# Patient Record
Sex: Female | Born: 1970 | Race: White | Hispanic: No | Marital: Married | Smoking: Current every day smoker
Health system: Southern US, Community
[De-identification: ages and names within clinical notes are randomized; demographics above are authoritative.]

## PROBLEM LIST (undated history)

## (undated) DIAGNOSIS — K3184 Gastroparesis: Secondary | ICD-10-CM

## (undated) DIAGNOSIS — M47816 Spondylosis without myelopathy or radiculopathy, lumbar region: Secondary | ICD-10-CM

## (undated) DIAGNOSIS — F411 Generalized anxiety disorder: Secondary | ICD-10-CM

## (undated) DIAGNOSIS — N879 Dysplasia of cervix uteri, unspecified: Secondary | ICD-10-CM

## (undated) DIAGNOSIS — F329 Major depressive disorder, single episode, unspecified: Secondary | ICD-10-CM

## (undated) DIAGNOSIS — K760 Fatty (change of) liver, not elsewhere classified: Secondary | ICD-10-CM

## (undated) DIAGNOSIS — R7303 Prediabetes: Secondary | ICD-10-CM

## (undated) DIAGNOSIS — F319 Bipolar disorder, unspecified: Secondary | ICD-10-CM

## (undated) DIAGNOSIS — Z87410 Personal history of cervical dysplasia: Secondary | ICD-10-CM

## (undated) DIAGNOSIS — N393 Stress incontinence (female) (male): Secondary | ICD-10-CM

## (undated) DIAGNOSIS — K912 Postsurgical malabsorption, not elsewhere classified: Secondary | ICD-10-CM

## (undated) DIAGNOSIS — K219 Gastro-esophageal reflux disease without esophagitis: Secondary | ICD-10-CM

## (undated) DIAGNOSIS — E559 Vitamin D deficiency, unspecified: Secondary | ICD-10-CM

## (undated) DIAGNOSIS — G5600 Carpal tunnel syndrome, unspecified upper limb: Secondary | ICD-10-CM

## (undated) DIAGNOSIS — E119 Type 2 diabetes mellitus without complications: Secondary | ICD-10-CM

## (undated) DIAGNOSIS — L309 Dermatitis, unspecified: Secondary | ICD-10-CM

## (undated) DIAGNOSIS — F419 Anxiety disorder, unspecified: Secondary | ICD-10-CM

## (undated) DIAGNOSIS — E288 Other ovarian dysfunction: Secondary | ICD-10-CM

## (undated) DIAGNOSIS — F321 Major depressive disorder, single episode, moderate: Secondary | ICD-10-CM

## (undated) DIAGNOSIS — K2270 Barrett's esophagus without dysplasia: Secondary | ICD-10-CM

## (undated) DIAGNOSIS — G43909 Migraine, unspecified, not intractable, without status migrainosus: Secondary | ICD-10-CM

## (undated) DIAGNOSIS — G4733 Obstructive sleep apnea (adult) (pediatric): Secondary | ICD-10-CM

## (undated) DIAGNOSIS — Z973 Presence of spectacles and contact lenses: Secondary | ICD-10-CM

## (undated) DIAGNOSIS — F603 Borderline personality disorder: Secondary | ICD-10-CM

## (undated) DIAGNOSIS — E039 Hypothyroidism, unspecified: Secondary | ICD-10-CM

## (undated) DIAGNOSIS — F3181 Bipolar II disorder: Secondary | ICD-10-CM

## (undated) DIAGNOSIS — M7071 Other bursitis of hip, right hip: Secondary | ICD-10-CM

## (undated) DIAGNOSIS — E2839 Other primary ovarian failure: Secondary | ICD-10-CM

## (undated) DIAGNOSIS — Z87412 Personal history of vulvar dysplasia: Secondary | ICD-10-CM

## (undated) DIAGNOSIS — M5136 Other intervertebral disc degeneration, lumbar region: Secondary | ICD-10-CM

## (undated) DIAGNOSIS — F4310 Post-traumatic stress disorder, unspecified: Secondary | ICD-10-CM

## (undated) HISTORY — DX: Migraine, unspecified, not intractable, without status migrainosus: G43.909

## (undated) HISTORY — DX: Barrett's esophagus without dysplasia: K22.70

## (undated) HISTORY — PX: CARPAL TUNNEL RELEASE: SHX101

## (undated) HISTORY — PX: STOMACH SURGERY: SHX791

## (undated) HISTORY — PX: LAPAROSCOPIC GASTRIC BAND REMOVAL WITH LAPAROSCOPIC GASTRIC SLEEVE RESECTION: SHX6498

## (undated) HISTORY — DX: Post-traumatic stress disorder, unspecified: F43.10

## (undated) HISTORY — DX: Carpal tunnel syndrome, unspecified upper limb: G56.00

## (undated) HISTORY — DX: Gastroparesis: K31.84

---

## 2004-11-21 ENCOUNTER — Emergency Department (HOSPITAL_COMMUNITY): Admission: EM | Admit: 2004-11-21 | Discharge: 2004-11-21 | Payer: Self-pay | Admitting: Emergency Medicine

## 2004-11-23 ENCOUNTER — Emergency Department: Payer: Self-pay | Admitting: Emergency Medicine

## 2004-12-07 ENCOUNTER — Emergency Department: Payer: Self-pay | Admitting: Emergency Medicine

## 2005-08-27 ENCOUNTER — Other Ambulatory Visit: Admission: RE | Admit: 2005-08-27 | Discharge: 2005-08-27 | Payer: Self-pay | Admitting: Family Medicine

## 2006-01-01 ENCOUNTER — Other Ambulatory Visit (HOSPITAL_COMMUNITY): Admission: RE | Admit: 2006-01-01 | Discharge: 2006-04-01 | Payer: Self-pay | Admitting: Psychiatry

## 2006-01-05 ENCOUNTER — Ambulatory Visit: Payer: Self-pay | Admitting: Psychiatry

## 2006-02-14 ENCOUNTER — Emergency Department: Payer: Self-pay | Admitting: Emergency Medicine

## 2006-02-24 ENCOUNTER — Other Ambulatory Visit: Admission: RE | Admit: 2006-02-24 | Discharge: 2006-02-24 | Payer: Self-pay | Admitting: Family Medicine

## 2006-03-14 ENCOUNTER — Emergency Department: Payer: Self-pay | Admitting: Emergency Medicine

## 2006-03-28 ENCOUNTER — Emergency Department: Payer: Self-pay | Admitting: Emergency Medicine

## 2006-03-30 ENCOUNTER — Emergency Department: Payer: Self-pay | Admitting: Emergency Medicine

## 2006-03-31 HISTORY — PX: OTHER SURGICAL HISTORY: SHX169

## 2006-06-25 ENCOUNTER — Other Ambulatory Visit (HOSPITAL_COMMUNITY): Admission: RE | Admit: 2006-06-25 | Discharge: 2006-09-23 | Payer: Self-pay | Admitting: Psychiatry

## 2006-06-29 ENCOUNTER — Ambulatory Visit: Payer: Self-pay | Admitting: Psychiatry

## 2006-08-03 ENCOUNTER — Other Ambulatory Visit: Admission: RE | Admit: 2006-08-03 | Discharge: 2006-08-03 | Payer: Self-pay | Admitting: Family Medicine

## 2006-08-20 ENCOUNTER — Ambulatory Visit (HOSPITAL_COMMUNITY): Admission: RE | Admit: 2006-08-20 | Discharge: 2006-08-20 | Payer: Self-pay | Admitting: Family Medicine

## 2006-10-26 ENCOUNTER — Other Ambulatory Visit (HOSPITAL_COMMUNITY): Admission: RE | Admit: 2006-10-26 | Discharge: 2006-11-11 | Payer: Self-pay | Admitting: Psychiatry

## 2006-10-27 ENCOUNTER — Ambulatory Visit: Payer: Self-pay | Admitting: Psychiatry

## 2006-11-16 ENCOUNTER — Emergency Department: Payer: Self-pay | Admitting: Emergency Medicine

## 2006-11-27 ENCOUNTER — Emergency Department: Payer: Self-pay | Admitting: Internal Medicine

## 2006-12-08 ENCOUNTER — Emergency Department: Payer: Self-pay | Admitting: Emergency Medicine

## 2007-01-03 ENCOUNTER — Emergency Department: Payer: Self-pay | Admitting: Emergency Medicine

## 2007-02-23 ENCOUNTER — Other Ambulatory Visit: Admission: RE | Admit: 2007-02-23 | Discharge: 2007-02-23 | Payer: Self-pay | Admitting: Family Medicine

## 2007-07-23 ENCOUNTER — Emergency Department: Payer: Self-pay | Admitting: Emergency Medicine

## 2007-07-24 ENCOUNTER — Emergency Department: Payer: Self-pay | Admitting: Emergency Medicine

## 2007-08-24 ENCOUNTER — Other Ambulatory Visit: Admission: RE | Admit: 2007-08-24 | Discharge: 2007-08-24 | Payer: Self-pay | Admitting: Family Medicine

## 2007-08-27 ENCOUNTER — Emergency Department (HOSPITAL_COMMUNITY): Admission: EM | Admit: 2007-08-27 | Discharge: 2007-08-27 | Payer: Self-pay | Admitting: Emergency Medicine

## 2007-09-05 ENCOUNTER — Encounter: Admission: RE | Admit: 2007-09-05 | Discharge: 2007-09-05 | Payer: Self-pay | Admitting: Family Medicine

## 2008-01-09 ENCOUNTER — Emergency Department: Payer: Self-pay | Admitting: Emergency Medicine

## 2008-02-28 ENCOUNTER — Other Ambulatory Visit: Admission: RE | Admit: 2008-02-28 | Discharge: 2008-02-28 | Payer: Self-pay | Admitting: Family Medicine

## 2008-07-22 ENCOUNTER — Emergency Department: Payer: Self-pay | Admitting: Emergency Medicine

## 2008-10-11 ENCOUNTER — Other Ambulatory Visit: Admission: RE | Admit: 2008-10-11 | Discharge: 2008-10-11 | Payer: Self-pay | Admitting: Obstetrics and Gynecology

## 2008-11-30 ENCOUNTER — Emergency Department (HOSPITAL_COMMUNITY): Admission: EM | Admit: 2008-11-30 | Discharge: 2008-12-01 | Payer: Self-pay | Admitting: Emergency Medicine

## 2009-03-31 HISTORY — PX: CHOLECYSTECTOMY: SHX55

## 2009-04-12 ENCOUNTER — Other Ambulatory Visit: Admission: RE | Admit: 2009-04-12 | Discharge: 2009-04-12 | Payer: Self-pay | Admitting: Obstetrics and Gynecology

## 2009-04-13 ENCOUNTER — Ambulatory Visit: Payer: Self-pay | Admitting: Psychology

## 2009-04-23 ENCOUNTER — Encounter: Admission: RE | Admit: 2009-04-23 | Discharge: 2009-04-23 | Payer: Self-pay | Admitting: Family Medicine

## 2009-06-26 ENCOUNTER — Emergency Department: Payer: Self-pay | Admitting: Emergency Medicine

## 2009-10-08 ENCOUNTER — Emergency Department: Payer: Self-pay | Admitting: Emergency Medicine

## 2009-12-17 ENCOUNTER — Emergency Department: Payer: Self-pay | Admitting: Emergency Medicine

## 2010-01-03 ENCOUNTER — Emergency Department: Payer: Self-pay | Admitting: Emergency Medicine

## 2010-01-24 ENCOUNTER — Other Ambulatory Visit: Admission: RE | Admit: 2010-01-24 | Discharge: 2010-01-24 | Payer: Self-pay | Admitting: Obstetrics and Gynecology

## 2010-02-12 ENCOUNTER — Emergency Department: Payer: Self-pay | Admitting: Emergency Medicine

## 2010-02-27 ENCOUNTER — Ambulatory Visit: Payer: Self-pay | Admitting: Surgery

## 2010-02-28 HISTORY — PX: CHOLECYSTECTOMY, LAPAROSCOPIC: SHX56

## 2010-03-06 ENCOUNTER — Ambulatory Visit: Payer: Self-pay | Admitting: Surgery

## 2010-03-11 ENCOUNTER — Ambulatory Visit: Payer: Self-pay | Admitting: Surgery

## 2010-04-21 ENCOUNTER — Encounter: Payer: Self-pay | Admitting: Family Medicine

## 2010-05-21 ENCOUNTER — Other Ambulatory Visit: Payer: Self-pay | Admitting: Family Medicine

## 2010-05-21 DIAGNOSIS — E049 Nontoxic goiter, unspecified: Secondary | ICD-10-CM

## 2010-05-24 ENCOUNTER — Other Ambulatory Visit: Payer: Self-pay | Admitting: Obstetrics and Gynecology

## 2010-05-24 ENCOUNTER — Other Ambulatory Visit (HOSPITAL_COMMUNITY)
Admission: RE | Admit: 2010-05-24 | Discharge: 2010-05-24 | Disposition: A | Discharge status: Home / Self Care | Payer: Medicare Other | Source: Ambulatory Visit | Attending: Obstetrics and Gynecology | Admitting: Obstetrics and Gynecology

## 2010-05-24 DIAGNOSIS — Z124 Encounter for screening for malignant neoplasm of cervix: Secondary | ICD-10-CM | POA: Insufficient documentation

## 2010-05-24 DIAGNOSIS — R87810 Cervical high risk human papillomavirus (HPV) DNA test positive: Secondary | ICD-10-CM | POA: Insufficient documentation

## 2010-05-27 ENCOUNTER — Ambulatory Visit
Admission: RE | Admit: 2010-05-27 | Discharge: 2010-05-27 | Disposition: A | Discharge status: Home / Self Care | Payer: Medicare Other | Source: Ambulatory Visit | Attending: Family Medicine | Admitting: Family Medicine

## 2010-05-27 DIAGNOSIS — E049 Nontoxic goiter, unspecified: Secondary | ICD-10-CM

## 2010-07-05 LAB — DIFFERENTIAL
Eosinophils Absolute: 0.3 10*3/uL (ref 0.0–0.7)
Eosinophils Relative: 3 % (ref 0–5)
Lymphocytes Relative: 16 % (ref 12–46)
Lymphs Abs: 1.5 10*3/uL (ref 0.7–4.0)
Monocytes Relative: 7 % (ref 3–12)

## 2010-07-05 LAB — RAPID URINE DRUG SCREEN, HOSP PERFORMED
Amphetamines: NOT DETECTED
Benzodiazepines: POSITIVE — AB
Opiates: NOT DETECTED

## 2010-07-05 LAB — POCT PREGNANCY, URINE: Preg Test, Ur: NEGATIVE

## 2010-07-05 LAB — URINALYSIS, ROUTINE W REFLEX MICROSCOPIC
Nitrite: NEGATIVE
Protein, ur: NEGATIVE mg/dL
Specific Gravity, Urine: 1.003 — ABNORMAL LOW (ref 1.005–1.030)
Urobilinogen, UA: 0.2 mg/dL (ref 0.0–1.0)

## 2010-07-05 LAB — BASIC METABOLIC PANEL
BUN: 6 mg/dL (ref 6–23)
CO2: 30 mEq/L (ref 19–32)
Calcium: 8.8 mg/dL (ref 8.4–10.5)
Creatinine, Ser: 0.74 mg/dL (ref 0.4–1.2)
Glucose, Bld: 78 mg/dL (ref 70–99)

## 2010-07-05 LAB — CBC
HCT: 37 % (ref 36.0–46.0)
MCV: 91.2 fL (ref 78.0–100.0)
Platelets: 331 10*3/uL (ref 150–400)

## 2010-07-05 LAB — ETHANOL: Alcohol, Ethyl (B): <5 mg/dL (ref 0–10)

## 2010-07-29 ENCOUNTER — Other Ambulatory Visit: Payer: Self-pay | Admitting: Obstetrics and Gynecology

## 2010-07-30 DIAGNOSIS — K760 Fatty (change of) liver, not elsewhere classified: Secondary | ICD-10-CM

## 2010-07-30 HISTORY — DX: Fatty (change of) liver, not elsewhere classified: K76.0

## 2010-10-16 ENCOUNTER — Encounter (HOSPITAL_COMMUNITY): Payer: Self-pay | Admitting: *Deleted

## 2010-10-17 ENCOUNTER — Other Ambulatory Visit: Payer: Self-pay | Admitting: Obstetrics and Gynecology

## 2010-10-17 ENCOUNTER — Ambulatory Visit (HOSPITAL_COMMUNITY): Payer: Medicare Other | Admitting: Certified Registered Nurse Anesthetist

## 2010-10-17 ENCOUNTER — Encounter (HOSPITAL_COMMUNITY)
Admission: RE | Disposition: A | Discharge status: Home / Self Care | Payer: Self-pay | Source: Ambulatory Visit | Attending: Obstetrics and Gynecology

## 2010-10-17 ENCOUNTER — Ambulatory Visit (HOSPITAL_COMMUNITY)
Admission: RE | Admit: 2010-10-17 | Discharge: 2010-10-17 | Disposition: A | Discharge status: Home / Self Care | Payer: Medicare Other | Source: Ambulatory Visit | Attending: Obstetrics and Gynecology | Admitting: Obstetrics and Gynecology

## 2010-10-17 ENCOUNTER — Encounter (HOSPITAL_COMMUNITY): Payer: Self-pay | Admitting: Certified Registered Nurse Anesthetist

## 2010-10-17 DIAGNOSIS — N950 Postmenopausal bleeding: Secondary | ICD-10-CM | POA: Insufficient documentation

## 2010-10-17 DIAGNOSIS — Z9889 Other specified postprocedural states: Secondary | ICD-10-CM

## 2010-10-17 DIAGNOSIS — E2839 Other primary ovarian failure: Secondary | ICD-10-CM | POA: Insufficient documentation

## 2010-10-17 HISTORY — DX: Gastro-esophageal reflux disease without esophagitis: K21.9

## 2010-10-17 HISTORY — DX: Hypothyroidism, unspecified: E03.9

## 2010-10-17 HISTORY — PX: HYSTEROSCOPY W/D&C: SHX1775

## 2010-10-17 HISTORY — DX: Anxiety disorder, unspecified: F41.9

## 2010-10-17 LAB — URINALYSIS, MICROSCOPIC ONLY
Hgb urine dipstick: NEGATIVE
Ketones, ur: 15 mg/dL — AB
Protein, ur: 100 mg/dL — AB
Specific Gravity, Urine: >1.03 — ABNORMAL HIGH (ref 1.005–1.030)
Urobilinogen, UA: 1 mg/dL (ref 0.0–1.0)

## 2010-10-17 LAB — CBC
MCH: 29.3 pg (ref 26.0–34.0)
MCHC: 33 g/dL (ref 30.0–36.0)
MCV: 88.8 fL (ref 78.0–100.0)
Platelets: 411 10*3/uL — ABNORMAL HIGH (ref 150–400)
WBC: 10.4 10*3/uL (ref 4.0–10.5)

## 2010-10-17 LAB — BASIC METABOLIC PANEL
BUN: 9 mg/dL (ref 6–23)
CO2: 20 mEq/L (ref 19–32)
Calcium: 9.7 mg/dL (ref 8.4–10.5)
Creatinine, Ser: 1.01 mg/dL (ref 0.50–1.10)
GFR calc Af Amer: >60 mL/min (ref >60)

## 2010-10-17 LAB — PREGNANCY, URINE: Preg Test, Ur: NEGATIVE

## 2010-10-17 SURGERY — DILATATION AND CURETTAGE /HYSTEROSCOPY
Anesthesia: Choice

## 2010-10-17 MED ORDER — FENTANYL CITRATE 0.05 MG/ML IJ SOLN
INTRAMUSCULAR | Status: AC
  Administered 2010-10-17: 50 ug via INTRAVENOUS
  Filled 2010-10-17: qty 2

## 2010-10-17 MED ORDER — LACTATED RINGERS IV SOLN
INTRAVENOUS | Status: DC
  Administered 2010-10-17: 13:00:00 via INTRAVENOUS

## 2010-10-17 MED ORDER — IBUPROFEN 600 MG PO TABS: 600.0000 mg | ORAL_TABLET | Freq: Four times a day (QID) | ORAL | Status: AC | PRN

## 2010-10-17 MED ORDER — DEXAMETHASONE SODIUM PHOSPHATE 10 MG/ML IJ SOLN
INTRAMUSCULAR | Status: AC
  Filled 2010-10-17: qty 1

## 2010-10-17 MED ORDER — DEXAMETHASONE SODIUM PHOSPHATE 10 MG/ML IJ SOLN
INTRAMUSCULAR | Status: DC | PRN
  Administered 2010-10-17: 10 mg via INTRAVENOUS

## 2010-10-17 MED ORDER — BUPIVACAINE HCL (PF) 0.25 % IJ SOLN
INTRAMUSCULAR | Status: DC | PRN
  Administered 2010-10-17: 20 mL

## 2010-10-17 MED ORDER — LIDOCAINE HCL (CARDIAC) 20 MG/ML IV SOLN
INTRAVENOUS | Status: AC
  Filled 2010-10-17: qty 5

## 2010-10-17 MED ORDER — PROMETHAZINE HCL 25 MG/ML IJ SOLN: 6.2500 mg | INTRAMUSCULAR | Status: DC | PRN

## 2010-10-17 MED ORDER — MIDAZOLAM HCL 2 MG/2ML IJ SOLN
INTRAMUSCULAR | Status: AC
  Filled 2010-10-17: qty 2

## 2010-10-17 MED ORDER — KETOROLAC TROMETHAMINE 30 MG/ML IJ SOLN
INTRAMUSCULAR | Status: DC | PRN
  Administered 2010-10-17: 30 mg via INTRAVENOUS

## 2010-10-17 MED ORDER — ACETAMINOPHEN 325 MG PO TABS: 325.0000 mg | ORAL_TABLET | ORAL | Status: DC | PRN

## 2010-10-17 MED ORDER — GLYCINE 1.5 % IR SOLN
Status: DC | PRN
  Administered 2010-10-17: 3000 mL

## 2010-10-17 MED ORDER — ONDANSETRON HCL 4 MG/2ML IJ SOLN
INTRAMUSCULAR | Status: DC | PRN
  Administered 2010-10-17: 4 mg via INTRAVENOUS

## 2010-10-17 MED ORDER — ACETAMINOPHEN 10 MG/ML IV SOLN: 1000.0000 mg | Freq: Once | INTRAVENOUS | Status: DC | PRN

## 2010-10-17 MED ORDER — FENTANYL CITRATE 0.05 MG/ML IJ SOLN
INTRAMUSCULAR | Status: AC
  Filled 2010-10-17: qty 2

## 2010-10-17 MED ORDER — PROPOFOL 10 MG/ML IV EMUL
INTRAVENOUS | Status: AC
Start: 2010-10-17 — End: 2010-10-17
  Filled 2010-10-17: qty 20

## 2010-10-17 MED ORDER — PROPOFOL 10 MG/ML IV EMUL
INTRAVENOUS | Status: DC | PRN
  Administered 2010-10-17: 200 mg via INTRAVENOUS

## 2010-10-17 MED ORDER — FENTANYL CITRATE 0.05 MG/ML IJ SOLN
50.0000 ug | INTRAMUSCULAR | Status: DC | PRN
  Administered 2010-10-17: 50 ug via INTRAVENOUS

## 2010-10-17 MED ORDER — MIDAZOLAM HCL 5 MG/5ML IJ SOLN
INTRAMUSCULAR | Status: DC | PRN
  Administered 2010-10-17: 2 mg via INTRAVENOUS

## 2010-10-17 MED ORDER — LIDOCAINE HCL (CARDIAC) 20 MG/ML IV SOLN
INTRAVENOUS | Status: DC | PRN
  Administered 2010-10-17: 100 mg via INTRAVENOUS

## 2010-10-17 MED ORDER — HYDROMORPHONE HCL 1 MG/ML IJ SOLN
INTRAMUSCULAR | Status: AC
  Filled 2010-10-17: qty 1

## 2010-10-17 MED ORDER — FENTANYL CITRATE 0.05 MG/ML IJ SOLN
INTRAMUSCULAR | Status: DC | PRN
  Administered 2010-10-17: 100 ug via INTRAVENOUS

## 2010-10-17 MED ORDER — ONDANSETRON HCL 4 MG/2ML IJ SOLN
INTRAMUSCULAR | Status: AC
Start: 2010-10-17 — End: 2010-10-17
  Filled 2010-10-17: qty 2

## 2010-10-17 MED ORDER — FAMOTIDINE IN NACL 20-0.9 MG/50ML-% IV SOLN
20.0000 mg | Freq: Two times a day (BID) | INTRAVENOUS | Status: DC
  Administered 2010-10-17: 20 mg via INTRAVENOUS
  Filled 2010-10-17 (×3): qty 50

## 2010-10-17 SURGICAL SUPPLY — 20 items
CANISTER SUCTION 2500CC (MISCELLANEOUS) ×2 IMPLANT
CATH FOLEY 2WAY  5CC 16FR SIL (CATHETERS) ×1
CATH FOLEY 2WAY 5CC 16FR SIL (CATHETERS) ×1 IMPLANT
CATH ROBINSON RED A/P 16FR (CATHETERS) IMPLANT
CLOTH BEACON ORANGE TIMEOUT ST (SAFETY) ×2 IMPLANT
CONTAINER PREFILL 10% NBF 60ML (FORM) ×2 IMPLANT
DRAPE UTILITY XL STRL (DRAPES) ×2 IMPLANT
ELECT REM PT RETURN 9FT ADLT (ELECTROSURGICAL) ×2
ELECTRODE REM PT RTRN 9FT ADLT (ELECTROSURGICAL) ×1 IMPLANT
ELECTRODE ROLLER BARREL 22FR (ELECTROSURGICAL) IMPLANT
ELECTRODE VAPORCUT 22FR (ELECTROSURGICAL) IMPLANT
GLOVE BIOGEL M 6.5 STRL (GLOVE) ×2 IMPLANT
GLOVE BIOGEL PI IND STRL 6.5 (GLOVE) ×2 IMPLANT
GLOVE BIOGEL PI INDICATOR 6.5 (GLOVE) ×2
GOWN BRE IMP SLV AUR LG STRL (GOWN DISPOSABLE) ×2 IMPLANT
GOWN BRE IMP SLV AUR XL STRL (GOWN DISPOSABLE) ×2 IMPLANT
LOOP ANGLED CUTTING 22FR (CUTTING LOOP) IMPLANT
PACK HYSTEROSCOPY LF (CUSTOM PROCEDURE TRAY) ×2 IMPLANT
TOWEL OR 17X24 6PK STRL BLUE (TOWEL DISPOSABLE) ×4 IMPLANT
WATER STERILE IRR 1000ML POUR (IV SOLUTION) IMPLANT

## 2010-10-17 NOTE — Op Note (Signed)
Date of surgery 10/17/2010  Procedure hysteroscopy dilation and curettage Preoperative diagnoses #1 postmenopausal bleeding #2 premature ovarian failure Postoperative diagnoses #1 same Surgeon Dr. Richardson Dopp Asst. none Anesthesia Gen. Findings normal-appearing endometrial cavity no masses noted Specimen endometrial curettings Disposition of specimen pathology  Procedure Patient was taken to the operating room. Where she was placed under general anesthesia. She was prepped and draped in the usual sterile fashion. A speculum was placed into the vaginal vault. The anterior lip of the cervix was grasped with a single-tooth tenaculum. The uterus was then sounded to 7.5 cm. The cervix was then dilated to approximately 6 mm. Diagnostic hysteroscope was inserted. The findings on above were noted. The hysteroscope was removed. A sharp curet was introduced and a sharp curettage was performed until a gritty texture was noted. Hysteroscope was then reintroduced. There was no evidence of perforation. Hysteroscope was removed. The speculum was removed. Excellent hemostasis was noted. Patient was awakened from anesthesia and taken to the recovery room in stable condition.  Postoperative counts were correct x2.  Complications none Glycine deficit 75 cc Estimated blood loss minimal Urine output approximately 25 cc in and out catheterized at the beginning of the case

## 2010-10-17 NOTE — Progress Notes (Signed)
Pt complains of chest pressure, sats 100%, sinus rhythm at 87, bp 107/66, pt appears anxious, pt has no cardiac hx, or diabetes, Dr Cristela Blue made aware, no new orders will continue to monitor closely

## 2010-10-17 NOTE — Anesthesia Preprocedure Evaluation (Addendum)
Anesthesia Evaluation  Name, MR# and DOB Patient awake  General Assessment Comment  Reviewed: Allergy & Precautions, H&P , Patient's Chart, lab work & pertinent test results and reviewed documented beta blocker date and time   History of Anesthesia Complications Negative for: history of anesthetic complications  Airway Mallampati: III TM Distance: >3 FB Neck ROM: full    Dental No notable dental hx    Pulmonaryneg pulmonary ROS    clear to auscultation  pulmonary exam normal   Cardiovascular Exercise Tolerance: Good regular Normal   Neuro/Psych (+) {AN ROS/MED HX NEURO HEADACHES (+) PSYCHIATRIC DISORDERS, Anxiety, Depression, Negative Neurological ROS Negative Psych ROS  GI/Hepatic/Renal negative GI ROS, negative Liver ROS, and negative Renal ROS (+)  GERD Medicated and Controlled     Endo/Other  Negative Endocrine ROS (+)  Hypothyroidism, Morbid obesity Abdominal   Musculoskeletal  Hematology negative hematology ROS (+)   Peds  Reproductive/Obstetrics negative OB ROS   Anesthesia Other Findings             Anesthesia Physical Anesthesia Plan  ASA: III  Anesthesia Plan: General   Post-op Pain Management:    Induction:   Airway Management Planned:   Additional Equipment:   Intra-op Plan:   Post-operative Plan:   Informed Consent: I have reviewed the patients History and Physical, chart, labs and discussed the procedure including the risks, benefits and alternatives for the proposed anesthesia with the patient or authorized representative who has indicated his/her understanding and acceptance.   Dental Advisory Given  Plan Discussed with: CRNA and Surgeon  Anesthesia Plan Comments:         Anesthesia Quick Evaluation

## 2010-10-17 NOTE — Transfer of Care (Signed)
Immediate Anesthesia Transfer of Care Note  Patient: Lindsey Nicholson  Procedure(s) Performed:  DILATATION AND CURETTAGE (D&C) /HYSTEROSCOPY  Patient Location: PACU  Anesthesia Type: General  Level of Consciousness: awake, alert  and oriented  Airway & Oxygen Therapy: Patient Spontanous Breathing and Patient connected to nasal cannula oxygen  Post-op Assessment: Report given to PACU RN, Post -op Vital signs reviewed and stable and Patient moving all extremities X 4  Post vital signs: Reviewed and stable  Complications: No apparent anesthesia complications

## 2010-10-17 NOTE — Preoperative (Addendum)
Beta Blockers   Reason not to administer Beta Blockers:Not Applicable, Not ordered pre-op

## 2010-10-17 NOTE — Anesthesia Postprocedure Evaluation (Signed)
  Anesthesia Post-op Note  Patient: Lindsey Nicholson Section  Procedure(s) Performed:  DILATATION AND CURETTAGE (D&C) /HYSTEROSCOPY Patient is awake and responsive. Pain and nausea are reasonably well controlled. Vital signs are stable and clinically acceptable. Oxygen saturation is clinically acceptable. There are no apparent anesthetic complications at this time. Patient is ready for discharge.

## 2010-10-23 ENCOUNTER — Inpatient Hospital Stay (HOSPITAL_COMMUNITY)
Admission: AD | Admit: 2010-10-23 | Discharge: 2010-10-23 | Disposition: A | Discharge status: Home / Self Care | Payer: Medicare Other | Source: Ambulatory Visit | Attending: Obstetrics and Gynecology | Admitting: Obstetrics and Gynecology

## 2010-10-23 ENCOUNTER — Encounter (HOSPITAL_COMMUNITY): Payer: Self-pay

## 2010-10-23 DIAGNOSIS — Z79899 Other long term (current) drug therapy: Secondary | ICD-10-CM | POA: Insufficient documentation

## 2010-10-23 DIAGNOSIS — K5900 Constipation, unspecified: Secondary | ICD-10-CM | POA: Insufficient documentation

## 2010-10-23 DIAGNOSIS — E039 Hypothyroidism, unspecified: Secondary | ICD-10-CM | POA: Insufficient documentation

## 2010-10-23 DIAGNOSIS — K219 Gastro-esophageal reflux disease without esophagitis: Secondary | ICD-10-CM | POA: Insufficient documentation

## 2010-10-23 DIAGNOSIS — F17200 Nicotine dependence, unspecified, uncomplicated: Secondary | ICD-10-CM | POA: Insufficient documentation

## 2010-10-23 DIAGNOSIS — R1031 Right lower quadrant pain: Secondary | ICD-10-CM | POA: Insufficient documentation

## 2010-10-23 DIAGNOSIS — F319 Bipolar disorder, unspecified: Secondary | ICD-10-CM | POA: Insufficient documentation

## 2010-10-23 DIAGNOSIS — R110 Nausea: Secondary | ICD-10-CM | POA: Insufficient documentation

## 2010-10-23 HISTORY — DX: Borderline personality disorder: F60.3

## 2010-10-23 HISTORY — DX: Bipolar disorder, unspecified: F31.9

## 2010-10-23 LAB — CBC
HCT: 40.5 % (ref 36.0–46.0)
MCHC: 33.3 g/dL (ref 30.0–36.0)
Platelets: 311 10*3/uL (ref 150–400)
RDW: 13.3 % (ref 11.5–15.5)

## 2010-10-23 LAB — DIFFERENTIAL
Basophils Absolute: 0 10*3/uL (ref 0.0–0.1)
Basophils Relative: 0 % (ref 0–1)
Eosinophils Relative: 5 % (ref 0–5)
Monocytes Absolute: 0.7 10*3/uL (ref 0.1–1.0)
Neutro Abs: 6.5 10*3/uL (ref 1.7–7.7)

## 2010-10-23 LAB — URINALYSIS, ROUTINE W REFLEX MICROSCOPIC
Glucose, UA: NEGATIVE mg/dL
Leukocytes, UA: NEGATIVE
Protein, ur: NEGATIVE mg/dL
Specific Gravity, Urine: 1.015 (ref 1.005–1.030)
pH: 6 (ref 5.0–8.0)

## 2010-10-23 LAB — POCT PREGNANCY, URINE: Preg Test, Ur: NEGATIVE

## 2010-10-23 LAB — URINE MICROSCOPIC-ADD ON

## 2010-10-23 NOTE — ED Provider Notes (Addendum)
History    patient is a 40 year old white female. She presents today complaining of right lower quadrant pain since her D&C and hysteroscopy on July 19. She states she has not had a bowel movement since that time. She denies fever. She has had some nausea.  Chief Complaint  Patient presents with  . Abdominal Pain   HPI  OB History    Grav Para Term Preterm Abortions TAB SAB Ect Mult Living                  Past Medical History  Diagnosis Date  . Hypothyroidism   . GERD (gastroesophageal reflux disease)     frequently but not on meds  . Headache     has been a long time  . Anxiety   . Depression 2003    bi-polar  . 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 Bipolar 1 disorder   . Borderline personality disorder     Past Surgical History  Procedure Date  . Cholecystectomy 2011  . Fracture surgery 2008    left foot  . Hysteroscopy w/d&c     Family History  Problem Relation Age of Onset  . Cancer Mother   . Mental illness Mother   . Diabetes Father     History  Substance Use Topics  . Smoking status: Current Everyday Smoker -- 0.5 packs/day for 15 years    Types: Cigarettes  . Smokeless tobacco: Not on file  . Alcohol Use: No    Allergies:  Allergies  Allergen Reactions  . Codeine Anaphylaxis  . Hydrocodone Anaphylaxis  . Oxycodone Anaphylaxis  . Trazodone And Nefazodone Other (See Comments)    Blurry vision and then passed out at work, had to go to ER  . Latex Rash    Prescriptions prior to admission  Medication Sig Dispense Refill  . buPROPion (WELLBUTRIN XL) 300 MG 24 hr tablet Take 300 mg by mouth daily.        . calcium carbonate (TUMS EX) 750 MG chewable tablet Chew 1 tablet by mouth daily.        . Cholecalciferol 50000 UNITS capsule Take 50,000 Units by mouth every Wednesday.        . clobetasol (TEMOVATE) 0.05 % ointment Apply 1 application topically 2 (two) times daily.        . diazepam (VALIUM) 10 MG tablet Take  10 mg by mouth every 6 (six) hours as needed. For anxiety.       . DULoxetine (CYMBALTA) 60 MG capsule Take 60 mg by mouth daily.        . Emollient (CERAVE EX) Apply 1 application topically daily.        Marland Kitchen ibuprofen (IBU) 600 MG tablet Take 1 tablet (600 mg total) by mouth every 6 (six) hours as needed for pain.  30 tablet  1  . ketoconazole (NIZORAL) 2 % cream Apply 1 application topically daily.        Marland Kitchen levothyroxine (SYNTHROID, LEVOTHROID) 175 MCG tablet Take 175 mcg by mouth daily.        . polyethylene glycol powder (MIRALAX) powder Take 17 g by mouth once.        Marland Kitchen QUEtiapine (SEROQUEL) 100 MG tablet Take 100 mg by mouth at bedtime.          Review of Systems  Constitutional: Negative for fever and chills.  Gastrointestinal: Positive for nausea and abdominal pain. Negative for vomiting, diarrhea  and constipation.  Genitourinary: Negative for dysuria, urgency, frequency, hematuria and flank pain.  Neurological: Negative for dizziness and headaches.  Psychiatric/Behavioral: Negative for depression and suicidal ideas.   Physical Exam   Blood pressure 134/94, pulse 98, temperature 99.1 F (37.3 C), temperature source Oral, resp. rate 20, SpO2 99.00%.  Physical Exam  Constitutional: She is oriented to person, place, and time. She appears well-developed and well-nourished. No distress.  HENT:  Head: Normocephalic and atraumatic.  Eyes: EOM are normal. Pupils are equal, round, and reactive to light.  GI: Soft. She exhibits no distension. There is tenderness. There is no rebound and no guarding.  Neurological: She is alert and oriented to person, place, and time.  Skin: Skin is warm and dry. She is not diaphoretic.  Psychiatric: She has a normal mood and affect. Her behavior is normal. Judgment and thought content normal.    MAU Course  Procedures  Patient had a soapsuds enema. She had great results.  Results for orders placed during the hospital encounter of 10/23/10 (from the  past 24 hour(s))  URINALYSIS, ROUTINE W REFLEX MICROSCOPIC     Status: Abnormal   Collection Time   10/23/10  8:40 AM      Component Value Range   Color, Urine YELLOW  YELLOW    Appearance CLEAR  CLEAR    Specific Gravity, Urine 1.015  1.005 - 1.030    pH 6.0  5.0 - 8.0    Glucose, UA NEGATIVE  NEGATIVE (mg/dL)   Hgb urine dipstick TRACE (*) NEGATIVE    Bilirubin Urine NEGATIVE  NEGATIVE    Ketones, ur NEGATIVE  NEGATIVE (mg/dL)   Protein, ur NEGATIVE  NEGATIVE (mg/dL)   Urobilinogen, UA 0.2  0.0 - 1.0 (mg/dL)   Nitrite NEGATIVE  NEGATIVE    Leukocytes, UA NEGATIVE  NEGATIVE   URINE MICROSCOPIC-ADD ON     Status: Normal   Collection Time   10/23/10  8:40 AM      Component Value Range   Squamous Epithelial / LPF RARE  RARE    RBC / HPF 0-2  <3 (RBC/hpf)   Bacteria, UA RARE  RARE   POCT PREGNANCY, URINE     Status: Normal   Collection Time   10/23/10  8:50 AM      Component Value Range   Preg Test, Ur NEGATIVE    CBC     Status: Normal   Collection Time   10/23/10 10:04 AM      Component Value Range   WBC 9.4  4.0 - 10.5 (K/uL)   RBC 4.55  3.87 - 5.11 (MIL/uL)   Hemoglobin 13.5  12.0 - 15.0 (g/dL)   HCT 91.4  78.2 - 95.6 (%)   MCV 89.0  78.0 - 100.0 (fL)   MCH 29.7  26.0 - 34.0 (pg)   MCHC 33.3  30.0 - 36.0 (g/dL)   RDW 21.3  08.6 - 57.8 (%)   Platelets 311  150 - 400 (K/uL)  DIFFERENTIAL     Status: Normal   Collection Time   10/23/10 10:04 AM      Component Value Range   Neutrophils Relative 68  43 - 77 (%)   Neutro Abs 6.5  1.7 - 7.7 (K/uL)   Lymphocytes Relative 19  12 - 46 (%)   Lymphs Abs 1.8  0.7 - 4.0 (K/uL)   Monocytes Relative 8  3 - 12 (%)   Monocytes Absolute 0.7  0.1 - 1.0 (K/uL)   Eosinophils  Relative 5  0 - 5 (%)   Eosinophils Absolute 0.4  0.0 - 0.7 (K/uL)   Basophils Relative 0  0 - 1 (%)   Basophils Absolute 0.0  0.0 - 0.1 (K/uL)     Assessment and Plan  Constipation: Discussed with patient at length. She has a followup scheduled with Dr. Richardson Dopp.  Dr. Richardson Dopp actually did see this patient today in the MAU. Did discuss appropriate diet, activities, risks, and precautions. She understood this and agreed.  Clinton Gallant. Lindon Kiel III, DrHSc, MPAS, PA-C  10/23/2010, 12:31 PM

## 2010-10-23 NOTE — Progress Notes (Signed)
UP TO B-ROOM- LARGE RESULTS-  THICK BROWN WATERY

## 2010-10-23 NOTE — Progress Notes (Signed)
AGAIN - LARGE AMTS BROWN LIQ  WATERY  BM

## 2010-10-23 NOTE — Progress Notes (Signed)
PT SAYS SHE HAD SURGERY ON 10-17-2010- AND AFTER PAIN MEDS WORE OFF - HER ABD CONTINUED TO HURT.   DREW LABS TODAY. HAS HAD SMALL BM'S - SOME AT 0300.

## 2010-10-23 NOTE — Progress Notes (Signed)
UP TO B-ROOM- WITH GOOD RESULTS-  POST BS IN LOWER QUADS - NONE IN UPPER QUADS.

## 2010-10-23 NOTE — Progress Notes (Signed)
Pt states she had a D & C with a hysteroscopy on 7-19. Has been having R side sharp pain since surgery. Pt has not had a BM since surgery except for hard balls. Has taken multiple softners without results. Had N/V this am.

## 2010-10-24 ENCOUNTER — Encounter (HOSPITAL_COMMUNITY): Payer: Self-pay

## 2010-10-24 ENCOUNTER — Inpatient Hospital Stay (HOSPITAL_COMMUNITY)
Admission: AD | Admit: 2010-10-24 | Discharge: 2010-10-24 | Disposition: A | Discharge status: Home / Self Care | Payer: Medicare Other | Source: Ambulatory Visit | Attending: Obstetrics and Gynecology | Admitting: Obstetrics and Gynecology

## 2010-10-24 DIAGNOSIS — F411 Generalized anxiety disorder: Secondary | ICD-10-CM | POA: Insufficient documentation

## 2010-10-24 DIAGNOSIS — K219 Gastro-esophageal reflux disease without esophagitis: Secondary | ICD-10-CM | POA: Insufficient documentation

## 2010-10-24 DIAGNOSIS — R109 Unspecified abdominal pain: Secondary | ICD-10-CM

## 2010-10-24 DIAGNOSIS — K5900 Constipation, unspecified: Secondary | ICD-10-CM | POA: Insufficient documentation

## 2010-10-24 DIAGNOSIS — F319 Bipolar disorder, unspecified: Secondary | ICD-10-CM | POA: Insufficient documentation

## 2010-10-24 DIAGNOSIS — R1031 Right lower quadrant pain: Secondary | ICD-10-CM | POA: Insufficient documentation

## 2010-10-24 DIAGNOSIS — Z79899 Other long term (current) drug therapy: Secondary | ICD-10-CM | POA: Insufficient documentation

## 2010-10-24 DIAGNOSIS — E039 Hypothyroidism, unspecified: Secondary | ICD-10-CM | POA: Insufficient documentation

## 2010-10-24 DIAGNOSIS — F17200 Nicotine dependence, unspecified, uncomplicated: Secondary | ICD-10-CM | POA: Insufficient documentation

## 2010-10-24 HISTORY — DX: Other primary ovarian failure: E28.39

## 2010-10-24 HISTORY — DX: Other ovarian dysfunction: E28.8

## 2010-10-24 HISTORY — DX: Fatty (change of) liver, not elsewhere classified: K76.0

## 2010-10-24 LAB — DIFFERENTIAL
Basophils Absolute: 0 10*3/uL (ref 0.0–0.1)
Basophils Relative: 0 % (ref 0–1)
Neutro Abs: 7 10*3/uL (ref 1.7–7.7)
Neutrophils Relative %: 73 % (ref 43–77)

## 2010-10-24 LAB — URINALYSIS, ROUTINE W REFLEX MICROSCOPIC
Leukocytes, UA: NEGATIVE
Nitrite: NEGATIVE
Specific Gravity, Urine: 1.02 (ref 1.005–1.030)
Urobilinogen, UA: 0.2 mg/dL (ref 0.0–1.0)

## 2010-10-24 LAB — URINE MICROSCOPIC-ADD ON

## 2010-10-24 LAB — CBC
Hemoglobin: 13.7 g/dL (ref 12.0–15.0)
MCHC: 33.2 g/dL (ref 30.0–36.0)
RDW: 13.6 % (ref 11.5–15.5)
WBC: 9.7 10*3/uL (ref 4.0–10.5)

## 2010-10-24 NOTE — ED Provider Notes (Signed)
History   Pt presents today c/o continued Rt sided pain. She is 1wk s/p D&C and hysteroscopy. She was seen yesterday in the MAU and was given a soap suds enema which gave her complete relief of her sx. She has an appt today with Dr. Richardson Dopp at 3pm. She also had an [place] earlier today at Dr. Dawayne Patricia office and the [place] tech told her that her pain was not GYN related so the pt decided to come to the MAU without seeing Dr. Richardson Dopp. She denies fever or any other sx.  Chief Complaint  Patient presents with  . Abdominal Pain    RLQ   HPI  OB History    Grav Para Term Preterm Abortions TAB SAB Ect Mult Living                  Past Medical History  Diagnosis Date  . Hypothyroidism   . GERD (gastroesophageal reflux disease)     frequently but not on meds  . Headache     has been a long time  . Anxiety   . Depression 2003    bi-polar  . 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 Bipolar 1 disorder   . Borderline personality disorder     Past Surgical History  Procedure Date  . Cholecystectomy 2011  . Fracture surgery 2008    left foot  . Hysteroscopy w/d&c     Family History  Problem Relation Age of Onset  . Cancer Mother   . Mental illness Mother   . Diabetes Father     History  Substance Use Topics  . Smoking status: Current Everyday Smoker -- 0.5 packs/day for 15 years    Types: Cigarettes  . Smokeless tobacco: Not on file  . Alcohol Use: No    Allergies:  Allergies  Allergen Reactions  . Codeine Anaphylaxis  . Hydrocodone Anaphylaxis  . Oxycodone Anaphylaxis  . Trazodone And Nefazodone Other (See Comments)    Blurry vision and then passed out at work, had to go to ER  . Latex Rash    Prescriptions prior to admission  Medication Sig Dispense Refill  . buPROPion (WELLBUTRIN XL) 300 MG 24 hr tablet Take 300 mg by mouth daily.        . calcium carbonate (TUMS EX) 750 MG chewable tablet Chew 1 tablet by mouth daily.        . Cholecalciferol  50000 UNITS capsule Take 50,000 Units by mouth every Wednesday.        . clobetasol (TEMOVATE) 0.05 % ointment Apply 1 application topically 2 (two) times daily.        . diazepam (VALIUM) 10 MG tablet Take 10 mg by mouth every 6 (six) hours as needed. For anxiety.       . DULoxetine (CYMBALTA) 60 MG capsule Take 60 mg by mouth daily.        . Emollient (CERAVE EX) Apply 1 application topically daily.        Marland Kitchen ibuprofen (IBU) 600 MG tablet Take 1 tablet (600 mg total) by mouth every 6 (six) hours as needed for pain.  30 tablet  1  . ketoconazole (NIZORAL) 2 % cream Apply 1 application topically daily.        Marland Kitchen levothyroxine (SYNTHROID, LEVOTHROID) 175 MCG tablet Take 175 mcg by mouth daily.        . polyethylene glycol powder (MIRALAX) powder Take 17 g by mouth once.        Marland Kitchen  QUEtiapine (SEROQUEL) 100 MG tablet Take 100 mg by mouth at bedtime.          Review of Systems  Constitutional: Negative for fever and chills.  Cardiovascular: Negative for chest pain and palpitations.  Gastrointestinal: Positive for abdominal pain and constipation. Negative for nausea, vomiting and diarrhea.  Genitourinary: Negative for dysuria, urgency, frequency, hematuria and flank pain.  Neurological: Negative for dizziness.  Psychiatric/Behavioral: Negative for depression and suicidal ideas.   Physical Exam   Blood pressure 111/70, pulse 109, temperature 98.7 F (37.1 C), temperature source Oral, resp. rate 20.  Physical Exam  Constitutional: She is oriented to person, place, and time. She appears well-developed and well-nourished. No distress.  HENT:  Head: Normocephalic and atraumatic.  Eyes: EOM are normal. Pupils are equal, round, and reactive to light.  GI: Soft. She exhibits no distension and no mass. There is tenderness. There is no rebound and no guarding.  Neurological: She is alert and oriented to person, place, and time.  Skin: Skin is warm and dry. She is not diaphoretic.  Psychiatric: She has  a normal mood and affect. Her behavior is normal. Judgment and thought content normal.    MAU Course  Procedures  Results for orders placed during the hospital encounter of 10/24/10 (from the past 24 hour(s))  URINALYSIS, ROUTINE W REFLEX MICROSCOPIC     Status: Abnormal   Collection Time   10/24/10  1:25 PM      Component Value Range   Color, Urine YELLOW  YELLOW    Appearance CLEAR  CLEAR    Specific Gravity, Urine 1.020  1.005 - 1.030    pH 6.0  5.0 - 8.0    Glucose, UA NEGATIVE  NEGATIVE (mg/dL)   Hgb urine dipstick TRACE (*) NEGATIVE    Bilirubin Urine NEGATIVE  NEGATIVE    Ketones, ur NEGATIVE  NEGATIVE (mg/dL)   Protein, ur NEGATIVE  NEGATIVE (mg/dL)   Urobilinogen, UA 0.2  0.0 - 1.0 (mg/dL)   Nitrite NEGATIVE  NEGATIVE    Leukocytes, UA NEGATIVE  NEGATIVE   URINE MICROSCOPIC-ADD ON     Status: Abnormal   Collection Time   10/24/10  1:25 PM      Component Value Range   Squamous Epithelial / LPF FEW (*) RARE    WBC, UA 0-2  <3 (WBC/hpf)   RBC / HPF 0-2  <3 (RBC/hpf)   Bacteria, UA FEW (*) RARE    Discussed pt with Dr. Richardson Dopp. Her pelvic US was totally NL today at her office. She advises to dc pt and have her f/u in her office.  Assessment and Plan  Pelvic pain: pt will f/u with Dr. Richardson Dopp. Discussed diet, activity, risks, and precautions.  RICE,EASTON 10/24/2010, 1:48 PM   Henrietta Hoover, PA 10/24/10 1437

## 2010-10-24 NOTE — Progress Notes (Signed)
Had D&C and hysteroscopy last Thursday, has had RLQ pain since, is constant in nature.  Had vag u/s at noon today at Dr. Dawayne Patricia office.  Has appt at 1500 today but states "u/s tech said that she will probably just tell me that it isn't GYN related."

## 2010-10-24 NOTE — Progress Notes (Signed)
Pt states r lower quadrant pain, was seen yesterday for constipation. Had d and c and hysteroscopy last week, and pain began post-operatively. Has had n/v, no diarrhea. Was given enema in MAU yesterday. RLQ pain constant, denies vag d/c changes, spotting post-op.

## 2010-10-29 ENCOUNTER — Other Ambulatory Visit: Payer: Self-pay | Admitting: Family Medicine

## 2010-10-29 DIAGNOSIS — E041 Nontoxic single thyroid nodule: Secondary | ICD-10-CM

## 2010-11-01 ENCOUNTER — Ambulatory Visit
Admission: RE | Admit: 2010-11-01 | Discharge: 2010-11-01 | Disposition: A | Discharge status: Home / Self Care | Payer: Medicare Other | Source: Ambulatory Visit | Attending: Family Medicine | Admitting: Family Medicine

## 2010-11-01 DIAGNOSIS — E041 Nontoxic single thyroid nodule: Secondary | ICD-10-CM

## 2010-11-13 ENCOUNTER — Encounter (HOSPITAL_COMMUNITY): Payer: Self-pay | Admitting: Obstetrics and Gynecology

## 2010-12-18 NOTE — H&P (Signed)
Doristine Section  DICTATION # [id number] CSN# [id number]   [name], MD 12/18/2010 9:46 AM

## 2010-12-19 NOTE — H&P (Signed)
NAME:  ,                                 ACCOUNT NO.:  MEDICAL RECORD NO.:  1  LOCATION:                                 FACILITY:  PHYSICIAN:  Teagon Kron M. Marcelle Overlie, M.D.    DATE OF BIRTH:  DATE OF ADMISSION: DATE OF DISCHARGE:                             HISTORY & PHYSICAL   CHIEF COMPLAINT: 1. Cervical dysplasia, recurrent. 2. Requests permanent sterilization.  HISTORY OF PRESENT ILLNESS:  A 40 year old G0, P0.  This patient transferred her care recently from a different practice where she has had been followed for recurrent abnormal paps.  She states she has had colposcopy and biopsy 3 times in the past and has had amenorrhea secondary to a long history of premature ovarian failure.  Recently she had a spontaneous period.  Because of that she underwent D and C and hysteroscopy with normal pathology by her prior physician.  Her most recent Pap February 12 showed recurrent low-grade dysplasia.  Our initial discussion November 20, 2010, we checked her [facility] at that time and plan to repeat her Pap which was done November 28, 2010.  Pap at that time showed low-grade dysplasia, positive high-risk HPV.  Additionally her [facility] November 20, 2010 returned relatively normal at 24.  This was explained to her.  After a lengthy discussion, she prefers to proceed with LEEP to not only further evaluate but also to treat her recurrent dysplasia and laparoscopic tubal by Filshie clip application.  This procedure including risks related to bleeding, infection, adjacent organ injury, the possible need for open additional surgery reviewed plus the permanence of the procedure, failure rate of 2-3 per thousand all discussed.  Also the need for careful followup cytology reviewed with her.  PAST MEDICAL HISTORY:  ALLERGIES:  CODEINE, HYDROCODONE, VICODIN.  CURRENT MEDICATIONS:  Cymbalta, Wellbutrin, Seroquel, Synthroid, Valium, vitamin D and other supplements.  PAST SURGERIES:  She has had foot surgery  twice, cholecystectomy December 2011, D and C and hysteroscopy earlier this year 2012.  REVIEW OF SYSTEMS:  Significant for history of abnormal Pap, headache, thyroid disease, gallstones.  FAMILY HISTORY:  Significant for heart disease, asthma, hepatitis, anemia, unspecified cancer, diabetes, arthritis, UTI, kidney disease.  SOCIAL HISTORY:  Denies drug use.  She does smoke 1/2 PPD.  Denies alcohol use.  She is divorced.  Listed her family doctor as Dr. Laurann Montana.  PSYCHIATRIC HISTORY:  She has a borderline personality disorder with bipolar disease and depression which is why she is on her Cymbalta, Wellbutrin, and Seroquel.  PHYSICAL EXAMINATION:  VITAL SIGNS:  Temp 98.2, blood pressure 118/82. HEENT:  Unremarkable. NECK:  Supple without masses. LUNGS:  Clear. CARDIOVASCULAR:  Regular rate and rhythm without murmurs, rubs, or gallops. LUNGS:  Clear. BREASTS:  Without masses. ABDOMEN:  Soft, flat, nontender. PELVIC:  Normal external genitalia.  Vagina and cervix clear.  Uterus mid positional size.  Adnexa negative. EXTREMITIES AND NEUROLOGIC:  Unremarkable.  IMPRESSION: 1. Recurrent dysplasia, most recent Pap, mild dysplasia with positive     high-risk HPV. 2. Requests permanent sterilization.  PLAN:  Filshie clip tubal, loop electrode  excision procedure (LEEP). Procedure and risks reviewed as above.     Kole Hilyard M. Marcelle Overlie, M.D.     [facility]MEDQ  D:  12/18/2010  T:  12/18/2010  Job:  [id number]

## 2010-12-25 LAB — DIFFERENTIAL
Eosinophils Absolute: 0.4
Lymphocytes Relative: 9 — ABNORMAL LOW
Lymphs Abs: 0.7
Monocytes Relative: 6
Neutrophils Relative %: 80 — ABNORMAL HIGH

## 2010-12-25 LAB — LIPASE, BLOOD: Lipase: 29

## 2010-12-25 LAB — CBC
Hemoglobin: 13.6
MCHC: 34
MCV: 89.4
RDW: 13.5

## 2010-12-25 LAB — COMPREHENSIVE METABOLIC PANEL
ALT: 34
Calcium: 9.7
Creatinine, Ser: 0.94
GFR calc Af Amer: >60
Glucose, Bld: 92
Sodium: 139
Total Protein: 6.6

## 2010-12-25 LAB — URINALYSIS, ROUTINE W REFLEX MICROSCOPIC
Nitrite: NEGATIVE
Specific Gravity, Urine: 1.017
Urobilinogen, UA: 0.2

## 2010-12-25 LAB — URINE MICROSCOPIC-ADD ON

## 2010-12-31 NOTE — Patient Instructions (Addendum)
   Your procedure is scheduled on:  Thursday, Oct. 11, 2012  Enter through the [employer] [facility] at: 6:00am Pick up the phone at the desk and dial [phone] and inform us of your arrival  Please call this number if you have any problems the morning of surgery: [phone]  Remember: Do not eat food after midnight  Wednesday, Oct 10th Do not drink clear liquids after: Wednesday, Oct 10th Take these medicines the morning of surgery with a SIP OF WATER: per patient - she will not take any meds until she gets home after surgery  Do not wear jewelry, make-up, or FINGER nail polish Do not wear lotions, powders, or perfumes.  You may wear deodorant. Do not shave 48 hours prior to surgery. Do not bring valuables to the hospital.  Patients discharged on the day of surgery will not be allowed to drive home.   Name and phone number of your driver:  boyfriend Feleica Fulmore  cell  [phone]   Remember to use your hibiclens as instructed.Please shower with 1/2 bottle the evening before your surgery and the other 1/2 bottle the morning of surgery.

## 2011-01-02 ENCOUNTER — Encounter (HOSPITAL_COMMUNITY): Payer: Self-pay

## 2011-01-02 ENCOUNTER — Encounter (HOSPITAL_COMMUNITY)
Admission: RE | Admit: 2011-01-02 | Discharge: 2011-01-02 | Disposition: A | Discharge status: Home / Self Care | Payer: Medicare Other | Source: Ambulatory Visit | Attending: Obstetrics and Gynecology | Admitting: Obstetrics and Gynecology

## 2011-01-02 LAB — CBC
HCT: 43.6 % (ref 36.0–46.0)
MCHC: 32.8 g/dL (ref 30.0–36.0)
MCV: 93 fL (ref 78.0–100.0)
Platelets: 326 10*3/uL (ref 150–400)
RDW: 13.4 % (ref 11.5–15.5)

## 2011-01-02 LAB — SURGICAL PCR SCREEN: Staphylococcus aureus: NEGATIVE

## 2011-01-02 MED ORDER — SCOPOLAMINE 1 MG/3DAYS TD PT72: 1.0000 | MEDICATED_PATCH | Freq: Once | TRANSDERMAL | Status: DC

## 2011-01-02 MED ORDER — GLYCOPYRROLATE 0.2 MG/ML IJ SOLN: 0.2000 mg | Freq: Once | INTRAMUSCULAR | Status: DC | PRN

## 2011-01-02 MED ORDER — MIDAZOLAM HCL 2 MG/2ML IJ SOLN: 0.5000 mg | INTRAMUSCULAR | Status: DC | PRN

## 2011-01-02 MED ORDER — ATROPINE SULFATE 0.4 MG/ML IJ SOLN: 0.4000 mg | Freq: Once | INTRAMUSCULAR | Status: DC | PRN

## 2011-01-02 MED ORDER — MIDAZOLAM HCL 2 MG/2ML IJ SOLN: 1.0000 mg | INTRAMUSCULAR | Status: DC | PRN

## 2011-01-02 MED ORDER — FAMOTIDINE 20 MG PO TABS: 20.0000 mg | ORAL_TABLET | Freq: Once | ORAL | Status: DC

## 2011-01-02 MED ORDER — DIPHENHYDRAMINE HCL 50 MG/ML IJ SOLN: 6.2500 mg | Freq: Once | INTRAMUSCULAR | Status: DC

## 2011-01-02 MED ORDER — MEPERIDINE HCL 25 MG/ML IJ SOLN: 6.2500 mg | INTRAMUSCULAR | Status: DC | PRN

## 2011-01-02 MED ORDER — CITRIC ACID-SODIUM CITRATE 334-500 MG/5ML PO SOLN: 30.0000 mL | Freq: Once | ORAL | Status: DC

## 2011-01-02 MED ORDER — HYDROMORPHONE HCL 1 MG/ML IJ SOLN: 0.2500 mg | INTRAMUSCULAR | Status: DC | PRN

## 2011-01-02 MED ORDER — ONDANSETRON HCL 4 MG/2ML IJ SOLN: 4.0000 mg | Freq: Once | INTRAMUSCULAR | Status: DC

## 2011-01-02 MED ORDER — FENTANYL CITRATE 0.05 MG/ML IJ SOLN: 50.0000 ug | INTRAMUSCULAR | Status: DC | PRN

## 2011-01-02 MED ORDER — PANTOPRAZOLE SODIUM 40 MG PO TBEC: 40.0000 mg | DELAYED_RELEASE_TABLET | Freq: Once | ORAL | Status: DC

## 2011-01-02 MED ORDER — OXYMETAZOLINE HCL 0.05 % NA SOLN: 2.0000 | Freq: Once | NASAL | Status: DC

## 2011-01-02 NOTE — Pre-Procedure Instructions (Signed)
Ok to see patient DOS. 

## 2011-01-09 ENCOUNTER — Encounter (HOSPITAL_COMMUNITY)
Admission: RE | Disposition: A | Discharge status: Home / Self Care | Payer: Self-pay | Source: Ambulatory Visit | Attending: Obstetrics and Gynecology

## 2011-01-09 ENCOUNTER — Ambulatory Visit (HOSPITAL_COMMUNITY)
Admission: RE | Admit: 2011-01-09 | Discharge: 2011-01-09 | Disposition: A | Discharge status: Home / Self Care | Payer: Medicare Other | Source: Ambulatory Visit | Attending: Obstetrics and Gynecology | Admitting: Obstetrics and Gynecology

## 2011-01-09 ENCOUNTER — Encounter (HOSPITAL_COMMUNITY): Payer: Self-pay | Admitting: Anesthesiology

## 2011-01-09 ENCOUNTER — Ambulatory Visit (HOSPITAL_COMMUNITY): Payer: Medicare Other | Admitting: Anesthesiology

## 2011-01-09 ENCOUNTER — Other Ambulatory Visit: Payer: Self-pay | Admitting: Obstetrics and Gynecology

## 2011-01-09 DIAGNOSIS — Z302 Encounter for sterilization: Secondary | ICD-10-CM | POA: Insufficient documentation

## 2011-01-09 DIAGNOSIS — N870 Mild cervical dysplasia: Secondary | ICD-10-CM | POA: Insufficient documentation

## 2011-01-09 DIAGNOSIS — Z01818 Encounter for other preprocedural examination: Secondary | ICD-10-CM | POA: Insufficient documentation

## 2011-01-09 DIAGNOSIS — Z01812 Encounter for preprocedural laboratory examination: Secondary | ICD-10-CM | POA: Insufficient documentation

## 2011-01-09 HISTORY — PX: LEEP: SHX91

## 2011-01-09 HISTORY — PX: LAPAROSCOPIC TUBAL LIGATION: SHX1937

## 2011-01-09 SURGERY — LEEP (LOOP ELECTROSURGICAL EXCISION PROCEDURE)
Anesthesia: General

## 2011-01-09 MED ORDER — LIDOCAINE HCL (CARDIAC) 20 MG/ML IV SOLN
INTRAVENOUS | Status: DC | PRN
  Administered 2011-01-09: 100 mg via INTRAVENOUS

## 2011-01-09 MED ORDER — ROCURONIUM BROMIDE 100 MG/10ML IV SOLN
INTRAVENOUS | Status: DC | PRN
  Administered 2011-01-09: 5 mg via INTRAVENOUS
  Administered 2011-01-09: 35 mg via INTRAVENOUS

## 2011-01-09 MED ORDER — FENTANYL CITRATE 0.05 MG/ML IJ SOLN
INTRAMUSCULAR | Status: AC
  Filled 2011-01-09: qty 5

## 2011-01-09 MED ORDER — BUPIVACAINE HCL (PF) 0.25 % IJ SOLN
INTRAMUSCULAR | Status: DC | PRN
  Administered 2011-01-09: 10 mL

## 2011-01-09 MED ORDER — NEOSTIGMINE METHYLSULFATE 1 MG/ML IJ SOLN
INTRAMUSCULAR | Status: DC | PRN
  Administered 2011-01-09: 2 mg via INTRAVENOUS

## 2011-01-09 MED ORDER — ONDANSETRON HCL 4 MG/2ML IJ SOLN
INTRAMUSCULAR | Status: DC | PRN
  Administered 2011-01-09: 4 mg via INTRAVENOUS

## 2011-01-09 MED ORDER — KETOROLAC TROMETHAMINE 60 MG/2ML IM SOLN
INTRAMUSCULAR | Status: AC
  Filled 2011-01-09: qty 2

## 2011-01-09 MED ORDER — FERRIC SUBSULFATE SOLN
Status: DC | PRN
  Administered 2011-01-09: 1

## 2011-01-09 MED ORDER — GLYCOPYRROLATE 0.2 MG/ML IJ SOLN
INTRAMUSCULAR | Status: AC
  Filled 2011-01-09: qty 1

## 2011-01-09 MED ORDER — LIDOCAINE-EPINEPHRINE 1 %-1:100000 IJ SOLN
INTRAMUSCULAR | Status: DC | PRN
  Administered 2011-01-09: 4 mL

## 2011-01-09 MED ORDER — FENTANYL CITRATE 0.05 MG/ML IJ SOLN
25.0000 ug | INTRAMUSCULAR | Status: DC | PRN
  Administered 2011-01-09 (×3): 25 ug via INTRAVENOUS

## 2011-01-09 MED ORDER — ROCURONIUM BROMIDE 50 MG/5ML IV SOLN
INTRAVENOUS | Status: AC
  Filled 2011-01-09: qty 1

## 2011-01-09 MED ORDER — PROPOFOL 10 MG/ML IV EMUL
INTRAVENOUS | Status: AC
  Filled 2011-01-09: qty 20

## 2011-01-09 MED ORDER — GLYCOPYRROLATE 0.2 MG/ML IJ SOLN
INTRAMUSCULAR | Status: DC | PRN
  Administered 2011-01-09: .2 mg via INTRAVENOUS
  Administered 2011-01-09: .4 mg via INTRAVENOUS

## 2011-01-09 MED ORDER — ACETAMINOPHEN 10 MG/ML IV SOLN
1000.0000 mg | Freq: Once | INTRAVENOUS | Status: AC | PRN
  Administered 2011-01-09: 1000 mg via INTRAVENOUS
  Filled 2011-01-09: qty 100

## 2011-01-09 MED ORDER — FENTANYL CITRATE 0.05 MG/ML IJ SOLN
INTRAMUSCULAR | Status: AC
  Administered 2011-01-09: 25 ug via INTRAVENOUS
  Filled 2011-01-09: qty 2

## 2011-01-09 MED ORDER — DEXAMETHASONE SODIUM PHOSPHATE 10 MG/ML IJ SOLN
INTRAMUSCULAR | Status: AC
  Filled 2011-01-09: qty 1

## 2011-01-09 MED ORDER — LACTATED RINGERS IV SOLN
INTRAVENOUS | Status: DC
  Administered 2011-01-09 (×2): via INTRAVENOUS

## 2011-01-09 MED ORDER — ONDANSETRON HCL 4 MG/2ML IJ SOLN
INTRAMUSCULAR | Status: AC
  Filled 2011-01-09: qty 2

## 2011-01-09 MED ORDER — KETOROLAC TROMETHAMINE 30 MG/ML IJ SOLN
INTRAMUSCULAR | Status: DC | PRN
  Administered 2011-01-09: 60 mg via INTRAVENOUS

## 2011-01-09 MED ORDER — LIDOCAINE HCL (CARDIAC) 20 MG/ML IV SOLN
INTRAVENOUS | Status: AC
  Filled 2011-01-09: qty 5

## 2011-01-09 MED ORDER — PROPOFOL 10 MG/ML IV EMUL
INTRAVENOUS | Status: DC | PRN
  Administered 2011-01-09: 200 mg via INTRAVENOUS

## 2011-01-09 MED ORDER — CEFAZOLIN SODIUM 1-5 GM-% IV SOLN
INTRAVENOUS | Status: DC | PRN
  Administered 2011-01-09: 1 g via INTRAVENOUS

## 2011-01-09 MED ORDER — FENTANYL CITRATE 0.05 MG/ML IJ SOLN
INTRAMUSCULAR | Status: DC | PRN
  Administered 2011-01-09 (×5): 50 ug via INTRAVENOUS
  Administered 2011-01-09: 100 ug via INTRAVENOUS

## 2011-01-09 MED ORDER — NEOSTIGMINE METHYLSULFATE 1 MG/ML IJ SOLN
INTRAMUSCULAR | Status: AC
  Filled 2011-01-09: qty 10

## 2011-01-09 MED ORDER — CEFAZOLIN SODIUM 1-5 GM-% IV SOLN
INTRAVENOUS | Status: AC
  Filled 2011-01-09: qty 50

## 2011-01-09 MED ORDER — FENTANYL CITRATE 0.05 MG/ML IJ SOLN
INTRAMUSCULAR | Status: AC
  Filled 2011-01-09: qty 2

## 2011-01-09 MED ORDER — MIDAZOLAM HCL 2 MG/2ML IJ SOLN
INTRAMUSCULAR | Status: AC
  Filled 2011-01-09: qty 2

## 2011-01-09 MED ORDER — CEFAZOLIN SODIUM 1-5 GM-% IV SOLN: 1.0000 g | INTRAVENOUS | Status: DC

## 2011-01-09 MED ORDER — DEXAMETHASONE SODIUM PHOSPHATE 4 MG/ML IJ SOLN
INTRAMUSCULAR | Status: DC | PRN
  Administered 2011-01-09: 10 mg via INTRAVENOUS

## 2011-01-09 MED ORDER — MIDAZOLAM HCL 5 MG/5ML IJ SOLN
INTRAMUSCULAR | Status: DC | PRN
  Administered 2011-01-09: 2 mg via INTRAVENOUS

## 2011-01-09 MED ORDER — TRAMADOL HCL 50 MG PO TABS
50.0000 mg | ORAL_TABLET | Freq: Four times a day (QID) | ORAL | Status: AC | PRN
Start: 2011-01-09 — End: 2011-01-19

## 2011-01-09 SURGICAL SUPPLY — 39 items
APPLICATOR COTTON TIP 6IN STRL (MISCELLANEOUS) ×3 IMPLANT
CATH ROBINSON RED A/P 16FR (CATHETERS) ×3 IMPLANT
CLIP FILSHIE TUBAL LIGA STRL (Clip) ×3 IMPLANT
CLOTH BEACON ORANGE TIMEOUT ST (SAFETY) ×3 IMPLANT
CONT SPECI 4OZ STER CLIK (MISCELLANEOUS) ×3 IMPLANT
DERMABOND ADVANCED (GAUZE/BANDAGES/DRESSINGS) ×1
DERMABOND ADVANCED .7 DNX12 (GAUZE/BANDAGES/DRESSINGS) ×2 IMPLANT
DRESSING TELFA 8X3 (GAUZE/BANDAGES/DRESSINGS) ×3 IMPLANT
DRSG COVADERM PLUS 2X2 (GAUZE/BANDAGES/DRESSINGS) ×3 IMPLANT
ELECT BALL LEEP 5MM RED (ELECTRODE) ×3 IMPLANT
ELECT LOOP 20X12 DISP (CUTTING LOOP)
ELECT LOOP LEEP RND 15X12 GRN (CUTTING LOOP) ×3
ELECT REM PT RETURN 9FT ADLT (ELECTROSURGICAL) ×3
ELECTRODE LOOP 20X12 DISP (CUTTING LOOP) IMPLANT
ELECTRODE LOOP LP RND 15X12GRN (CUTTING LOOP) ×2 IMPLANT
ELECTRODE REM PT RTRN 9FT ADLT (ELECTROSURGICAL) ×2 IMPLANT
EVACUATOR PREFILTER SMOKE (MISCELLANEOUS) ×3 IMPLANT
GAUZE SPONGE 4X4 16PLY XRAY LF (GAUZE/BANDAGES/DRESSINGS) IMPLANT
GLOVE BIO SURGEON STRL SZ7 (GLOVE) ×6 IMPLANT
GOWN PREVENTION PLUS LG XLONG (DISPOSABLE) ×6 IMPLANT
HOSE NS SMOKE EVAC 7/8 X6 (MISCELLANEOUS) ×3 IMPLANT
NEEDLE INSUFFLATION 14GA 120MM (NEEDLE) ×3 IMPLANT
NEEDLE SPNL 22GX3.5 QUINCKE BK (NEEDLE) ×3 IMPLANT
NS IRRIG 1000ML POUR BTL (IV SOLUTION) ×3 IMPLANT
PACK LAPAROSCOPY BASIN (CUSTOM PROCEDURE TRAY) ×3 IMPLANT
PENCIL BUTTON HOLSTER BLD 10FT (ELECTRODE) ×3 IMPLANT
REDUCER FITTING SMOKE EVAC (MISCELLANEOUS) ×3 IMPLANT
SCOPETTES 8  STERILE (MISCELLANEOUS) ×2
SCOPETTES 8 STERILE (MISCELLANEOUS) ×4 IMPLANT
STRIP CLOSURE SKIN 1/4X3 (GAUZE/BANDAGES/DRESSINGS) ×3 IMPLANT
SUT VIC AB 2-0 UR6 27 (SUTURE) IMPLANT
SUT VICRYL RAPIDE 4/0 PS 2 (SUTURE) ×3 IMPLANT
SYR 20CC LL (SYRINGE) ×3 IMPLANT
SYR CONTROL 10ML LL (SYRINGE) ×3 IMPLANT
TOWEL OR 17X24 6PK STRL BLUE (TOWEL DISPOSABLE) ×6 IMPLANT
TROCAR Z-THREAD BLADED 11X100M (TROCAR) ×3 IMPLANT
TUBING SMOKE EVAC HOSE ADAPTER (MISCELLANEOUS) ×3 IMPLANT
WARMER LAPAROSCOPE (MISCELLANEOUS) ×3 IMPLANT
WATER STERILE IRR 1000ML POUR (IV SOLUTION) ×3 IMPLANT

## 2011-01-09 NOTE — Op Note (Signed)
Preoperative diagnosis: 1 recurrent cervical dysplasia    2. Request permanent sterilization  Postoperative diagnosis: Same  Procedure: 1 LEEP  2 tubal ligation by Filshie clip application  Surgeon: Marcelle Overlie  Specimens removed: LEEP specimen to pathology  Complications: None  EBL: Minimal  Procedure and findings:  Patient was taken to the operating room after an adequate level of general anesthesia was obtained with the patient's legs in stirrups the abdomen perineum and vagina were prepped and draped in the usual manner for laparoscopy. The bladder was drained he UA carried out uterus midposition normal size adnexa negative. Holter tenaculum was positioned, attention directed to the subumbilical area which was infiltrated with quarter percent Marcaine plain small incision was made in the varies needle was introduced without difficulty. Its intra-abdominal position was verified by pressure and water testing. After a 3 L pneumoperitoneum syncopated lap scopic trocar and sleeve were then introduced that difficulty. There was no evidence of any bleeding or trauma. The pelvic findings were unremarkable. 45 cc of quarter percent Marcaine with and draped across the tube from the cornu to the fimbriated end, Filshie clip applicator was backloaded the right tube was traced completely for positive identification and a Filshie clip applied a right ankle 2 cm from the cornu with excellent application on the right the same was repeated on the left after carefully identifying the tube. Pictures were taken. This was removed gas allowed to escape defect closed with a 4-0 Vicryl subcuticular suture.   The legs were extended in preparation for the LEEP procedure. Appropriate loop electrode was selected. Intracervical 2% Xylocaine with epi one past LEEP was performed with excellent hemostasis ball tip electrode used for coagulation of the base along with Monsel's she tolerated this well went to recovery room in good  condition  Dictated with [employer], not proofread Duke Salvia. Milana Obey.D.

## 2011-01-09 NOTE — Anesthesia Preprocedure Evaluation (Signed)
Anesthesia Evaluation  Name, MR# and DOB Patient awake  General Assessment Comment  Reviewed: Allergy & Precautions, H&P , NPO status , Patient's Chart, lab work & pertinent test results, reviewed documented beta blocker date and time   Airway Mallampati: I TM Distance: >3 FB Neck ROM: full    Dental No notable dental hx. (+) Teeth Intact   Pulmonary Current Smoker    Pulmonary exam normal       Cardiovascular     Neuro/Psych PSYCHIATRIC DISORDERS Anxiety Depression    GI/Hepatic Neg liver ROS  GERD   Endo/Other  Hypothyroidism Morbid obesity  Renal/GU negative Renal ROS  Genitourinary negative   Musculoskeletal negative musculoskeletal ROS (+)   Abdominal (+) obese,   Peds  Hematology negative hematology ROS (+)   Anesthesia Other Findings   Reproductive/Obstetrics negative OB ROS                           Anesthesia Physical Anesthesia Plan  ASA: III  Anesthesia Plan: General   Post-op Pain Management:    Induction: Intravenous  Airway Management Planned: Oral ETT  Additional Equipment:   Intra-op Plan:   Post-operative Plan: Extubation in OR  Informed Consent: I have reviewed the patients History and Physical, chart, labs and discussed the procedure including the risks, benefits and alternatives for the proposed anesthesia with the patient or authorized representative who has indicated his/her understanding and acceptance.   Dental Advisory Given  Plan Discussed with: CRNA  Anesthesia Plan Comments:         Anesthesia Quick Evaluation

## 2011-01-09 NOTE — Preoperative (Signed)
Beta Blockers   Reason not to administer Beta Blockers:Not Applicable 

## 2011-01-09 NOTE — Progress Notes (Signed)
Pt continues to c/o pain 5/10.  Has received 75 mcg of Fentanyl IV while in PACU.  Patient with slightly garbled speech and lethargic.  When awakened she moans and groans in pain.  Dr. Rodman Pickle notified and pain issues discussed.  Do not want to give additional narcotics, so Tylenol IV ordered per Dr. Rodman Pickle.  Pharmacy called.

## 2011-01-09 NOTE — Progress Notes (Signed)
Seen in holding area, no change in status/rmh

## 2011-01-09 NOTE — Transfer of Care (Signed)
Immediate Anesthesia Transfer of Care Note  Patient: Lindsey Nicholson  Procedure(s) Performed:  LOOP ELECTROSURGICAL EXCISION PROCEDURE (LEEP); LAPAROSCOPIC TUBAL LIGATION - with Filshie clips  Patient Location: PACU  Anesthesia Type: General  Level of Consciousness: awake, alert , oriented and patient cooperative  Airway & Oxygen Therapy: Patient Spontanous Breathing and Patient connected to nasal cannula oxygen  Post-op Assessment: Report given to PACU RN and Post -op Vital signs reviewed and stable  Post vital signs: Reviewed and stable  Complications: No apparent anesthesia complications

## 2011-01-09 NOTE — Anesthesia Postprocedure Evaluation (Signed)
Anesthesia Post Note  Patient: Lindsey Nicholson Section  Procedure(s) Performed:  LOOP ELECTROSURGICAL EXCISION PROCEDURE (LEEP); LAPAROSCOPIC TUBAL LIGATION - with Filshie clips  Anesthesia type: General  Patient location: PACU  Post pain: Pain level controlled  Post assessment: Post-op Vital signs reviewed  Last Vitals:  Filed Vitals:   01/09/11 1000  BP: 110/69  Pulse: 64  Temp:   Resp: 20    Post vital signs: Reviewed  Level of consciousness: sedated  Complications: No apparent anesthesia complications

## 2011-01-13 ENCOUNTER — Encounter (HOSPITAL_COMMUNITY): Payer: Self-pay | Admitting: Obstetrics and Gynecology

## 2011-03-22 ENCOUNTER — Emergency Department: Payer: Self-pay | Admitting: Emergency Medicine

## 2011-04-18 ENCOUNTER — Ambulatory Visit (HOSPITAL_COMMUNITY): Payer: Medicare Other | Admitting: Psychiatry

## 2011-06-25 ENCOUNTER — Encounter (HOSPITAL_BASED_OUTPATIENT_CLINIC_OR_DEPARTMENT_OTHER): Payer: Self-pay

## 2011-06-25 ENCOUNTER — Emergency Department (INDEPENDENT_AMBULATORY_CARE_PROVIDER_SITE_OTHER): Payer: Medicare Other

## 2011-06-25 ENCOUNTER — Emergency Department (HOSPITAL_BASED_OUTPATIENT_CLINIC_OR_DEPARTMENT_OTHER)
Admission: EM | Admit: 2011-06-25 | Discharge: 2011-06-25 | Disposition: A | Discharge status: Home / Self Care | Payer: Medicare Other | Attending: Emergency Medicine | Admitting: Emergency Medicine

## 2011-06-25 DIAGNOSIS — K219 Gastro-esophageal reflux disease without esophagitis: Secondary | ICD-10-CM | POA: Insufficient documentation

## 2011-06-25 DIAGNOSIS — S9030XA Contusion of unspecified foot, initial encounter: Secondary | ICD-10-CM

## 2011-06-25 DIAGNOSIS — F319 Bipolar disorder, unspecified: Secondary | ICD-10-CM | POA: Insufficient documentation

## 2011-06-25 DIAGNOSIS — S99929A Unspecified injury of unspecified foot, initial encounter: Secondary | ICD-10-CM

## 2011-06-25 DIAGNOSIS — X58XXXA Exposure to other specified factors, initial encounter: Secondary | ICD-10-CM

## 2011-06-25 DIAGNOSIS — M79609 Pain in unspecified limb: Secondary | ICD-10-CM

## 2011-06-25 DIAGNOSIS — F341 Dysthymic disorder: Secondary | ICD-10-CM | POA: Insufficient documentation

## 2011-06-25 DIAGNOSIS — E039 Hypothyroidism, unspecified: Secondary | ICD-10-CM | POA: Insufficient documentation

## 2011-06-25 DIAGNOSIS — W208XXA Other cause of strike by thrown, projected or falling object, initial encounter: Secondary | ICD-10-CM | POA: Insufficient documentation

## 2011-06-25 MED ORDER — OXYCODONE-ACETAMINOPHEN 5-325 MG PO TABS: ORAL_TABLET | ORAL | Status: DC

## 2011-06-25 MED ORDER — IBUPROFEN 800 MG PO TABS
800.0000 mg | ORAL_TABLET | Freq: Once | ORAL | Status: AC
  Administered 2011-06-25: 800 mg via ORAL
  Filled 2011-06-25: qty 1

## 2011-06-25 NOTE — ED Notes (Signed)
Dr. Ghim at bedside. 

## 2011-06-25 NOTE — Discharge Instructions (Signed)

## 2011-06-25 NOTE — ED Notes (Signed)
Ice pack applied to toes.

## 2011-06-25 NOTE — ED Notes (Signed)
Dropped sewing machine on right foot approx 1pm today

## 2011-06-25 NOTE — ED Notes (Signed)
Toe ring to injured toe removed with ring cutter. Ring given back to patient.

## 2011-06-26 NOTE — ED Provider Notes (Signed)
History     CSN: [id number]  Arrival date & time 06/25/11  2118   First MD Initiated Contact with Patient 06/25/11 2302      Chief Complaint  Patient presents with  . Foot Injury    (Consider location/radiation/quality/duration/timing/severity/associated sxs/prior treatment) HPI Comments: Patient apparently had dropped a sewing machine on her right foot earlier this afternoon. Patient reports some swelling to her toes and difficulty bearing weight and walking due to the pain. Patient did take some Tylenol with no significant improvement. She denies any other injuries. She denies lower leg pain, wounds, abrasions and denies any knee pain or pain to her upper leg. She also took a Valium prior to arrival which he normally takes for anxiety. She reports that she is allergic to oxycodone as well as hydrocodone and codeine but she can take Percocet.  Patient is a 41 y.o. female presenting with foot injury. The history is provided by the patient and a relative.  Foot Injury  Pertinent negatives include no numbness.    Past Medical History  Diagnosis Date  . Hypothyroidism   . GERD (gastroesophageal reflux disease)     frequently but not on meds  . Headache     has been a long time  . Anxiety   . Depression 2003    bi-polar  . Bipolar 1 disorder     per pt. - she is Bioplar Type II.  Marland Kitchen Borderline personality disorder   . NAFL (nonalcoholic fatty liver)   . Premature ovarian failure   . Low grade squamous intraepithelial dysplasia     Past Surgical History  Procedure Date  . Cholecystectomy 2011  . Fracture surgery 2008    left foot  . Hysteroscopy w/d&c   . Hysteroscopy w/d&c 10/17/2010    Procedure: DILATATION AND CURETTAGE (D&C) /HYSTEROSCOPY;  Surgeon: Jessee Avers;  Location: [facility];  Service: Gynecology;  Laterality: N/A;  . Dilation and curettage of uterus   . Colposcopy   . Foot surgery     x 2  . Gallstones   . Leep 01/09/2011    Procedure: LOOP ELECTROSURGICAL  EXCISION PROCEDURE (LEEP);  Surgeon: [name];  Location: [facility];  Service: Gynecology;  Laterality: N/A;  . Laparoscopic tubal ligation 01/09/2011    Procedure: LAPAROSCOPIC TUBAL LIGATION;  Surgeon: [name];  Location: [facility];  Service: Gynecology;  Laterality: Bilateral;  with Filshie clips    Family History  Problem Relation Age of Onset  . Cancer Mother   . Mental illness Mother   . Diabetes Father     History  Substance Use Topics  . Smoking status: Current Everyday Smoker -- 0.5 packs/day for 25 years    Types: Cigarettes  . Smokeless tobacco: Never Used  . Alcohol Use: No    OB History    Grav Para Term Preterm Abortions TAB SAB Ect Mult Living   0               Review of Systems  Musculoskeletal: Positive for arthralgias.  Skin: Negative for color change and wound.  Neurological: Negative for weakness and numbness.    Allergies  Codeine; Hydrocodone; Oxycodone; Trazodone and nefazodone; and Latex  Home Medications   Current Outpatient Rx  Name Route Sig Dispense Refill  . ABILIFY PO Oral Take by mouth.    . WELLBUTRIN PO Oral Take by mouth.    Marland Kitchen VALIUM PO Oral Take by mouth.    . CYMBALTA PO Oral Take  by mouth.    . OXYCODONE-ACETAMINOPHEN 5-325 MG PO TABS  1-2 tablets po q 6 hours prn moderate to severe pain 12 tablet 0    BP 109/76  Pulse 83  Temp(Src) 97.6 F (36.4 C) (Oral)  Resp 16  Ht 5\' 4"  (1.626 m)  Wt 270 lb (122.471 kg)  BMI 46.35 kg/m2  SpO2 96%  Physical Exam  Vitals reviewed. Constitutional: She appears well-developed and well-nourished.  Musculoskeletal:       Right ankle: She exhibits normal pulse. No lateral malleolus, no medial malleolus, no head of 5th metatarsal and no proximal fibula tenderness found. Achilles tendon normal.       Feet:  Skin: Skin is warm and dry. No rash noted.    ED Course  Procedures (including critical care time)  Labs Reviewed - No data to display Dg Foot Complete  Right  06/25/2011  *RADIOLOGY REPORT*  Clinical Data: Right foot injury and pain.  RIGHT FOOT COMPLETE - 3+ VIEW  Comparison: None  Findings: There is no evidence of acute fracture, subluxation, or dislocation. The Lisfranc joints are intact. No focal bony lesions are identified. There is no evidence of radiopaque foreign body.  The joint spaces are unremarkable.  IMPRESSION: No evidence of acute bony abnormality.  Original Report Authenticated By: Rosendo Gros, M.D.     1. Foot contusion       MDM   I reviewed patient's plain films myself. I recommended that the ring be removed do to some mild swelling that may progress. Ice pack was provided. Patient encouraged to take ibuprofen or Aleve at home for swelling. She is given a short supply of Percocet for additional pain relief as needed.        Gavin Pound. Oletta Lamas, MD 06/26/11 [phone]

## 2011-07-06 ENCOUNTER — Encounter (HOSPITAL_BASED_OUTPATIENT_CLINIC_OR_DEPARTMENT_OTHER): Payer: Self-pay | Admitting: *Deleted

## 2011-07-06 ENCOUNTER — Emergency Department (INDEPENDENT_AMBULATORY_CARE_PROVIDER_SITE_OTHER): Payer: Medicare Other

## 2011-07-06 ENCOUNTER — Emergency Department (HOSPITAL_BASED_OUTPATIENT_CLINIC_OR_DEPARTMENT_OTHER)
Admission: EM | Admit: 2011-07-06 | Discharge: 2011-07-06 | Disposition: A | Discharge status: Home / Self Care | Payer: Medicare Other | Attending: Emergency Medicine | Admitting: Emergency Medicine

## 2011-07-06 DIAGNOSIS — W268XXA Contact with other sharp object(s), not elsewhere classified, initial encounter: Secondary | ICD-10-CM

## 2011-07-06 DIAGNOSIS — F411 Generalized anxiety disorder: Secondary | ICD-10-CM | POA: Insufficient documentation

## 2011-07-06 DIAGNOSIS — F3289 Other specified depressive episodes: Secondary | ICD-10-CM | POA: Insufficient documentation

## 2011-07-06 DIAGNOSIS — F17200 Nicotine dependence, unspecified, uncomplicated: Secondary | ICD-10-CM | POA: Insufficient documentation

## 2011-07-06 DIAGNOSIS — M79609 Pain in unspecified limb: Secondary | ICD-10-CM | POA: Insufficient documentation

## 2011-07-06 DIAGNOSIS — S90859A Superficial foreign body, unspecified foot, initial encounter: Secondary | ICD-10-CM

## 2011-07-06 DIAGNOSIS — W269XXA Contact with unspecified sharp object(s), initial encounter: Secondary | ICD-10-CM | POA: Insufficient documentation

## 2011-07-06 DIAGNOSIS — F329 Major depressive disorder, single episode, unspecified: Secondary | ICD-10-CM | POA: Insufficient documentation

## 2011-07-06 DIAGNOSIS — E039 Hypothyroidism, unspecified: Secondary | ICD-10-CM | POA: Insufficient documentation

## 2011-07-06 DIAGNOSIS — Z1881 Retained glass fragments: Secondary | ICD-10-CM

## 2011-07-06 DIAGNOSIS — S91309A Unspecified open wound, unspecified foot, initial encounter: Secondary | ICD-10-CM

## 2011-07-06 DIAGNOSIS — Y92009 Unspecified place in unspecified non-institutional (private) residence as the place of occurrence of the external cause: Secondary | ICD-10-CM | POA: Insufficient documentation

## 2011-07-06 DIAGNOSIS — IMO0002 Reserved for concepts with insufficient information to code with codable children: Secondary | ICD-10-CM | POA: Insufficient documentation

## 2011-07-06 MED ORDER — LIDOCAINE-EPINEPHRINE-TETRACAINE (LET) SOLUTION
3.0000 mL | Freq: Once | NASAL | Status: AC
  Administered 2011-07-06: 3 mL via TOPICAL
  Filled 2011-07-06: qty 3

## 2011-07-06 MED ORDER — LIDOCAINE-EPINEPHRINE-TETRACAINE (LET) SOLUTION: 3.0000 mL | Freq: Once | NASAL | Status: DC

## 2011-07-06 NOTE — ED Provider Notes (Signed)
History     CSN: [id number]  Arrival date & time 07/06/11  1516   First MD Initiated Contact with Patient 07/06/11 1633      Chief Complaint  Patient presents with  . Foreign Body    (Consider location/radiation/quality/duration/timing/severity/associated sxs/prior treatment) Patient is a 41 y.o. female presenting with foot injury. The history is provided by the patient. No language interpreter was used.  Foot Injury  The incident occurred 3 to 5 hours ago. The incident occurred at home. The injury mechanism was an incision. The pain is present in the left foot. The pain is at a severity of 4/10. The pain is moderate. Pertinent negatives include no numbness. Possible foreign bodies include glass. She has tried nothing for the symptoms. The treatment provided moderate relief.   Pt complains of small pieces of glass in her foot Past Medical History  Diagnosis Date  . Hypothyroidism   . GERD (gastroesophageal reflux disease)     frequently but not on meds  . Headache     has been a long time  . Anxiety   . Depression 2003    bi-polar  . Bipolar 1 disorder     per pt. - she is Bioplar Type II.  Marland Kitchen Borderline personality disorder   . NAFL (nonalcoholic fatty liver)   . Premature ovarian failure   . Low grade squamous intraepithelial dysplasia     Past Surgical History  Procedure Date  . Cholecystectomy 2011  . Fracture surgery 2008    left foot  . Hysteroscopy w/d&c   . Hysteroscopy w/d&c 10/17/2010    Procedure: DILATATION AND CURETTAGE (D&C) /HYSTEROSCOPY;  Surgeon: Jessee Avers;  Location: [facility];  Service: Gynecology;  Laterality: N/A;  . Dilation and curettage of uterus   . Colposcopy   . Foot surgery     x 2  . Gallstones   . Leep 01/09/2011    Procedure: LOOP ELECTROSURGICAL EXCISION PROCEDURE (LEEP);  Surgeon: [name];  Location: [facility];  Service: Gynecology;  Laterality: N/A;  . Laparoscopic tubal ligation 01/09/2011    Procedure: LAPAROSCOPIC TUBAL  LIGATION;  Surgeon: [name];  Location: [facility];  Service: Gynecology;  Laterality: Bilateral;  with Filshie clips    Family History  Problem Relation Age of Onset  . Cancer Mother   . Mental illness Mother   . Diabetes Father     History  Substance Use Topics  . Smoking status: Current Everyday Smoker -- 0.5 packs/day for 25 years    Types: Cigarettes  . Smokeless tobacco: Never Used  . Alcohol Use: No    OB History    Grav Para Term Preterm Abortions TAB SAB Ect Mult Living   0               Review of Systems  Skin: Positive for wound.  Neurological: Negative for numbness.  All other systems reviewed and are negative.    Allergies  Codeine; Hydrocodone; Oxycodone; Trazodone and nefazodone; and Latex  Home Medications   Current Outpatient Rx  Name Route Sig Dispense Refill  . ARIPIPRAZOLE 10 MG PO TABS Oral Take 10 mg by mouth daily.    . BUPROPION HCL ER (XL) 300 MG PO TB24 Oral Take 300 mg by mouth daily.    Marland Kitchen CALCIUM CARBONATE ANTACID 500 MG PO CHEW Oral Chew 3 tablets by mouth 3 (three) times daily as needed. For nervous stomach    . DIAZEPAM 10 MG PO TABS Oral Take  10 mg by mouth 3 (three) times daily. At 7 am, 1 pm, and 6 pm    . DULOXETINE HCL 60 MG PO CPEP Oral Take 60 mg by mouth daily.    . IBUPROFEN 800 MG PO TABS Oral Take 800 mg by mouth every 8 (eight) hours as needed. For pain    . KETOCONAZOLE 2 % EX CREA Topical Apply 1 application topically 2 (two) times a week. For eczema    . PRESCRIPTION MEDICATION Topical Apply 1 application topically 5 (five) times daily. Triamcinolone and Cerave compound for eczema      BP 111/79  Pulse 94  Temp(Src) 97.7 F (36.5 C) (Oral)  Resp 16  Ht 5\' 4"  (1.626 m)  Wt 270 lb (122.471 kg)  BMI 46.35 kg/m2  SpO2 97%  Physical Exam  Nursing note and vitals reviewed. Constitutional: She appears well-developed and well-nourished.  Musculoskeletal: She exhibits tenderness.       Abrasion foot,     Neurological: She is alert.  Skin: Skin is warm.  Psychiatric: She has a normal mood and affect.    ED Course  FOREIGN BODY REMOVAL Date/Time: 07/06/2011 11:47 PM Performed by: Elson Areas Authorized by: Elson Areas Consent: Verbal consent obtained. Time out: Immediately prior to procedure a "time out" was called to verify the correct patient, procedure, equipment, support staff and site/side marked as required. Body area: skin Anesthesia: local infiltration Local anesthetic: lidocaine 2% with epinephrine 1 objects recovered. Objects recovered: glass Post-procedure assessment: foreign body removed Comments: There may still be one small foreign body that I can not find.  I advised pt if it gives her problems, she can follow up with orthopaedist for evaluation   (including critical care time)  Labs Reviewed - No data to display No results found.   No diagnosis found.    MDM          Elson Areas, PA 07/06/11 2351

## 2011-07-06 NOTE — ED Notes (Signed)
Pt states she stepped on a piece of glass from a broken ash tray. C/O pain and?FB in left foot.

## 2011-07-06 NOTE — Discharge Instructions (Signed)
Foreign Body  A foreign body is something in your body that should not be there. This may have been caused by a puncture wound or other injury. Puncture wounds become easily infected. This happens when bacteria (germs) get under the skin. Rusty nails and similar foreign bodies are often dirty and carry germs on them.   TREATMENT    A foreign body is usually removed if this can be easily done right after it happens.   Sometimes they are left in and removed at a later surgery. They may be left in indefinitely if they will not cause later problems.   The following are general instructions in caring for your wound.  HOME CARE INSTRUCTIONS    A dressing, depending on the location of the wound, may have been applied. This may be changed once per day or as instructed. If the dressing sticks, it may be soaked off with soapy water or hydrogen peroxide.   Only take over-the-counter or prescription medicines for pain, discomfort, or fever as directed by your caregiver.   Be aware that your body will work to remove the foreign substance. That is, the foreign body may work itself out of the wound. That is normal.   You may have received a recommendation to follow up with your physician or a specialist. It is very important to call for or keep follow-up appointments in order to avoid infection or other complications.  SEEK IMMEDIATE MEDICAL CARE IF:    There is redness, swelling, or increasing pain in the wound.   You notice a foul smell coming from the wound or dressing.   Pus is coming from the wound.   An unexplained oral temperature above 102 F (38.9 C) develops, or as your caregiver suggests.   There is increasing pain in the wound.  If you did not receive a tetanus shot today because you did not recall when your last one was given, check with your caregiver's office and determine if one is needed. Generally for a "dirty" wound, you should receive a tetanus booster if you have not had one in the last five  years. If you have a "clean" wound, you should receive a tetanus booster if you have not had one within the last ten years.  If you have a foreign body that needs removal and this was not done today, make sure you know how you are to follow up and what is the plan of action for taking care of this. It is your responsibility to follow up on this.  MAKE SURE YOU:    Understand these instructions.   Will watch your condition.   Will get help right away if you are not doing well or get worse.  Document Released: 09/10/2000 Document Revised: 03/06/2011 Document Reviewed: 11/04/2007  [employer] Patient Information 2012 [employer]

## 2011-07-07 NOTE — ED Provider Notes (Signed)
Medical screening examination/treatment/procedure(s) were performed by non-physician practitioner and as supervising physician I was immediately available for consultation/collaboration.   Cloa Bushong W Nicki Gracy, MD 07/07/11 1029 

## 2011-08-21 ENCOUNTER — Emergency Department (HOSPITAL_BASED_OUTPATIENT_CLINIC_OR_DEPARTMENT_OTHER)
Admission: EM | Admit: 2011-08-21 | Discharge: 2011-08-21 | Disposition: A | Discharge status: Home / Self Care | Payer: Medicare Other | Attending: Emergency Medicine | Admitting: Emergency Medicine

## 2011-08-21 ENCOUNTER — Encounter (HOSPITAL_BASED_OUTPATIENT_CLINIC_OR_DEPARTMENT_OTHER): Payer: Self-pay | Admitting: *Deleted

## 2011-08-21 DIAGNOSIS — M545 Low back pain, unspecified: Secondary | ICD-10-CM | POA: Insufficient documentation

## 2011-08-21 DIAGNOSIS — E039 Hypothyroidism, unspecified: Secondary | ICD-10-CM | POA: Insufficient documentation

## 2011-08-21 DIAGNOSIS — F319 Bipolar disorder, unspecified: Secondary | ICD-10-CM | POA: Insufficient documentation

## 2011-08-21 DIAGNOSIS — K219 Gastro-esophageal reflux disease without esophagitis: Secondary | ICD-10-CM | POA: Insufficient documentation

## 2011-08-21 DIAGNOSIS — M79609 Pain in unspecified limb: Secondary | ICD-10-CM | POA: Insufficient documentation

## 2011-08-21 DIAGNOSIS — M5430 Sciatica, unspecified side: Secondary | ICD-10-CM | POA: Insufficient documentation

## 2011-08-21 DIAGNOSIS — M5432 Sciatica, left side: Secondary | ICD-10-CM

## 2011-08-21 MED ORDER — IBUPROFEN 800 MG PO TABS: 800.0000 mg | ORAL_TABLET | Freq: Three times a day (TID) | ORAL | Status: AC

## 2011-08-21 MED ORDER — PREDNISONE 50 MG PO TABS: 50.0000 mg | ORAL_TABLET | Freq: Every day | ORAL | Status: AC

## 2011-08-21 MED ORDER — DIAZEPAM 5 MG PO TABS: 5.0000 mg | ORAL_TABLET | Freq: Two times a day (BID) | ORAL | Status: AC | PRN

## 2011-08-21 MED ORDER — HYDROMORPHONE HCL PF 1 MG/ML IJ SOLN
1.0000 mg | Freq: Once | INTRAMUSCULAR | Status: AC
  Administered 2011-08-21: 1 mg via INTRAMUSCULAR
  Filled 2011-08-21: qty 1

## 2011-08-21 MED ORDER — DIAZEPAM 5 MG PO TABS
5.0000 mg | ORAL_TABLET | Freq: Once | ORAL | Status: AC
  Administered 2011-08-21: 5 mg via ORAL
  Filled 2011-08-21: qty 1

## 2011-08-21 MED ORDER — KETOROLAC TROMETHAMINE 60 MG/2ML IM SOLN
60.0000 mg | Freq: Once | INTRAMUSCULAR | Status: AC
  Administered 2011-08-21: 60 mg via INTRAMUSCULAR
  Filled 2011-08-21: qty 2

## 2011-08-21 MED ORDER — PREDNISONE 50 MG PO TABS
60.0000 mg | ORAL_TABLET | Freq: Once | ORAL | Status: AC
  Administered 2011-08-21: 60 mg via ORAL
  Filled 2011-08-21: qty 1

## 2011-08-21 MED ORDER — TRAMADOL HCL 50 MG PO TABS: 50.0000 mg | ORAL_TABLET | Freq: Four times a day (QID) | ORAL | Status: AC | PRN

## 2011-08-21 NOTE — ED Notes (Signed)
Pt with low back pain that radiates down left leg no known injury.

## 2011-08-21 NOTE — Discharge Instructions (Signed)

## 2011-08-21 NOTE — ED Provider Notes (Signed)
History     CSN: [id number]  Arrival date & time 08/21/11  4098   First MD Initiated Contact with Patient 08/21/11 2100      Chief Complaint  Patient presents with  . Back Pain    (Consider location/radiation/quality/duration/timing/severity/associated sxs/prior treatment) HPI Comments: Patient has had left lower back pain that radiates down her left leg since Tuesday.  Patient notes she has been lifting some boxes because she's currently in the process of moving but denies any other cause of injury.  She did sleep in an uncomfortable hospital recliner earlier this week as well.  She has no weakness or numbness in her legs.  No bladder or bowel incontinence.  No fevers.  No nausea or vomiting.  No urinary symptoms.  No prior back surgeries.  Pain is worse with lying flat and walking.  It is better by sitting upright  Patient is a 41 y.o. female presenting with back pain. The history is provided by the patient. No language interpreter was used.  Back Pain  This is a new problem. Pertinent negatives include no chest pain, no fever, no headaches, no abdominal pain and no dysuria.    Past Medical History  Diagnosis Date  . Hypothyroidism   . GERD (gastroesophageal reflux disease)     frequently but not on meds  . Headache     has been a long time  . Anxiety   . Depression 2003    bi-polar  . Bipolar 1 disorder     per pt. - she is Bioplar Type II.  Marland Kitchen Borderline personality disorder   . NAFL (nonalcoholic fatty liver)   . Premature ovarian failure   . Low grade squamous intraepithelial dysplasia     Past Surgical History  Procedure Date  . Cholecystectomy 2011  . Fracture surgery 2008    left foot  . Hysteroscopy w/d&c   . Hysteroscopy w/d&c 10/17/2010    Procedure: DILATATION AND CURETTAGE (D&C) /HYSTEROSCOPY;  Surgeon: Jessee Avers;  Location: [facility];  Service: Gynecology;  Laterality: N/A;  . Dilation and curettage of uterus   . Colposcopy   . Foot surgery     x 2  .  Gallstones   . Leep 01/09/2011    Procedure: LOOP ELECTROSURGICAL EXCISION PROCEDURE (LEEP);  Surgeon: [name];  Location: [facility];  Service: Gynecology;  Laterality: N/A;  . Laparoscopic tubal ligation 01/09/2011    Procedure: LAPAROSCOPIC TUBAL LIGATION;  Surgeon: [name];  Location: [facility];  Service: Gynecology;  Laterality: Bilateral;  with Filshie clips    Family History  Problem Relation Age of Onset  . Cancer Mother   . Mental illness Mother   . Diabetes Father     History  Substance Use Topics  . Smoking status: Current Everyday Smoker -- 0.5 packs/day for 25 years    Types: Cigarettes  . Smokeless tobacco: Never Used  . Alcohol Use: No    OB History    Grav Para Term Preterm Abortions TAB SAB Ect Mult Living   0               Review of Systems  Constitutional: Negative.  Negative for fever and chills.  HENT: Negative.   Eyes: Negative.  Negative for discharge and redness.  Respiratory: Negative.  Negative for cough and shortness of breath.   Cardiovascular: Negative.  Negative for chest pain.  Gastrointestinal: Negative.  Negative for nausea, vomiting, abdominal pain and diarrhea.  Genitourinary: Negative.  Negative  for dysuria and vaginal discharge.  Musculoskeletal: Positive for back pain.  Skin: Negative.  Negative for color change and rash.  Neurological: Negative.  Negative for syncope and headaches.  Hematological: Negative.  Negative for adenopathy.  Psychiatric/Behavioral: Negative.  Negative for confusion.  All other systems reviewed and are negative.    Allergies  Codeine; Hydrocodone; Oxycodone; Trazodone and nefazodone; and Latex  Home Medications   Current Outpatient Rx  Name Route Sig Dispense Refill  . BUPROPION HCL ER (XL) 300 MG PO TB24 Oral Take 300 mg by mouth daily.    Marland Kitchen CALCIUM CARBONATE ANTACID 500 MG PO CHEW Oral Chew 3 tablets by mouth 3 (three) times daily as needed. For nervous stomach    . CLOBETASOL  PROPIONATE 0.05 % EX CREA Topical Apply 1 application topically at bedtime as needed. For eczema    . DIAZEPAM 10 MG PO TABS Oral Take 10 mg by mouth 3 (three) times daily as needed. For anxiety    . DULOXETINE HCL 60 MG PO CPEP Oral Take 60 mg by mouth daily.    . IBUPROFEN 200 MG PO TABS Oral Take 800 mg by mouth every 6 (six) hours as needed. For pain    . OVER THE COUNTER MEDICATION Topical Apply 1 patch topically daily as needed. Salon Pas pain patches    . QUETIAPINE FUMARATE 25 MG PO TABS Oral Take 25 mg by mouth at bedtime.      BP 114/75  Pulse 84  Temp(Src) 98 F (36.7 C) (Oral)  Resp 20  SpO2 98%  Physical Exam  Nursing note and vitals reviewed. Constitutional: She is oriented to person, place, and time. She appears well-developed and well-nourished.  Non-toxic appearance. She does not have a sickly appearance.  HENT:  Head: Normocephalic and atraumatic.  Eyes: Conjunctivae, EOM and lids are normal. Pupils are equal, round, and reactive to light. No scleral icterus.  Neck: Trachea normal and normal range of motion. Neck supple.  Cardiovascular: Normal rate, regular rhythm and normal heart sounds.   Pulmonary/Chest: Effort normal and breath sounds normal. No respiratory distress.  Abdominal: Soft. Normal appearance. There is no CVA tenderness.  Musculoskeletal: Normal range of motion.       No focal tear or L-spine tenderness on exam.  Sensation  to light touch is symmetric in both lower legs.  Patient is able to ambulate here in the emergency department  Neurological: She is alert and oriented to person, place, and time. She has normal strength.  Skin: Skin is warm, dry and intact. No rash noted.  Psychiatric: She has a normal mood and affect. Her behavior is normal. Judgment and thought content normal.    ED Course  Procedures (including critical care time)  Labs Reviewed - No data to display No results found.   No diagnosis found.    MDM  Patient with symptoms  that appear to be consistent with sciatica.  She has no acute neurologic deficit at this time.  No symptoms to suggest infection.  Patient will be giving NSAIDs, muscle relaxants, pain medication and steroids to assist with her symptoms.  She's been advised that if she has persistent or worsening symptoms she will need reevaluation as she may require an MRI and further outpatient management for this process.        Nat Christen, MD 08/21/11 2132

## 2011-10-11 ENCOUNTER — Inpatient Hospital Stay (HOSPITAL_COMMUNITY): Payer: Medicare Other

## 2011-10-11 ENCOUNTER — Encounter (HOSPITAL_COMMUNITY): Payer: Self-pay | Admitting: Obstetrics and Gynecology

## 2011-10-11 ENCOUNTER — Inpatient Hospital Stay (HOSPITAL_COMMUNITY)
Admission: AD | Admit: 2011-10-11 | Discharge: 2011-10-11 | Disposition: A | Discharge status: Home / Self Care | Payer: Medicare Other | Source: Ambulatory Visit | Attending: Obstetrics and Gynecology | Admitting: Obstetrics and Gynecology

## 2011-10-11 DIAGNOSIS — N390 Urinary tract infection, site not specified: Secondary | ICD-10-CM | POA: Insufficient documentation

## 2011-10-11 DIAGNOSIS — N83209 Unspecified ovarian cyst, unspecified side: Secondary | ICD-10-CM | POA: Insufficient documentation

## 2011-10-11 DIAGNOSIS — R109 Unspecified abdominal pain: Secondary | ICD-10-CM

## 2011-10-11 DIAGNOSIS — R1031 Right lower quadrant pain: Secondary | ICD-10-CM | POA: Insufficient documentation

## 2011-10-11 DIAGNOSIS — N8353 Torsion of ovary, ovarian pedicle and fallopian tube: Secondary | ICD-10-CM | POA: Insufficient documentation

## 2011-10-11 LAB — URINALYSIS, ROUTINE W REFLEX MICROSCOPIC
Bilirubin Urine: NEGATIVE
Glucose, UA: NEGATIVE mg/dL
Hgb urine dipstick: NEGATIVE
Ketones, ur: NEGATIVE mg/dL
Protein, ur: NEGATIVE mg/dL
Urobilinogen, UA: 0.2 mg/dL (ref 0.0–1.0)

## 2011-10-11 LAB — CBC
MCHC: 32.9 g/dL (ref 30.0–36.0)
Platelets: 264 10*3/uL (ref 150–400)
RDW: 12.9 % (ref 11.5–15.5)

## 2011-10-11 LAB — POCT PREGNANCY, URINE: Preg Test, Ur: NEGATIVE

## 2011-10-11 MED ORDER — HYDROMORPHONE HCL 4 MG PO TABS: 4.0000 mg | ORAL_TABLET | ORAL | Status: AC | PRN

## 2011-10-11 MED ORDER — KETOROLAC TROMETHAMINE 60 MG/2ML IM SOLN
60.0000 mg | Freq: Once | INTRAMUSCULAR | Status: AC
  Administered 2011-10-11: 60 mg via INTRAMUSCULAR
  Filled 2011-10-11: qty 2

## 2011-10-11 NOTE — MAU Note (Signed)
"  I have been hurting on my right side for about a week now.  I went to see Dr. Marcelle Overlie on Thursday, because that was the worse day.  He ruled out ectopic pregnancy nor my appendix; by having me pee in a cup and he took blood.  He said he thought it might be that I have a cyst that burst, but he wasn't able to tell that day.  He told me to return on Monday 10/13/11 for an u/s to find out for sure.  This morning has been the worst day yet, so I called the office and they told me to come here.  No bleeding, N/V. Dizziness only."

## 2011-10-11 NOTE — MAU Provider Note (Signed)
History     CSN: [id number]  Arrival date and time: 10/11/11 4098   First Provider Initiated Contact with Patient 10/11/11 1032      Chief Complaint  Patient presents with  . Pelvic Pain   HPI  Lindsey Nicholson 41 y.o. presenting today for RLQ abdominal pain that she's had for several weeks, but increasing severity this am.  States primary doctor had ruled out an ectopic pregnancy and appendicitis by taking blood and urine.  Patient is scheduled for a follow up ultrasound on Monday to evaluate for ruptured ovarian cyst, but was not able wait secondary to pain 7/10 this am.  Patient sleeping when I entered the room, last took 800mg  of IBU for pain last night, nothing this am.  Has a history for abnormal pap with LEEP procedure.  Repeat due on the 18th.  One female partner in the past six months, does not protect from STI, uses tubal for pregnancy prevention.   Psych history of Bipolar, anxiety and depression.  Last valium 3 days ago.  Other psych drugs taken yesterday.    OB History    Grav Para Term Preterm Abortions TAB SAB Ect Mult Living   0               Past Medical History  Diagnosis Date  . Hypothyroidism   . GERD (gastroesophageal reflux disease)     frequently but not on meds  . Headache     has been a long time  . Anxiety   . Depression 2003    bi-polar  . Bipolar 1 disorder     per pt. - she is Bioplar Type II.  Marland Kitchen Borderline personality disorder   . NAFL (nonalcoholic fatty liver)   . Premature ovarian failure   . Low grade squamous intraepithelial dysplasia     Past Surgical History  Procedure Date  . Cholecystectomy 2011  . Fracture surgery 2008    left foot  . Hysteroscopy w/d&c   . Hysteroscopy w/d&c 10/17/2010    Procedure: DILATATION AND CURETTAGE (D&C) /HYSTEROSCOPY;  Surgeon: Jessee Avers;  Location: [facility];  Service: Gynecology;  Laterality: N/A;  . Dilation and curettage of uterus   . Colposcopy   . Foot surgery     x 2  . Gallstones   .  Leep 01/09/2011    Procedure: LOOP ELECTROSURGICAL EXCISION PROCEDURE (LEEP);  Surgeon: [name];  Location: [facility];  Service: Gynecology;  Laterality: N/A;  . Laparoscopic tubal ligation 01/09/2011    Procedure: LAPAROSCOPIC TUBAL LIGATION;  Surgeon: [name];  Location: [facility];  Service: Gynecology;  Laterality: Bilateral;  with Filshie clips    Family History  Problem Relation Age of Onset  . Cancer Mother   . Mental illness Mother   . Diabetes Father     History  Substance Use Topics  . Smoking status: Current Everyday Smoker -- 0.5 packs/day for 25 years    Types: Cigarettes  . Smokeless tobacco: Never Used  . Alcohol Use: No    Allergies:  Allergies  Allergen Reactions  . Codeine Anaphylaxis  . Hydrocodone Anaphylaxis  . Oxycodone Anaphylaxis  . Trazodone And Nefazodone Other (See Comments)    Blurry vision  . Latex Rash    Prescriptions prior to admission  Medication Sig Dispense Refill  . buPROPion (WELLBUTRIN XL) 300 MG 24 hr tablet Take 300 mg by mouth daily.      . calcium carbonate (TUMS - DOSED  IN MG ELEMENTAL CALCIUM) 500 MG chewable tablet Chew 3 tablets by mouth 3 (three) times daily as needed. For nervous stomach      . clobetasol cream (TEMOVATE) 0.05 % Apply 1 application topically at bedtime as needed. For eczema      . diazepam (VALIUM) 10 MG tablet Take 10 mg by mouth 3 (three) times daily as needed. For anxiety      . DULoxetine (CYMBALTA) 60 MG capsule Take 60 mg by mouth daily.      Marland Kitchen ibuprofen (ADVIL,MOTRIN) 200 MG tablet Take 800 mg by mouth every 6 (six) hours as needed. For pain      . QUEtiapine (SEROQUEL) 25 MG tablet Take 25 mg by mouth at bedtime.        Review of Systems  Constitutional: Negative.   HENT: Negative.   Eyes: Negative.   Respiratory: Negative.   Cardiovascular: Negative.   Gastrointestinal:       See HPI.  Genitourinary: Positive for dysuria.  Skin: Negative.   Neurological: Negative.     Endo/Heme/Allergies: Negative.   Psychiatric/Behavioral: Positive for depression. Negative for suicidal ideas and substance abuse. The patient is not nervous/anxious.    Physical Exam   Blood pressure 100/54, pulse 83, temperature 98.2 F (36.8 C), temperature source Oral, height 5' 3.75" (1.619 m), weight 129.729 kg (286 lb), last menstrual period 08/30/2011.  Physical Exam  Constitutional: She is oriented to person, place, and time. She appears well-developed and well-nourished.  HENT:  Head: Normocephalic and atraumatic.  Cardiovascular: Normal rate, regular rhythm, normal heart sounds and intact distal pulses.  Exam reveals no gallop and no friction rub.   No murmur heard. Respiratory: Effort normal and breath sounds normal. No respiratory distress.  GI: Soft. Bowel sounds are normal. She exhibits no distension and no mass. There is tenderness. There is no rebound and no guarding.       Positive McBurney's Point, R adnexal tenderness.  Musculoskeletal: Normal range of motion.  Neurological: She is alert and oriented to person, place, and time.  Skin: Skin is warm and dry.  Psychiatric: She has a normal mood and affect. Her behavior is normal. Judgment and thought content normal.       See HPI.   Speculum exam: Vagina - Small amount of creamy discharge, no odor. Cervix - No contact bleeding.  Bimanual exam: Cervix closed. Negative CMT. Uterus non tender, evaluation of size limited by body habitus. Left adnexa non tender,  Right adnexa tender, no masses bilaterally. GC/Chlam, wet prep done. Chaperone present for exam.  MAU Course  Procedures  MDM  Results for orders placed during the hospital encounter of 10/11/11 (from the past 24 hour(s))  URINALYSIS, ROUTINE W REFLEX MICROSCOPIC     Status: Normal   Collection Time   10/11/11  9:30 AM      Component Value Range   Color, Urine YELLOW  YELLOW   APPearance CLEAR  CLEAR   Specific Gravity, Urine 1.010  1.005 - 1.030    pH 6.0  5.0 - 8.0   Glucose, UA NEGATIVE  NEGATIVE mg/dL   Hgb urine dipstick NEGATIVE  NEGATIVE   Bilirubin Urine NEGATIVE  NEGATIVE   Ketones, ur NEGATIVE  NEGATIVE mg/dL   Protein, ur NEGATIVE  NEGATIVE mg/dL   Urobilinogen, UA 0.2  0.0 - 1.0 mg/dL   Nitrite NEGATIVE  NEGATIVE   Leukocytes, UA NEGATIVE  NEGATIVE  POCT PREGNANCY, URINE     Status: Normal   Collection Time  10/11/11  9:40 AM      Component Value Range   Preg Test, Ur NEGATIVE  NEGATIVE  CBC     Status: Normal   Collection Time   10/11/11 10:44 AM      Component Value Range   WBC 7.2  4.0 - 10.5 K/uL   RBC 4.11  3.87 - 5.11 MIL/uL   Hemoglobin 12.7  12.0 - 15.0 g/dL   HCT 16.1  09.6 - 04.5 %   MCV 93.9  78.0 - 100.0 fL   MCH 30.9  26.0 - 34.0 pg   MCHC 32.9  30.0 - 36.0 g/dL   RDW 40.9  81.1 - 91.4 %   Platelets 264  150 - 400 K/uL  WET PREP, GENITAL     Status: Abnormal   Collection Time   10/11/11 12:22 PM      Component Value Range   Yeast Wet Prep HPF POC NONE SEEN  NONE SEEN   Trich, Wet Prep NONE SEEN  NONE SEEN   Clue Cells Wet Prep HPF POC NONE SEEN  NONE SEEN   WBC, Wet Prep HPF POC FEW (*) NONE SEEN    *RADIOLOGY REPORT*  Clinical Data: Right-sided pelvic pain. Premature ovarian failure.  LMP 08/30/2011.  TRANSABDOMINAL AND TRANSVAGINAL ULTRASOUND OF PELVIS  Technique: Both transabdominal and transvaginal ultrasound  examinations of the pelvis were performed. Transabdominal  technique was performed for global imaging of the pelvis including  uterus, ovaries, adnexal regions, and pelvic cul-de-sac.  It was necessary to proceed with endovaginal exam following the  transabdominal exam to visualize the ovaries.  Comparison: 08/20/2006  Findings:  Uterus: 6.8 x 2.8 x 3.4 cm. No fibroids or other uterine mass  identified.  Endometrium: Double layer thickness measures 13 mm transvaginally.  Normal appearance.  Right ovary: not directly visualized by transabdominal or  transvaginal sonography,  however no adnexal mass identified.  Left ovary: 1.1 x 0.9 x 1.1 cm. Small size, but no ovarian cystic  or solid masses identified.  Other Findings: No free fluid  IMPRESSION:  1. Normal appearance of the uterus.  2. Small post menopausal appearance of the left ovary, consistent  with the patient's history of premature ovarian failure.  3. Nonvisualization of right ovary, however no adnexal mass  identified.  Original Report Authenticated By: Danae Orleans, M.D.  Spoke with Dr. Renaldo Fiddler and plan of care discussed.  Assessment and Plan   Assessment:  Abdominal Pain  Differential Diagnoses:  Ruptured ovarian cyst - U/S performed to R/O. Ovarian torsion - U/S performed to R/O Appendicitis - labs drawn, normal WBC UTI - UA R/O Dilaudid 4mg  #10  Follow up with Dr. Marcelle Overlie on Monday for possible scheduling of laparoscopy.  Servando Salina 10/11/2011, 1:06 PM   I have reviewed this patient's vital signs, nurses notes, appropriate labs and imaging. I have examined the patient and assisted the student with her assessment and plan of care. I have discussed lab and ultrasound results in detail with the patient and she voices understanding.

## 2011-10-13 ENCOUNTER — Other Ambulatory Visit (HOSPITAL_COMMUNITY): Payer: Self-pay | Admitting: Obstetrics and Gynecology

## 2011-10-13 DIAGNOSIS — R102 Pelvic and perineal pain: Secondary | ICD-10-CM

## 2011-10-13 LAB — GC/CHLAMYDIA PROBE AMP, GENITAL
Chlamydia, DNA Probe: NEGATIVE
GC Probe Amp, Genital: NEGATIVE

## 2011-10-14 ENCOUNTER — Ambulatory Visit (HOSPITAL_COMMUNITY)
Admission: RE | Admit: 2011-10-14 | Discharge: 2011-10-14 | Disposition: A | Discharge status: Home / Self Care | Payer: Medicare Other | Source: Ambulatory Visit | Attending: Obstetrics and Gynecology | Admitting: Obstetrics and Gynecology

## 2011-10-14 DIAGNOSIS — R102 Pelvic and perineal pain: Secondary | ICD-10-CM

## 2011-10-14 DIAGNOSIS — R1031 Right lower quadrant pain: Secondary | ICD-10-CM | POA: Insufficient documentation

## 2011-10-14 DIAGNOSIS — N949 Unspecified condition associated with female genital organs and menstrual cycle: Secondary | ICD-10-CM | POA: Insufficient documentation

## 2011-10-14 MED ORDER — IOHEXOL 300 MG/ML  SOLN
100.0000 mL | Freq: Once | INTRAMUSCULAR | Status: AC | PRN
  Administered 2011-10-14: 100 mL via INTRAVENOUS

## 2011-10-16 ENCOUNTER — Encounter: Payer: Self-pay | Admitting: Gastroenterology

## 2011-11-12 ENCOUNTER — Ambulatory Visit: Payer: Medicare Other | Admitting: Gastroenterology

## 2012-03-22 ENCOUNTER — Ambulatory Visit (HOSPITAL_COMMUNITY)
Admission: RE | Admit: 2012-03-22 | Discharge: 2012-03-22 | Disposition: A | Discharge status: Home / Self Care | Payer: No Typology Code available for payment source | Attending: Psychiatry | Admitting: Psychiatry

## 2012-03-22 DIAGNOSIS — F329 Major depressive disorder, single episode, unspecified: Secondary | ICD-10-CM | POA: Insufficient documentation

## 2012-03-22 DIAGNOSIS — F3289 Other specified depressive episodes: Secondary | ICD-10-CM | POA: Insufficient documentation
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
## 2012-03-29 ENCOUNTER — Other Ambulatory Visit (HOSPITAL_COMMUNITY): Payer: No Typology Code available for payment source | Attending: Psychiatry | Admitting: Psychiatry

## 2012-03-29 ENCOUNTER — Encounter (HOSPITAL_COMMUNITY): Payer: Self-pay

## 2012-03-29 DIAGNOSIS — F3130 Bipolar disorder, current episode depressed, mild or moderate severity, unspecified: Secondary | ICD-10-CM | POA: Insufficient documentation

## 2012-03-29 DIAGNOSIS — F32A Depression, unspecified: Secondary | ICD-10-CM | POA: Insufficient documentation

## 2012-03-29 DIAGNOSIS — F411 Generalized anxiety disorder: Secondary | ICD-10-CM | POA: Insufficient documentation

## 2012-03-29 DIAGNOSIS — F319 Bipolar disorder, unspecified: Secondary | ICD-10-CM

## 2012-03-29 DIAGNOSIS — F603 Borderline personality disorder: Secondary | ICD-10-CM | POA: Insufficient documentation

## 2012-03-29 DIAGNOSIS — F410 Panic disorder [episodic paroxysmal anxiety] without agoraphobia: Secondary | ICD-10-CM | POA: Insufficient documentation

## 2012-03-29 DIAGNOSIS — F329 Major depressive disorder, single episode, unspecified: Secondary | ICD-10-CM | POA: Insufficient documentation

## 2012-03-29 MED ORDER — RISPERIDONE 1 MG PO TABS: 1.0000 mg | ORAL_TABLET | Freq: Two times a day (BID) | ORAL | Status: DC
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
## 2012-03-29 NOTE — Progress Notes (Signed)
    Daily Group Progress Note  Program: IOP  Group Time: 9:00-10:30 am   Participation Level: Active  Behavioral Response: Appropriate  Type of Therapy:  Process Group  Summary of Progress: Today was patients first day in the group. She was introduced, was attentive and observed the group process.       Group Time: .sepm   Participation Level:  Active  Behavioral Response: Appropriate  Type of Therapy: Psycho-education Group  Summary of Progress: Patient participated in a goodbye ceremony to a member ending today and practiced the skill of healthy closure.  Carman Ching, LCSW
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
## 2012-03-30 ENCOUNTER — Other Ambulatory Visit (HOSPITAL_COMMUNITY): Payer: No Typology Code available for payment source | Admitting: Psychiatry

## 2012-03-30 DIAGNOSIS — F329 Major depressive disorder, single episode, unspecified: Secondary | ICD-10-CM
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
## 2012-03-31 DIAGNOSIS — Z87412 Personal history of vulvar dysplasia: Secondary | ICD-10-CM

## 2012-03-31 HISTORY — PX: OTHER SURGICAL HISTORY: SHX169

## 2012-03-31 HISTORY — DX: Personal history of vulvar dysplasia: Z87.412

## 2012-04-01 ENCOUNTER — Other Ambulatory Visit (HOSPITAL_COMMUNITY): Payer: No Typology Code available for payment source | Attending: Psychiatry | Admitting: Psychiatry

## 2012-04-01 DIAGNOSIS — F410 Panic disorder [episodic paroxysmal anxiety] without agoraphobia: Secondary | ICD-10-CM | POA: Insufficient documentation

## 2012-04-01 DIAGNOSIS — F411 Generalized anxiety disorder: Secondary | ICD-10-CM | POA: Insufficient documentation

## 2012-04-01 DIAGNOSIS — F331 Major depressive disorder, recurrent, moderate: Secondary | ICD-10-CM | POA: Insufficient documentation

## 2012-04-01 DIAGNOSIS — F3130 Bipolar disorder, current episode depressed, mild or moderate severity, unspecified: Secondary | ICD-10-CM | POA: Insufficient documentation

## 2012-04-01 DIAGNOSIS — F603 Borderline personality disorder: Secondary | ICD-10-CM | POA: Insufficient documentation

## 2012-04-01 NOTE — Progress Notes (Signed)
    Daily Group Progress Note  Program: IOP  Group Time: 0900-1030  Participation Level: Active  Behavioral Response: Appropriate  Type of Therapy:  Process Group  Summary of Progress: Patient states it was very difficult for her to motivate herself to attend IOP today.  Mentioned that tomorrow is the meeting at the attorney's office re: the prenuptial agreement.  "I just wish for it to be over."  States she plans to have a friend who is a paralegal to read over it before she signs.     Group Time: 10:45-1200  Participation Level:  Active  Behavioral Response: Appropriate  Type of Therapy: Education and Training Group  Summary of Progress: Discussed effective sleep hygiene practices:  What sleep hygiene is, why it's important, five stages of sleep, tips for a better sleep and one way he/she will implement a tip.  Chestine Spore, RITA, CNA

## 2012-04-01 NOTE — Progress Notes (Signed)
Patient ID: Lindsey Nicholson, female   DOB: Mar 19, 1971, 42 y.o.   MRN: [id number] Patient seen today.  She is doing intensive outpatient program.  She is concerned about feeling tired with Risperdal which started recently.  She's prescribed Risperdal 1 mg twice a day but she is taking only 1 mg at bedtime.  She feels very groggy in the morning .  I recommend to try half milligram for first few days and then gradually increase to 1 mg.  I also recommend to contact me if patient does not feel better.  Explain in detail the side effects and benefits of medication.  Reassurance given.

## 2012-04-02 ENCOUNTER — Other Ambulatory Visit (HOSPITAL_COMMUNITY): Payer: No Typology Code available for payment source

## 2012-04-02 ENCOUNTER — Telehealth (HOSPITAL_COMMUNITY): Payer: Self-pay | Admitting: Psychiatry

## 2012-04-05 ENCOUNTER — Other Ambulatory Visit (HOSPITAL_COMMUNITY): Payer: No Typology Code available for payment source | Admitting: Psychiatry

## 2012-04-05 DIAGNOSIS — F331 Major depressive disorder, recurrent, moderate: Secondary | ICD-10-CM

## 2012-04-05 DIAGNOSIS — F329 Major depressive disorder, single episode, unspecified: Secondary | ICD-10-CM

## 2012-04-05 NOTE — Progress Notes (Signed)
    Daily Group Progress Note  Program: IOP  Group Time: 9:00-10:30 am   Participation Level: Active  Behavioral Response: Appropriate  Type of Therapy:  Process Group  Summary of Progress: Patient expressed high stress associated with taking on the role of power of attorney over her grandfathers affairs. She talked about how she has to evict a family member today and fears violence occuring when she takes the eviction papers. She talked about chaos and drug and alcohol use within her family and described herself as the caretaker of all of them. She struggled to separate from involving herself in the chaos when group members asked why she would put herself voluntarily into a risky situation. She struggles to see how her behaviors are contributing to her anxiety.      Group Time: 10:30 am - 12:00 pm   Participation Level:  Active  Behavioral Response: Appropriate  Type of Therapy: Psycho-education Group  Summary of Progress: Patient participated in a group on Grief and Loss that was facilitated by Theda Belfast and identified current losses and affective ways of grieving.   Carman Ching, LCSW

## 2012-04-06 ENCOUNTER — Other Ambulatory Visit (HOSPITAL_COMMUNITY): Payer: No Typology Code available for payment source | Admitting: Psychiatry

## 2012-04-06 DIAGNOSIS — F331 Major depressive disorder, recurrent, moderate: Secondary | ICD-10-CM

## 2012-04-06 NOTE — Progress Notes (Signed)
    Daily Group Progress Note  Program: IOP  Group Time: 9:00-10:30 am   Participation Level: Active  Behavioral Response: Appropriate  Type of Therapy:  Process Group  Summary of Progress: Patient reports high anxiety and depression today associated with dealing with family members. Patient gave examples of how she becomes involved in family drama's and struggles to set healthy boundaries to distance herself from the chaos. She is learning boundaries and coping skills to learn how to keep herself healthier within a stressful family system.      Group Time: 10:30 am - 12:00 pm   Participation Level:  Active  Behavioral Response: Appropriate  Type of Therapy: Psycho-education Group  Summary of Progress:  Patient learned about the symptoms of depression and anxiety and how to recognize them to avoid or reduce future relapses.  Carman Ching, LCSW

## 2012-04-07 ENCOUNTER — Other Ambulatory Visit (HOSPITAL_COMMUNITY): Payer: No Typology Code available for payment source | Admitting: Psychiatry

## 2012-04-07 DIAGNOSIS — F331 Major depressive disorder, recurrent, moderate: Secondary | ICD-10-CM

## 2012-04-07 NOTE — Progress Notes (Signed)
    Daily Group Progress Note  Program: IOP  Group Time: 9:00-10:30 am   Participation Level: Active  Behavioral Response: Appropriate  Type of Therapy:  Process Group  Summary of Progress: Patient reports feeling "angry" today. She talked about how she lacks support within her family and how she seeks it through friendships instead. She identified with themes being discussed that included "not asking or accepting help when it is offered". Patient is unsure if she wants to change this behavior but explored how it is impacting her depression.      Group Time: 10:30 am - 12:00 pm   Participation Level:  Active  Behavioral Response: Appropriate  Type of Therapy: Psycho-education Group  Summary of Progress: Patient learned about mental health support groups for continuing support following ending the program.   Carman Ching, LCSW

## 2012-04-08 ENCOUNTER — Other Ambulatory Visit (HOSPITAL_COMMUNITY): Payer: No Typology Code available for payment source | Admitting: Psychiatry

## 2012-04-08 DIAGNOSIS — F331 Major depressive disorder, recurrent, moderate: Secondary | ICD-10-CM

## 2012-04-08 NOTE — Progress Notes (Signed)
    Daily Group Progress Note  Program: IOP  Group Time: 9:00-10:30 am   Participation Level: Active  Behavioral Response: Disruptive and Attention-Seeking  Type of Therapy:  Process Group  Summary of Progress: Patient became tearful as soon as the group officially came together and the emotion escalated throughout the relaxation and early portion of the group causing a great distraction. Patient had escalated so severely with her crying that she was asked to step out of the group to take a minute for herself. Patient was not able to contain her emotions until the appropriate sharing time. When patient returned to the group, her behavior was explored to determine how it impacted the other group members and to explore the unaffectiveness of how she was managing her feelings. Patient seemed to lack insight into how her behavior negatively impacted the other group members. Patient expressed great distress over being her grandfathers power of attorney and said she is going to relinquish this duty today to another family member.      Group Time: 10:30 am - 12:00 pm   Participation Level:  Minimal  Behavioral Response: Appropriate  Type of Therapy: Psycho-education Group  Summary of Progress: Patient participated in a goodbye ceremony for two members ending the group today and practiced skills of having healthy closures.   Carman Ching, LCSW

## 2012-04-08 NOTE — Progress Notes (Signed)
Patient ID: Lindsey Nicholson, female   DOB: 03-02-1971, 42 y.o.   MRN: [id number] D:  Met briefly with pt this a.m.Marland Kitchen  After this writer oriented the new patients and escorted them to group, pt was sitting in the hallway very tearful and asked if she could meet with her. Pt continues to struggle with ongoing family conflict.  Pt has yet to follow recommended suggestions that Dr. Rutherford Limerick explored with her (pt).  Pt has expressed since day one that she couldn't handle the stress of being her Grandfather's power of attorney.  Denied any SI/HI. Reiterated recommended suggestions with patient.  A:  Pt states she is planning on calling her sister today re: taking over the power of attorney responsibilities.  R:  Pt receptive.

## 2012-04-09 ENCOUNTER — Other Ambulatory Visit (HOSPITAL_COMMUNITY): Payer: No Typology Code available for payment source | Admitting: Psychiatry

## 2012-04-09 DIAGNOSIS — F331 Major depressive disorder, recurrent, moderate: Secondary | ICD-10-CM

## 2012-04-09 NOTE — Progress Notes (Signed)
    Daily Group Progress Note  Program: IOP  Group Time: 9:00-10:30 am   Participation Level: Active  Behavioral Response: Appropriate  Type of Therapy:  Process Group  Summary of Progress: Patient reports feeling less depressed and overwhelmed than yesterday after assigning her sister power of attourney over her grandfathers affairs. Patient talks about themes of lacking healthy boundaries between herself and family and friends who are in chaotic situations. Patient becomes involved in the chaos and then becomes overwhelmed. She is working on Pharmacist, community boundaries and how to set limites to reduce the amount of chaos that she allows in her life.      Group Time: 10:30 am - 12:00 pm   Participation Level:  Active  Behavioral Response: Appropriate  Type of Therapy: Psycho-education Group  Summary of Progress: Patient learned about the importance of planning for daily wellness by creating a daily wellness routine list and identifying self-care items to use in addition to this list when stress increases.  Carman Ching, LCSW

## 2012-04-12 ENCOUNTER — Other Ambulatory Visit (HOSPITAL_COMMUNITY): Payer: No Typology Code available for payment source | Admitting: Psychiatry

## 2012-04-12 DIAGNOSIS — F331 Major depressive disorder, recurrent, moderate: Secondary | ICD-10-CM

## 2012-04-12 NOTE — Progress Notes (Signed)
    Daily Group Progress Note  Program: IOP  Group Time: 9:00-10:30 am   Participation Level: Active  Behavioral Response: Appropriate  Type of Therapy:  Process Group  Summary of Progress: Patient reports a significant reduction in her depression since her life stressors working themselves out. Her sister is now the acting power of attorney and the situation with her fiance' and the prenuptial agreement is working out in her favor. She talked with a very low and calm voice today and leaned back in her chair. This was very different behavior from  the chaotic state she was in last week. Patient states she knows she will never be able to work again due to her severe social anxiety and said her depression comes from spending too much time at home.      Group Time: 10:30 am - 12:00 pm   Participation Level:  Active  Behavioral Response: Appropriate  Type of Therapy: Psycho-education Group  Summary of Progress: Patient participated in a group facilitated by Theda Belfast on Grief and Loss and learned healthy was to grieve losses.  Carman Ching, LCSW

## 2012-04-13 ENCOUNTER — Other Ambulatory Visit (HOSPITAL_COMMUNITY): Payer: No Typology Code available for payment source

## 2012-04-13 NOTE — Progress Notes (Signed)
Patient ID: Lindsey Nicholson, female   DOB: 06-02-1970, 42 y.o.   MRN: [id number] D:  Pt will be excused today through 04-15-12 due to a planned trip.  A:  Inform treatment team.

## 2012-04-14 ENCOUNTER — Other Ambulatory Visit (HOSPITAL_COMMUNITY): Payer: No Typology Code available for payment source

## 2012-04-15 ENCOUNTER — Other Ambulatory Visit (HOSPITAL_COMMUNITY): Payer: No Typology Code available for payment source

## 2012-04-16 ENCOUNTER — Other Ambulatory Visit (HOSPITAL_COMMUNITY): Payer: No Typology Code available for payment source | Admitting: Psychiatry

## 2012-04-16 ENCOUNTER — Encounter (HOSPITAL_COMMUNITY): Payer: Self-pay

## 2012-04-16 DIAGNOSIS — F319 Bipolar disorder, unspecified: Secondary | ICD-10-CM | POA: Insufficient documentation

## 2012-04-16 MED ORDER — RISPERIDONE 1 MG PO TABS: 1.0000 mg | ORAL_TABLET | ORAL | Status: DC

## 2012-04-16 NOTE — Progress Notes (Signed)
Patient ID: Lindsey Nicholson, female   DOB: 20-Dec-1970, 42 y.o.   MRN: [id number] D:  This is a 42 year old divorced caucasian female who presents with depression, insomnia, anhedonia and fatigue. Patient also has severe panic disorder with agoraphobia. Patient has been seeing Dr.Millet , psychiatrist in [place] but has now decided to switch to Dr. Jannifer Franklin in [place].  Stressors/Triggers: 1) Conflictual relationships: Pt states she is engaged and her fiance' family wants her to sign a prenup as he is a rich man patient is upset about that and feels like she is being called at gold digger.and her other stressor is her family of origin her biological mother and her maternal aunt keep asking her for money as she is the executor of her grandfather's estate.  Patient is also upset about her fianc's friend who talks about her and says that they don't like the fact that she does not attend church and has multiple piercings. Patient has been feeling overwhelmed with all of the stressors.  Reports that she's been sleeping a lot and has been diagnosed with hypothyroidism. In the past she reports manic highs and lows and has not had a high in a few years. Patient reports that she's been feeling quite depressed lately. 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  Patient completed MH-IOP today.  Pt denies SI/HI, A/V hallucinations.  Reports improved sleep.  Continues to struggle with irritability, no motivation, and anhedonia.  States she is able to set boundaries with her family more now.  Has turned over the handling of the money to her sister. States the groups were helpful.  Would like to continue working on self care, and setting boundaries. A:  D/C pt today.  Will follow up with Dr. Jannifer Franklin during the first week of February.  Pt will call Meredith Leeds, [facility] for a follow up appointment.  Encouraged support groups.  R:  Pt receptive.

## 2012-04-16 NOTE — Patient Instructions (Signed)
Patient completed MH-IOP today.  Will follow up with Dr. Jannifer Franklin the first week of February.  Pt will call Particia Nearing, [facility] for a follow up appointment.  Encouraged support groups.

## 2012-04-16 NOTE — Progress Notes (Unsigned)
    Daily Group Progress Note  Program: IOP  Group Time: 9:00-10:30 am   Participation Level: Active  Behavioral Response: Appropriate  Type of Therapy:  Process Group  Summary of Progress:  Patient participated in a goodbye ceremony for two members ending the group today and expressed feelings associated with them leaving the group and expressed how they had both impacted patient during their time in the program.       Group Time: 10:30 am - 12:00 pm   Participation Level:  Active  Behavioral Response: Appropriate  Type of Therapy: Psycho-education Group  Summary of Progress:  Patient learned the skill of healthy boundary setting and how to set healthy limits with others to ensure their own personal wellness.   Rikia Sukhu E, LCSW 

## 2012-04-16 NOTE — Progress Notes (Deleted)
  [facility] Intensive Outpatient Program Discharge Summary  Lindsey Nicholson [id number]  Admission date: *** Discharge date: ***  Reason for admission: ***  Chemical Use History: ***  Family of Origin Issues: ***  Progress in Program Toward Treatment Goals: ***  Progress (rationale): ***    [facility] 04/16/2012

## 2012-04-16 NOTE — Progress Notes (Unsigned)
[facility] MD Progress Note  04/16/2012 9:39 AM Lindsey Nicholson  MRN:  [id number] Subjective:  Ready for discharge Diagnosis:  {Axis Diagnosis:3049000} The patient is a 42 year old white single woman who has a boyfriend who is here in the IOP program for about 12 days. For the previous 3 days she cared for a sick dog and was not here. At this time sig knowledge is that she is ready for discharge. Her major change was to take a position with her family in regards to guardianship of her grandfather. She is now splitting his responsibility with her sister, Clide Cliff. The patient got a lot of support from this IOP group to do just that. On presentation she was persistently depressed but was not suicidal. She felt anxious and overwhelmed. There is no evidence of psychosis. At this time the patient is going to a change her psychiatrist and is going to start with Dr. Malachy Moan who she is seen while in the IOP program. He in fact adjusted her medications by increasing her Cymbalta to 120 mg and increased her Wellbutrin to 450 mg. In the IOP program she was started on Risperdal and the doses were modulated at this time part 1 mg. Her psychiatrist that she is going to go to his awareness. It is noted that this is the patient's third IOP experience and that she is a significant past psychiatric history including being on disability for bipolar disorder, having 2 past psychiatric hospitalizations, multiple suicide attempts in a treatment series with the ECT. The patient feels much improved. ADL's:  Intact  Sleep: Good  Appetite:  Good  Suicidal Ideation:  no Homicidal Ideation:  no AEB (as evidenced by):  Psychiatric Specialty Exam: ROS  There were no vitals taken for this visit.There is no height or weight on file to calculate BMI.  General Appearance: Bizarre  Eye Contact::  Good  Speech:  NA  Volume:  Normal  Mood:  Euthymic  Affect:  Congruent  Thought Process:  Goal Directed  Orientation:  Full (Time,  Place, and Person)  Thought Content:  WDL  Suicidal Thoughts:  No  Homicidal Thoughts:  No  Memory:  nl  Judgement:  Good  Insight:  Good  Psychomotor Activity:  Normal  Concentration:  Good  Recall:  Good  Akathisia:  No  Handed:  Right  AIMS (if indicated):     Assets:  Desire for Improvement  Sleep:      Current Medications: Current Outpatient Prescriptions  Medication Sig Dispense Refill  . buPROPion (WELLBUTRIN XL) 300 MG 24 hr tablet Take 300 mg by mouth daily.      . calcium carbonate (TUMS - DOSED IN MG ELEMENTAL CALCIUM) 500 MG chewable tablet Chew 3 tablets by mouth 3 (three) times daily as needed. For nervous stomach      . clobetasol cream (TEMOVATE) 0.05 % Apply 1 application topically at bedtime as needed. For eczema      . diazepam (VALIUM) 10 MG tablet Take 10 mg by mouth 3 (three) times daily as needed. For anxiety      . DULoxetine (CYMBALTA) 60 MG capsule Take 60 mg by mouth daily.      Marland Kitchen ibuprofen (ADVIL,MOTRIN) 200 MG tablet Take 800 mg by mouth every 6 (six) hours as needed. For pain      . risperiDONE (RISPERDAL) 1 MG tablet Take 1 tablet (1 mg total) by mouth 2 (two) times daily.  30 tablet  0    Lab Results: No results  found for this or any previous visit (from the past 48 hour(s)).  Physical Findings: AIMS:  , ,  ,  ,    CIWA:    COWS:     Treatment Plan Summary: This patient will continue taking Risperdal 1 mg each bedtime, Cymbalta 120 mg, Wellbutrin 450 mg, Valium 10 mg 3 times a day when necessary. She will make her a followup appointment with her therapist Meredith Leeds. The patient has a followup appointment in the next 2-3 weeks with Dr.Akintago.At this time the patient is significantly improved, is not suicidal and is functioning well.  Plan:  Medical Decision Making Problem Points:  {BHH Problem Points:20777} Data Points:  {B[facility] Data Points:20775}  I certify that inpatient services furnished can reasonably be expected to improve the  patient's condition.   Lucas Mallow 04/16/2012, 9:39 AM

## 2012-04-19 ENCOUNTER — Other Ambulatory Visit (HOSPITAL_COMMUNITY): Payer: No Typology Code available for payment source

## 2012-04-20 ENCOUNTER — Other Ambulatory Visit: Payer: Self-pay | Admitting: Obstetrics and Gynecology

## 2012-04-20 ENCOUNTER — Telehealth (HOSPITAL_COMMUNITY): Payer: Self-pay | Admitting: *Deleted

## 2012-04-20 MED ORDER — RISPERIDONE 1 MG PO TABS: 1.0000 mg | ORAL_TABLET | Freq: Every day | ORAL | Status: DC

## 2012-04-20 NOTE — Telephone Encounter (Signed)
Per Atha Starks for IOP, Dr.PLovsky ordered medication on discharge: Risperidone 1 mg daily. Contacted [facility], gave clarification to [place] in pharmacy.

## 2012-05-15 ENCOUNTER — Other Ambulatory Visit: Payer: Self-pay

## 2012-06-08 ENCOUNTER — Encounter (HOSPITAL_COMMUNITY): Payer: Self-pay | Admitting: Obstetrics and Gynecology

## 2012-06-14 NOTE — H&P (Signed)
Lindsey Nicholson CSN# [id number]  DICTATION # [id number]   [name], MD 06/14/2012 11:50 AM

## 2012-06-15 NOTE — H&P (Signed)
[name]             ACCOUNT NO.:  [id number]  MEDICAL RECORD NO.:  [id number]  LOCATION:                                 FACILITY:  PHYSICIAN:  Duke Salvia. 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  HISTORY OF PRESENT ILLNESS:  An 42 year old, G0, P0, who has had  prior tubal with LEEP performed 10/12.  Findings at the time of laparoscopy were normal.  The LEEP specimen showed focal low-grade dysplasia, margins were clear.  Last Pap November 2013, still showed mild dysplasia, positive HPV.  Recent excision of a vulvar lesion was read out as VIN-1, vulvar colposcopy demonstrated some other lesions.  She did have a CT that was done 07/13 that showed a normal CT, but ultrasound done at the same time showed  endometrium of 13 mm, which she has been concerned about, although her bleeding pattern has been normal. She presents at this time for outpatient vulvar colposcopy with excision of vulvar lesions for a history of VIN-1 along with D and C, hysteroscopy, and endometrial sampling.  This procedure including specific risks related to bleeding, infection, other complications that may require additional surgery reviewed with her, which she understands and accepts.  ALLERGIES:  Codeine, hydrocodone, Vicodin.  CURRENT MEDICATIONS:  Cymbalta, Wellbutrin, Seroquel, Synthroid, Valium p.r.n., vitamin D.  PAST SURGICAL HISTORY:  She has had foot surgery twice, cholecystectomy, D and C, hysteroscopy, along with LEEP and tubal ligation.  REVIEW OF SYSTEMS:  Significant for history of abnormal Pap, migraine headache, thyroid disease, gallbladder disease.  History of eczema and history of bipolar disorder with depression.  FAMILY HISTORY:  Positive for migraine, heart disease, asthma, hepatitis,  anemia, STD, UTI, kidney disease, arthritis, diabetes, and unspecified cancer.  SOCIAL HISTORY:  Denies drug use.  She does smoke 1/2 PPD.  Denies alcohol use.  She is divorced.  Dr. Laurann Montana is the PCP.  PHYSICAL EXAM:  VITAL SIGNS:  Temperature 98.2, blood pressure 122/74. HEENT:  Unremarkable. NECK:  Supple without masses. LUNGS:  Clear. CARDIOVASCULAR:  Regular rate and rhythm without murmurs, rubs, gallops. BREASTS:  Without masses. ABDOMEN:  Soft, flat, nontender. PELVIC:  There are some small vulvar skin lesions as noted previously. Vagina and cervix was normal.  Uterus mid position, normal size.  Adnexa negative. EXTREMITIES:  Unremarkable. NEUROLOGIC:  Unremarkable.  IMPRESSION: 1. History of VIN 1. 2. A 13-mm endometrial thickness on ultrasound from July 13.  PLAN:  D and C, hysteroscopy with endometrial sampling, vulvar colposcopy with excision of vulvar lesions.  Procedure and risks discussed as above.     Lindsey Nicholson M. Marcelle Overlie, M.D.     [facility]/MEDQ  D:  06/14/2012  T:  06/15/2012  Job:  [id number]

## 2012-06-20 ENCOUNTER — Encounter (HOSPITAL_COMMUNITY): Payer: Self-pay | Admitting: Anesthesiology

## 2012-06-20 NOTE — Anesthesia Preprocedure Evaluation (Addendum)
Anesthesia Evaluation  Patient identified by MRN, date of birth, ID band Patient awake    Reviewed: Allergy & Precautions, H&P , NPO status , Patient's Chart, lab work & pertinent test results  Airway Mallampati: I TM Distance: >3 FB Neck ROM: Full    Dental no notable dental hx. (+) Teeth Intact   Pulmonary Current Smoker,  breath sounds clear to auscultation  Pulmonary exam normal       Cardiovascular negative cardio ROS  Rhythm:Regular Rate:Normal     Neuro/Psych  Headaches, PSYCHIATRIC DISORDERS Anxiety Depression Bipolar Disorder    GI/Hepatic GERD-  Medicated and Controlled,Non--Alcoholic Fatty Liver   Endo/Other  Hypothyroidism Morbid obesity  Renal/GU negative Renal ROS  negative genitourinary   Musculoskeletal negative musculoskeletal ROS (+)   Abdominal (+) + obese,   Peds  Hematology negative hematology ROS (+)   Anesthesia Other Findings   Reproductive/Obstetrics Endometrial Thickening VIN                          Anesthesia Physical Anesthesia Plan  ASA: III  Anesthesia Plan: General   Post-op Pain Management:    Induction: Intravenous  Airway Management Planned: LMA  Additional Equipment:   Intra-op Plan:   Post-operative Plan: Extubation in OR  Informed Consent: I have reviewed the patients History and Physical, chart, labs and discussed the procedure including the risks, benefits and alternatives for the proposed anesthesia with the patient or authorized representative who has indicated his/her understanding and acceptance.   Dental advisory given  Plan Discussed with: Anesthesiologist, CRNA and Surgeon  Anesthesia Plan Comments:         Anesthesia Quick Evaluation

## 2012-06-21 ENCOUNTER — Encounter (HOSPITAL_COMMUNITY)
Admission: RE | Disposition: A | Discharge status: Home / Self Care | Payer: Self-pay | Source: Ambulatory Visit | Attending: Obstetrics and Gynecology

## 2012-06-21 ENCOUNTER — Encounter (HOSPITAL_COMMUNITY): Payer: Self-pay | Admitting: *Deleted

## 2012-06-21 ENCOUNTER — Ambulatory Visit (HOSPITAL_COMMUNITY)
Admission: RE | Admit: 2012-06-21 | Discharge: 2012-06-21 | Disposition: A | Discharge status: Home / Self Care | Payer: Medicare Other | Source: Ambulatory Visit | Attending: Obstetrics and Gynecology | Admitting: Obstetrics and Gynecology

## 2012-06-21 ENCOUNTER — Encounter (HOSPITAL_COMMUNITY): Payer: Self-pay | Admitting: Anesthesiology

## 2012-06-21 ENCOUNTER — Ambulatory Visit (HOSPITAL_COMMUNITY): Payer: Medicare Other | Admitting: Anesthesiology

## 2012-06-21 DIAGNOSIS — N9089 Other specified noninflammatory disorders of vulva and perineum: Secondary | ICD-10-CM | POA: Insufficient documentation

## 2012-06-21 DIAGNOSIS — R9389 Abnormal findings on diagnostic imaging of other specified body structures: Secondary | ICD-10-CM | POA: Insufficient documentation

## 2012-06-21 DIAGNOSIS — B977 Papillomavirus as the cause of diseases classified elsewhere: Secondary | ICD-10-CM | POA: Insufficient documentation

## 2012-06-21 DIAGNOSIS — K829 Disease of gallbladder, unspecified: Secondary | ICD-10-CM | POA: Insufficient documentation

## 2012-06-21 DIAGNOSIS — E079 Disorder of thyroid, unspecified: Secondary | ICD-10-CM | POA: Insufficient documentation

## 2012-06-21 DIAGNOSIS — N949 Unspecified condition associated with female genital organs and menstrual cycle: Secondary | ICD-10-CM | POA: Insufficient documentation

## 2012-06-21 DIAGNOSIS — N904 Leukoplakia of vulva: Secondary | ICD-10-CM | POA: Insufficient documentation

## 2012-06-21 DIAGNOSIS — N903 Dysplasia of vulva, unspecified: Secondary | ICD-10-CM

## 2012-06-21 DIAGNOSIS — N880 Leukoplakia of cervix uteri: Secondary | ICD-10-CM | POA: Insufficient documentation

## 2012-06-21 DIAGNOSIS — A630 Anogenital (venereal) warts: Secondary | ICD-10-CM | POA: Insufficient documentation

## 2012-06-21 DIAGNOSIS — N938 Other specified abnormal uterine and vaginal bleeding: Secondary | ICD-10-CM | POA: Insufficient documentation

## 2012-06-21 DIAGNOSIS — N900 Mild vulvar dysplasia: Secondary | ICD-10-CM | POA: Insufficient documentation

## 2012-06-21 DIAGNOSIS — F17200 Nicotine dependence, unspecified, uncomplicated: Secondary | ICD-10-CM | POA: Insufficient documentation

## 2012-06-21 HISTORY — PX: VULVECTOMY: SHX1086

## 2012-06-21 HISTORY — PX: HYSTEROSCOPY W/D&C: SHX1775

## 2012-06-21 LAB — PREGNANCY, URINE: Preg Test, Ur: NEGATIVE

## 2012-06-21 LAB — CBC
Hemoglobin: 13.6 g/dL (ref 12.0–15.0)
Platelets: 292 10*3/uL (ref 150–400)
RBC: 4.42 MIL/uL (ref 3.87–5.11)
WBC: 8.5 10*3/uL (ref 4.0–10.5)

## 2012-06-21 SURGERY — DILATATION AND CURETTAGE /HYSTEROSCOPY
Anesthesia: General | Site: Vagina | Wound class: Clean Contaminated

## 2012-06-21 MED ORDER — FENTANYL CITRATE 0.05 MG/ML IJ SOLN
INTRAMUSCULAR | Status: DC | PRN
  Administered 2012-06-21 (×5): 50 ug via INTRAVENOUS

## 2012-06-21 MED ORDER — GLYCINE 1.5 % IR SOLN
Status: DC | PRN
  Administered 2012-06-21: 3000 mL

## 2012-06-21 MED ORDER — KETOROLAC TROMETHAMINE 30 MG/ML IJ SOLN
INTRAMUSCULAR | Status: DC | PRN
  Administered 2012-06-21: 30 mg via INTRAMUSCULAR

## 2012-06-21 MED ORDER — LIDOCAINE HCL 0.5 % IJ SOLN
INTRAMUSCULAR | Status: AC
  Filled 2012-06-21: qty 1

## 2012-06-21 MED ORDER — PROPOFOL 10 MG/ML IV EMUL
INTRAVENOUS | Status: AC
  Filled 2012-06-21: qty 20

## 2012-06-21 MED ORDER — LIDOCAINE HCL (CARDIAC) 20 MG/ML IV SOLN
INTRAVENOUS | Status: DC | PRN
  Administered 2012-06-21: 80 mg via INTRAVENOUS

## 2012-06-21 MED ORDER — PROPOFOL 10 MG/ML IV EMUL
INTRAVENOUS | Status: DC | PRN
  Administered 2012-06-21: 200 mg via INTRAVENOUS

## 2012-06-21 MED ORDER — LIDOCAINE HCL 1 % IJ SOLN
INTRAMUSCULAR | Status: DC | PRN
  Administered 2012-06-21: 9 mL

## 2012-06-21 MED ORDER — FENTANYL CITRATE 0.05 MG/ML IJ SOLN
INTRAMUSCULAR | Status: AC
  Filled 2012-06-21: qty 5

## 2012-06-21 MED ORDER — MEPERIDINE HCL 25 MG/ML IJ SOLN: 6.2500 mg | INTRAMUSCULAR | Status: DC | PRN

## 2012-06-21 MED ORDER — FENTANYL CITRATE 0.05 MG/ML IJ SOLN: 25.0000 ug | INTRAMUSCULAR | Status: DC | PRN

## 2012-06-21 MED ORDER — KETOROLAC TROMETHAMINE 30 MG/ML IJ SOLN
INTRAMUSCULAR | Status: AC
  Filled 2012-06-21: qty 1

## 2012-06-21 MED ORDER — MIDAZOLAM HCL 2 MG/2ML IJ SOLN
INTRAMUSCULAR | Status: AC
  Filled 2012-06-21: qty 2

## 2012-06-21 MED ORDER — LIDOCAINE HCL 0.5 % IJ SOLN
INTRAMUSCULAR | Status: AC
  Filled 2012-06-21: qty 2

## 2012-06-21 MED ORDER — LACTATED RINGERS IV SOLN
INTRAVENOUS | Status: DC
  Administered 2012-06-21 (×3): via INTRAVENOUS

## 2012-06-21 MED ORDER — BUPIVACAINE HCL (PF) 0.5 % IJ SOLN
INTRAMUSCULAR | Status: AC
  Filled 2012-06-21: qty 30

## 2012-06-21 MED ORDER — DEXAMETHASONE SODIUM PHOSPHATE 10 MG/ML IJ SOLN
INTRAMUSCULAR | Status: AC
  Filled 2012-06-21: qty 1

## 2012-06-21 MED ORDER — MIDAZOLAM HCL 5 MG/5ML IJ SOLN
INTRAMUSCULAR | Status: DC | PRN
  Administered 2012-06-21: 2 mg via INTRAVENOUS

## 2012-06-21 MED ORDER — LIDOCAINE HCL (CARDIAC) 20 MG/ML IV SOLN
INTRAVENOUS | Status: AC
  Filled 2012-06-21: qty 5

## 2012-06-21 MED ORDER — ONDANSETRON HCL 4 MG/2ML IJ SOLN
INTRAMUSCULAR | Status: AC
  Filled 2012-06-21: qty 2

## 2012-06-21 MED ORDER — ONDANSETRON HCL 4 MG/2ML IJ SOLN
INTRAMUSCULAR | Status: DC | PRN
  Administered 2012-06-21: 4 mg via INTRAVENOUS

## 2012-06-21 MED ORDER — BUPIVACAINE HCL (PF) 0.25 % IJ SOLN
INTRAMUSCULAR | Status: DC | PRN
  Administered 2012-06-21: 30 mL

## 2012-06-21 MED ORDER — METOCLOPRAMIDE HCL 5 MG/ML IJ SOLN: 10.0000 mg | Freq: Once | INTRAMUSCULAR | Status: DC | PRN

## 2012-06-21 MED ORDER — DEXAMETHASONE SODIUM PHOSPHATE 4 MG/ML IJ SOLN
INTRAMUSCULAR | Status: DC | PRN
  Administered 2012-06-21: 10 mg via INTRAVENOUS

## 2012-06-21 SURGICAL SUPPLY — 36 items
BLADE SURG 15 STRL LF C SS BP (BLADE) ×1 IMPLANT
BLADE SURG 15 STRL SS (BLADE) ×1
CANISTER SUCTION 2500CC (MISCELLANEOUS) ×2 IMPLANT
CATH FOLEY LATEX FREE 14FR (CATHETERS) ×1
CATH FOLEY LF 14FR (CATHETERS) ×1 IMPLANT
CATH ROBINSON RED A/P 16FR (CATHETERS) IMPLANT
CLOTH BEACON ORANGE TIMEOUT ST (SAFETY) ×2 IMPLANT
CONTAINER PREFILL 10% NBF 15ML (MISCELLANEOUS) IMPLANT
CONTAINER PREFILL 10% NBF 60ML (FORM) ×4 IMPLANT
COUNTER NEEDLE 1200 MAGNETIC (NEEDLE) ×2 IMPLANT
DRESSING TELFA 8X3 (GAUZE/BANDAGES/DRESSINGS) ×2 IMPLANT
ELECT REM PT RETURN 9FT ADLT (ELECTROSURGICAL)
ELECTRODE REM PT RTRN 9FT ADLT (ELECTROSURGICAL) IMPLANT
GLOVE BIO SURGEON STRL SZ7 (GLOVE) ×2 IMPLANT
GLOVE BIOGEL PI IND STRL 7.0 (GLOVE) ×1 IMPLANT
GLOVE BIOGEL PI INDICATOR 7.0 (GLOVE) ×1
GOWN STRL REIN XL XLG (GOWN DISPOSABLE) ×4 IMPLANT
LOOP ANGLED CUTTING 22FR (CUTTING LOOP) IMPLANT
NEEDLE HYPO 25X1 1.5 SAFETY (NEEDLE) ×2 IMPLANT
NS IRRIG 1000ML POUR BTL (IV SOLUTION) ×2 IMPLANT
PACK HYSTEROSCOPY LF (CUSTOM PROCEDURE TRAY) ×2 IMPLANT
PACK VAGINAL MINOR WOMEN LF (CUSTOM PROCEDURE TRAY) ×2 IMPLANT
PAD OB MATERNITY 4.3X12.25 (PERSONAL CARE ITEMS) ×2 IMPLANT
PAD PREP 24X48 CUFFED NSTRL (MISCELLANEOUS) ×2 IMPLANT
PENCIL BUTTON HOLSTER BLD 10FT (ELECTRODE) IMPLANT
SUT VIC AB 0 CT1 27 (SUTURE)
SUT VIC AB 0 CT1 27XBRD ANBCTR (SUTURE) IMPLANT
SUT VIC AB 2-0 SH 27 (SUTURE)
SUT VIC AB 2-0 SH 27XBRD (SUTURE) IMPLANT
SUT VIC AB 4-0 SH 27 (SUTURE) ×1
SUT VIC AB 4-0 SH 27XANBCTRL (SUTURE) ×1 IMPLANT
SYR CONTROL 10ML LL (SYRINGE) ×2 IMPLANT
TOWEL OR 17X24 6PK STRL BLUE (TOWEL DISPOSABLE) ×4 IMPLANT
TUBING NON-CON 1/4 X 20 CONN (TUBING) IMPLANT
WATER STERILE IRR 1000ML POUR (IV SOLUTION) ×2 IMPLANT
YANKAUER SUCT BULB TIP NO VENT (SUCTIONS) IMPLANT

## 2012-06-21 NOTE — Progress Notes (Signed)
The patient was re-examined with no change in status 

## 2012-06-21 NOTE — Transfer of Care (Signed)
Immediate Anesthesia Transfer of Care Note  Patient: Lindsey Nicholson  Procedure(s) Performed: Procedure(s) with comments: DILATATION AND CURETTAGE /HYSTEROSCOPY (N/A) WIDE EXCISION VULVECTOMY (N/A) - excision of vulvar lesions,  need colposcope  Patient Location: PACU  Anesthesia Type:General  Level of Consciousness: awake, alert , oriented and patient cooperative  Airway & Oxygen Therapy: Patient Spontanous Breathing and Patient connected to nasal cannula oxygen  Post-op Assessment: Report given to PACU RN and Post -op Vital signs reviewed and stable  Post vital signs: Reviewed and stable  Complications: No apparent anesthesia complications

## 2012-06-21 NOTE — Op Note (Signed)
Preoperative diagnosis: abnormal uterine bleeding, vulvar dysplasia  Postoperative diagnosis: Same  Procedure: D&C hysteroscopy, multiple vulvar biopsies, wide excision of known VIN-I  Surgeon: Marcelle Overlie  Anesthesia: Gen.  Complications: None  Specimens removed: Endometrial curettings, for vulvar biopsies, all to pathology.  Procedure and findings:  Patient taken the operating room after an adequate level of general anesthesia was obtained with the legs in stirrups and vagina were prepped and draped in usual fashion for D&C hysteroscopy. Appropriate timeout for taken at that point. Speculum was positioned, cervix grasped with tenaculum paracervical block was then created by infiltrating at 3 and 9:00 submucosally, 5-7 cc 1% Xylocaine at each site after negative aspiration. The uterus is then sounded to 8 cm progressively dilated to a 27 Pratt dilator, the 5 mm continuous flow hysteroscope was then used to perform hysteroscopy revealing the fundus and had an arcuate appearance but there were no polyps no unusual buildup of tissue that I could discern. Sharp curettage was carried out minimal tissue sent to pathology.  A had previously performed colposcopy in the office in the VIN-I lesion and other lesions had been mapped out. The right bulbar new the perineum and VIN-I was excised in a wide the lips. There were 2 left the vulvar areas that have been mapped out which were excised as vulvar biopsy in 1 mid right vulva. After these areas were excised and closed with 4-0 Vicryl interrupted sutures, they were infiltrated locally with half percent Marcaine plain for postop analgesia. She tolerated this well went to recovery room in good condition.  Dictated with [facility]  Kris No M. Marcelle Overlie M.D. all

## 2012-06-21 NOTE — Anesthesia Postprocedure Evaluation (Signed)
  Anesthesia Post Note  Patient: Lindsey Nicholson  Procedure(s) Performed: Procedure(s) (LRB): DILATATION AND CURETTAGE /HYSTEROSCOPY (N/A) WIDE EXCISION VULVECTOMY (N/A)  Anesthesia type: GA  Patient location: PACU  Post pain: Pain level controlled  Post assessment: Post-op Vital signs reviewed  Last Vitals:  Filed Vitals:   06/21/12 0915  BP: 107/68  Pulse: 85  Temp: 36.9 C  Resp: 16    Post vital signs: Reviewed  Level of consciousness: sedated  Complications: No apparent anesthesia complications

## 2012-06-21 NOTE — Preoperative (Signed)
Beta Blockers   Reason not to administer Beta Blockers:Not Applicable 

## 2012-06-22 ENCOUNTER — Encounter (HOSPITAL_COMMUNITY): Payer: Self-pay | Admitting: Obstetrics and Gynecology

## 2013-01-02 ENCOUNTER — Encounter (HOSPITAL_BASED_OUTPATIENT_CLINIC_OR_DEPARTMENT_OTHER): Payer: Self-pay | Admitting: *Deleted

## 2013-01-02 ENCOUNTER — Emergency Department (HOSPITAL_BASED_OUTPATIENT_CLINIC_OR_DEPARTMENT_OTHER)
Admission: EM | Admit: 2013-01-02 | Discharge: 2013-01-02 | Disposition: A | Discharge status: Home / Self Care | Payer: Medicare Other | Attending: Emergency Medicine | Admitting: Emergency Medicine

## 2013-01-02 DIAGNOSIS — F411 Generalized anxiety disorder: Secondary | ICD-10-CM | POA: Insufficient documentation

## 2013-01-02 DIAGNOSIS — Z8639 Personal history of other endocrine, nutritional and metabolic disease: Secondary | ICD-10-CM | POA: Insufficient documentation

## 2013-01-02 DIAGNOSIS — G5603 Carpal tunnel syndrome, bilateral upper limbs: Secondary | ICD-10-CM

## 2013-01-02 DIAGNOSIS — Z91040 Latex allergy status: Secondary | ICD-10-CM | POA: Insufficient documentation

## 2013-01-02 DIAGNOSIS — M25439 Effusion, unspecified wrist: Secondary | ICD-10-CM | POA: Insufficient documentation

## 2013-01-02 DIAGNOSIS — F17200 Nicotine dependence, unspecified, uncomplicated: Secondary | ICD-10-CM | POA: Insufficient documentation

## 2013-01-02 DIAGNOSIS — F319 Bipolar disorder, unspecified: Secondary | ICD-10-CM | POA: Insufficient documentation

## 2013-01-02 DIAGNOSIS — Z8719 Personal history of other diseases of the digestive system: Secondary | ICD-10-CM | POA: Insufficient documentation

## 2013-01-02 DIAGNOSIS — G5600 Carpal tunnel syndrome, unspecified upper limb: Secondary | ICD-10-CM | POA: Insufficient documentation

## 2013-01-02 DIAGNOSIS — Z79899 Other long term (current) drug therapy: Secondary | ICD-10-CM | POA: Insufficient documentation

## 2013-01-02 DIAGNOSIS — Z862 Personal history of diseases of the blood and blood-forming organs and certain disorders involving the immune mechanism: Secondary | ICD-10-CM | POA: Insufficient documentation

## 2013-01-02 MED ORDER — IBUPROFEN 800 MG PO TABS: 800.0000 mg | ORAL_TABLET | Freq: Three times a day (TID) | ORAL | Status: DC

## 2013-01-02 MED ORDER — METHYLPREDNISOLONE SODIUM SUCC 125 MG IJ SOLR
125.0000 mg | Freq: Once | INTRAMUSCULAR | Status: AC
  Administered 2013-01-02: 125 mg via INTRAMUSCULAR
  Filled 2013-01-02: qty 2

## 2013-01-02 NOTE — ED Provider Notes (Signed)
CSN: [id number]     Arrival date & time 01/02/13  1557 History   None    Chief Complaint  Patient presents with  . Wrist Pain   (Consider location/radiation/quality/duration/timing/severity/associated sxs/prior Treatment) Patient is a 42 y.o. female presenting with wrist pain. The history is provided by the patient. No language interpreter was used.  Wrist Pain This is a new problem. The current episode started yesterday. The problem occurs constantly. The problem has been gradually worsening. Associated symptoms include joint swelling. Nothing aggravates the symptoms. She has tried nothing for the symptoms. The treatment provided moderate relief.   Pt complains of pain from carpal tunnel.   Pt request a shot of cortisone.  Past Medical History  Diagnosis Date  . Hypothyroidism   . GERD (gastroesophageal reflux disease)     frequently but not on meds  . Headache(784.0)     has been a long time  . Anxiety   . Depression 2003    bi-polar  . Bipolar 1 disorder     per pt. - she is Bioplar Type II.  Marland Kitchen Borderline personality disorder   . NAFL (nonalcoholic fatty liver)   . Premature ovarian failure   . Low grade squamous intraepithelial dysplasia    Past Surgical History  Procedure Laterality Date  . Cholecystectomy  2011  . Fracture surgery  2008    left foot  . Hysteroscopy w/d&c    . Hysteroscopy w/d&c  10/17/2010    Procedure: DILATATION AND CURETTAGE (D&C) /HYSTEROSCOPY;  Surgeon: Jessee Avers;  Location: [facility];  Service: Gynecology;  Laterality: N/A;  . Dilation and curettage of uterus    . Colposcopy    . Foot surgery      x 2  . Gallstones    . Leep  01/09/2011    Procedure: LOOP ELECTROSURGICAL EXCISION PROCEDURE (LEEP);  Surgeon: [name];  Location: [facility];  Service: Gynecology;  Laterality: N/A;  . Laparoscopic tubal ligation  01/09/2011    Procedure: LAPAROSCOPIC TUBAL LIGATION;  Surgeon: [name];  Location: [facility];  Service: Gynecology;   Laterality: Bilateral;  with Filshie clips  . Tubal ligation    . Hysteroscopy w/d&c N/A 06/21/2012    Procedure: DILATATION AND CURETTAGE /HYSTEROSCOPY;  Surgeon: [name], MD;  Location: [facility];  Service: Gynecology;  Laterality: N/A;  . Vulvectomy N/A 06/21/2012    Procedure: WIDE EXCISION VULVECTOMY;  Surgeon: [name], MD;  Location: [facility];  Service: Gynecology;  Laterality: N/A;  excision of vulvar lesions,  need colposcope   Family History  Problem Relation Age of Onset  . Cancer Mother   . Mental illness Mother   . Alcohol abuse Mother   . Drug abuse Mother   . Diabetes Father   . Bipolar disorder Sister   . Alcohol abuse Maternal Aunt   . Drug abuse Maternal Aunt   . Depression Maternal Aunt   . Schizophrenia Maternal Aunt   . Alcohol abuse Maternal Uncle   . Alcohol abuse Maternal Grandfather    History  Substance Use Topics  . Smoking status: Current Every Day Smoker -- 0.50 packs/day for 25 years    Types: Cigarettes  . Smokeless tobacco: Never Used  . Alcohol Use: No   OB History   Grav Para Term Preterm Abortions TAB SAB Ect Mult Living   0              Review of Systems  Musculoskeletal: Positive for joint  swelling.  All other systems reviewed and are negative.    Allergies  Codeine; Hydrocodone; Oxycodone; Trazodone and nefazodone; and Latex  Home Medications   Current Outpatient Rx  Name  Route  Sig  Dispense  Refill  . buPROPion (WELLBUTRIN XL) 300 MG 24 hr tablet   Oral   Take 300 mg by mouth daily.         . calcium carbonate (TUMS - DOSED IN MG ELEMENTAL CALCIUM) 500 MG chewable tablet   Oral   Chew 3 tablets by mouth 3 (three) times daily as needed. For nervous stomach         . clobetasol cream (TEMOVATE) 0.05 %   Topical   Apply 1 application topically at bedtime as needed. For eczema         . diazepam (VALIUM) 10 MG tablet   Oral   Take 10 mg by mouth 3 (three) times daily as needed. For anxiety          . DULoxetine (CYMBALTA) 60 MG capsule   Oral   Take 60 mg by mouth daily.         Marland Kitchen ibuprofen (ADVIL,MOTRIN) 200 MG tablet   Oral   Take 800 mg by mouth every 6 (six) hours as needed. For pain         . risperiDONE (RISPERDAL) 1 MG tablet   Oral   Take 1 tablet (1 mg total) by mouth daily.   30 tablet   3    BP 118/75  Pulse 78  Temp(Src) 98.2 F (36.8 C) (Oral)  Ht 5\' 4"  (1.626 m)  Wt 267 lb (121.11 kg)  BMI 45.81 kg/m2  SpO2 100% Physical Exam  Nursing note and vitals reviewed. Constitutional: She is oriented to person, place, and time. She appears well-developed and well-nourished.  HENT:  Head: Normocephalic.  Musculoskeletal: She exhibits tenderness.  Tender bilat wrist nv and ns intact  Neurological: She is alert and oriented to person, place, and time. She has normal reflexes.  Skin: Skin is warm.  Psychiatric: She has a normal mood and affect.    ED Course  Procedures (including critical care time) Labs Review Labs Reviewed - No data to display Imaging Review No results found.  MDM   1. Carpal tunnel syndrome on both sides    ibuprofen    Lonia Skinner [place], [place] 01/02/13 1636

## 2013-01-02 NOTE — ED Provider Notes (Signed)
Medical screening examination/treatment/procedure(s) were performed by non-physician practitioner and as supervising physician I was immediately available for consultation/collaboration.   Gaberial Cada, MD 01/02/13 1803 

## 2013-01-02 NOTE — ED Notes (Signed)
Pt c/o bil carpal tunnel pain x 1 day

## 2013-02-03 ENCOUNTER — Other Ambulatory Visit: Payer: Self-pay

## 2013-04-26 ENCOUNTER — Encounter (HOSPITAL_BASED_OUTPATIENT_CLINIC_OR_DEPARTMENT_OTHER): Payer: Self-pay | Admitting: Emergency Medicine

## 2013-04-26 ENCOUNTER — Emergency Department (HOSPITAL_BASED_OUTPATIENT_CLINIC_OR_DEPARTMENT_OTHER)
Admission: EM | Admit: 2013-04-26 | Discharge: 2013-04-26 | Disposition: A | Discharge status: Home / Self Care | Payer: Medicare Other | Attending: Emergency Medicine | Admitting: Emergency Medicine

## 2013-04-26 DIAGNOSIS — E039 Hypothyroidism, unspecified: Secondary | ICD-10-CM | POA: Insufficient documentation

## 2013-04-26 DIAGNOSIS — G8918 Other acute postprocedural pain: Secondary | ICD-10-CM | POA: Insufficient documentation

## 2013-04-26 DIAGNOSIS — F411 Generalized anxiety disorder: Secondary | ICD-10-CM | POA: Insufficient documentation

## 2013-04-26 DIAGNOSIS — F319 Bipolar disorder, unspecified: Secondary | ICD-10-CM | POA: Insufficient documentation

## 2013-04-26 DIAGNOSIS — Z8719 Personal history of other diseases of the digestive system: Secondary | ICD-10-CM | POA: Insufficient documentation

## 2013-04-26 DIAGNOSIS — M25539 Pain in unspecified wrist: Secondary | ICD-10-CM | POA: Insufficient documentation

## 2013-04-26 DIAGNOSIS — M25531 Pain in right wrist: Secondary | ICD-10-CM

## 2013-04-26 DIAGNOSIS — F17200 Nicotine dependence, unspecified, uncomplicated: Secondary | ICD-10-CM | POA: Insufficient documentation

## 2013-04-26 DIAGNOSIS — Z79899 Other long term (current) drug therapy: Secondary | ICD-10-CM | POA: Insufficient documentation

## 2013-04-26 NOTE — ED Provider Notes (Signed)
TIME SEEN: 4:15 PM  CHIEF COMPLAINT: Wrist pain  HPI: Patient is a 43 y.o. right-hand-dominant female who underwent median nerve release surgery for carpal tunnel on 04/20/2013 by Dr. Melvyn Novas.  She reports that since that time she's had increasing pain in her right wrist and feels that there is something pulling or tearing. She's concerned she may have a suture loose. She denies any redness or warmth. No numbness in her hand. No fevers or chills.  ROS: See HPI Constitutional: no fever  Eyes: no drainage  ENT: no runny nose   Cardiovascular:  no chest pain  Resp: no SOB  GI: no vomiting GU: no dysuria Integumentary: no rash  Allergy: no hives  Musculoskeletal: no leg swelling  Neurological: no slurred speech ROS otherwise negative  PAST MEDICAL HISTORY/PAST SURGICAL HISTORY:  Past Medical History  Diagnosis Date  . Hypothyroidism   . GERD (gastroesophageal reflux disease)     frequently but not on meds  . Headache(784.0)     has been a long time  . Anxiety   . Depression 2003    bi-polar  . Bipolar 1 disorder     per pt. - she is Bioplar Type II.  Marland Kitchen Borderline personality disorder   . NAFL (nonalcoholic fatty liver)   . Premature ovarian failure   . Low grade squamous intraepithelial dysplasia     MEDICATIONS:  Prior to Admission medications   Medication Sig Start Date End Date Taking? Authorizing Provider  HYDROmorphone (DILAUDID) 4 MG tablet Take by mouth every 4 (four) hours as needed for severe pain.   Yes Historical Provider, MD  levothyroxine (SYNTHROID, LEVOTHROID) 150 MCG tablet Take 150 mcg by mouth daily before breakfast.   Yes Historical Provider, MD  topiramate (TOPAMAX) 200 MG tablet Take 200 mg by mouth 2 (two) times daily.   Yes Historical Provider, MD  UNKNOWN TO PATIENT Muscle relaxer   Yes Historical Provider, MD  buPROPion (WELLBUTRIN XL) 300 MG 24 hr tablet Take 300 mg by mouth daily.    Historical Provider, MD  calcium carbonate (TUMS - DOSED IN MG  ELEMENTAL CALCIUM) 500 MG chewable tablet Chew 3 tablets by mouth 3 (three) times daily as needed. For nervous stomach    Historical Provider, MD  clobetasol cream (TEMOVATE) 0.05 % Apply 1 application topically at bedtime as needed. For eczema    Historical Provider, MD  diazepam (VALIUM) 10 MG tablet Take 10 mg by mouth 3 (three) times daily as needed. For anxiety    Historical Provider, MD  DULoxetine (CYMBALTA) 60 MG capsule Take 60 mg by mouth daily.    Historical Provider, MD  ibuprofen (ADVIL,MOTRIN) 200 MG tablet Take 800 mg by mouth every 6 (six) hours as needed. For pain    Historical Provider, MD  ibuprofen (ADVIL,MOTRIN) 800 MG tablet Take 1 tablet (800 mg total) by mouth 3 (three) times daily. 01/02/13   Elson Areas, PA-C  risperiDONE (RISPERDAL) 1 MG tablet Take 1 tablet (1 mg total) by mouth daily. 04/20/12 04/20/13  Archer Asa, MD    ALLERGIES:  Allergies  Allergen Reactions  . Codeine Anaphylaxis  . Hydrocodone Anaphylaxis  . Oxycodone Anaphylaxis  . Trazodone And Nefazodone Other (See Comments)    Blurry vision  . Latex Rash    SOCIAL HISTORY:  History  Substance Use Topics  . Smoking status: Current Every Day Smoker -- 0.50 packs/day for 25 years    Types: Cigarettes  . Smokeless tobacco: Never Used  . Alcohol  Use: No    FAMILY HISTORY: Family History  Problem Relation Age of Onset  . Cancer Mother   . Mental illness Mother   . Alcohol abuse Mother   . Drug abuse Mother   . Diabetes Father   . Bipolar disorder Sister   . Alcohol abuse Maternal Aunt   . Drug abuse Maternal Aunt   . Depression Maternal Aunt   . Schizophrenia Maternal Aunt   . Alcohol abuse Maternal Uncle   . Alcohol abuse Maternal Grandfather     EXAM: BP 102/74  Pulse 98  Temp(Src) 97.8 F (36.6 C) (Oral)  Resp 18  Ht 5\' 4"  (1.626 m)  Wt 251 lb (113.853 kg)  BMI 43.06 kg/m2  SpO2 100% CONSTITUTIONAL: Alert and oriented and responds appropriately to questions.  Well-appearing; well-nourished HEAD: Normocephalic EYES: Conjunctivae clear, PERRL ENT: normal nose; no rhinorrhea; moist mucous membranes; pharynx without lesions noted NECK: Supple, no meningismus, no LAD  CARD: RRR; S1 and S2 appreciated; no murmurs, no clicks, no rubs, no gallops RESP: Normal chest excursion without splinting or tachypnea; breath sounds clear and equal bilaterally; no wheezes, no rhonchi, no rales,  ABD/GI: Normal bowel sounds; non-distended; soft, non-tender, no rebound, no guarding BACK:  The back appears normal and is non-tender to palpation, there is no CVA tenderness EXT: After unwrapping patient's dressing, she has a healing surgical scar to her right palm with intact sutures with no redness or induration or erythema or drainage, no bleeding, normal capillary refill sensation in her fingertips and hand and wrist is normal, slightly decreased range of motion in the right wrist secondary to pain but no obvious large joint effusion, otherwise Normal ROM in all joints; non-tender to palpation; no edema; normal capillary refill; no cyanosis    SKIN: Normal color for age and race; warm NEURO: Moves all extremities equally PSYCH: The patient's mood and manner are appropriate. Grooming and personal hygiene are appropriate.  MEDICAL DECISION MAKING: Patient here with pain after wrist surgery 6 days ago. After her dressing was removed, patient has had complete relief of her symptoms. Her sutures are intact. Her incision is clean, dry and intact. Patient had just discussed with Dr. Glenna Durandrtmann's office who recommends close outpatient followup and that many of her symptoms were normal. Will redress her wound and have her followup with your orthopedist as an outpatient. Given return precautions. Patient is pleased and comfortable with plan.      Lindsey MawKristen N Romello Hoehn, DO 04/26/13 [phone]

## 2013-04-26 NOTE — ED Notes (Signed)
Pt states she has carpal tunnel surgery last Wed-right wrist with increased pain-has post op dressing in place in place-states she called Dr Gilman SchmidtFred Ortman office-call was not not returned

## 2013-04-26 NOTE — ED Notes (Signed)
MD at bedside. 

## 2013-04-26 NOTE — Discharge Instructions (Signed)
Carpal Tunnel Release (Repair), Care After Refer to this sheet in the next few weeks. These discharge instructions provide you with general information on caring for yourself after you leave the hospital. Your caregiver may also give you specific instructions. Your treatment has been planned according to the most current medical practices available, but unavoidable complications sometimes occur. If you have any problems or questions after discharge, please call your caregiver. HOME CARE INSTRUCTIONS   Have a responsible person with you for 24 hours.  Do not drive a car or take public transportation for 24 hours.  Only take over-the-counter or prescription medicines for pain, discomfort, or fever as directed by your caregiver. Take them as directed.  You may put ice on the palm side of the affected wrist.  Put ice in a plastic bag.  Place a towel between your skin and the bag.  Leave the ice on for 15-20 minutes, 03-04 times per day.  If you were given a splint to keep your wrist from bending, use it as directed. It is important to wear the splint at night or as directed. Use the splint for as long as you have pain or numbness in your hand, arm, or wrist. This may take 1 to 2 months.  Keep your hand raised (elevated) above the level of your heart as much as possible. This keeps swelling down and helps with discomfort.  Change bandages (dressings) as directed.  Keep the wound clean and dry. SEEK MEDICAL CARE IF:   You develop pain not relieved with medicines.  You develop numbness of your hand.  You develop bleeding from your surgical site.  You have an oral temperature above 102 F (38.9 C).  You develop redness or swelling of the surgical site.  You develop new, unexplained problems. SEEK IMMEDIATE MEDICAL CARE IF:   You develop a rash.  You have difficulty breathing.  You develop any reaction or side effects to medicines given. MAKE SURE YOU:   Understand these  instructions.  Will watch your condition.  Will get help right away if you are not doing well or get worse. Document Released: 10/04/2004 Document Revised: 01/05/2013 Document Reviewed: 01/21/2007 [facility] Patient Information 2014 [place], [place]. RICE: Routine Care for Injuries The routine care of many injuries includes Rest, Ice, Compression, and Elevation (RICE). HOME CARE INSTRUCTIONS  Rest is needed to allow your body to heal. Routine activities can usually be resumed when comfortable. Injured tendons and bones can take up to 6 weeks to heal. Tendons are the cord-like structures that attach muscle to bone.  Ice following an injury helps keep the swelling down and reduces pain.  Put ice in a plastic bag.  Place a towel between your skin and the bag.  Leave the ice on for 15-20 minutes, 03-04 times a day. Do this while awake, for the first 24 to 48 hours. After that, continue as directed by your caregiver.  Compression helps keep swelling down. It also gives support and helps with discomfort. If an elastic bandage has been applied, it should be removed and reapplied every 3 to 4 hours. It should not be applied tightly, but firmly enough to keep swelling down. Watch fingers or toes for swelling, bluish discoloration, coldness, numbness, or excessive pain. If any of these problems occur, remove the bandage and reapply loosely. Contact your caregiver if these problems continue.  Elevation helps reduce swelling and decreases pain. With extremities, such as the arms, hands, legs, and feet, the injured area should be  placed near or above the level of the heart, if possible. SEEK IMMEDIATE MEDICAL CARE IF:  You have persistent pain and swelling.  You develop redness, numbness, or unexpected weakness.  Your symptoms are getting worse rather than improving after several days. These symptoms may indicate that further evaluation or further X-rays are needed. Sometimes, X-rays may not show a  small broken bone (fracture) until 1 week or 10 days later. Make a follow-up appointment with your caregiver. Ask when your X-ray results will be ready. Make sure you get your X-ray results. Document Released: 06/29/2000 Document Revised: 06/09/2011 Document Reviewed: 08/16/2010 [facility] Patient Information 2014 [place], [place].

## 2013-05-18 ENCOUNTER — Encounter (HOSPITAL_BASED_OUTPATIENT_CLINIC_OR_DEPARTMENT_OTHER): Payer: Self-pay | Admitting: Emergency Medicine

## 2013-05-18 ENCOUNTER — Emergency Department (HOSPITAL_BASED_OUTPATIENT_CLINIC_OR_DEPARTMENT_OTHER)
Admission: EM | Admit: 2013-05-18 | Discharge: 2013-05-18 | Disposition: A | Discharge status: Home / Self Care | Payer: Medicare Other | Attending: Emergency Medicine | Admitting: Emergency Medicine

## 2013-05-18 DIAGNOSIS — E039 Hypothyroidism, unspecified: Secondary | ICD-10-CM | POA: Insufficient documentation

## 2013-05-18 DIAGNOSIS — F319 Bipolar disorder, unspecified: Secondary | ICD-10-CM | POA: Insufficient documentation

## 2013-05-18 DIAGNOSIS — R102 Pelvic and perineal pain: Secondary | ICD-10-CM

## 2013-05-18 DIAGNOSIS — K219 Gastro-esophageal reflux disease without esophagitis: Secondary | ICD-10-CM | POA: Insufficient documentation

## 2013-05-18 DIAGNOSIS — N949 Unspecified condition associated with female genital organs and menstrual cycle: Secondary | ICD-10-CM | POA: Insufficient documentation

## 2013-05-18 DIAGNOSIS — F411 Generalized anxiety disorder: Secondary | ICD-10-CM | POA: Insufficient documentation

## 2013-05-18 DIAGNOSIS — Z791 Long term (current) use of non-steroidal anti-inflammatories (NSAID): Secondary | ICD-10-CM | POA: Insufficient documentation

## 2013-05-18 DIAGNOSIS — Z8719 Personal history of other diseases of the digestive system: Secondary | ICD-10-CM | POA: Insufficient documentation

## 2013-05-18 DIAGNOSIS — F17200 Nicotine dependence, unspecified, uncomplicated: Secondary | ICD-10-CM | POA: Insufficient documentation

## 2013-05-18 DIAGNOSIS — Z79899 Other long term (current) drug therapy: Secondary | ICD-10-CM | POA: Insufficient documentation

## 2013-05-18 DIAGNOSIS — Z91040 Latex allergy status: Secondary | ICD-10-CM | POA: Insufficient documentation

## 2013-05-18 LAB — URINALYSIS, ROUTINE W REFLEX MICROSCOPIC
BILIRUBIN URINE: NEGATIVE
GLUCOSE, UA: NEGATIVE mg/dL
KETONES UR: NEGATIVE mg/dL
Leukocytes, UA: NEGATIVE
Nitrite: NEGATIVE
PROTEIN: NEGATIVE mg/dL
Specific Gravity, Urine: 1.021 (ref 1.005–1.030)
Urobilinogen, UA: 1 mg/dL (ref 0.0–1.0)
pH: 5.5 (ref 5.0–8.0)

## 2013-05-18 LAB — URINE MICROSCOPIC-ADD ON

## 2013-05-18 MED ORDER — LIDOCAINE 5 % EX OINT: 1.0000 "application " | TOPICAL_OINTMENT | Freq: Two times a day (BID) | CUTANEOUS | Status: DC | PRN

## 2013-05-18 MED ORDER — IBUPROFEN 600 MG PO TABS: 600.0000 mg | ORAL_TABLET | Freq: Four times a day (QID) | ORAL | Status: DC | PRN

## 2013-05-18 MED ORDER — HYDROMORPHONE HCL PF 2 MG/ML IJ SOLN
2.0000 mg | Freq: Once | INTRAMUSCULAR | Status: AC
  Administered 2013-05-18: 2 mg via INTRAMUSCULAR
  Filled 2013-05-18: qty 1

## 2013-05-18 NOTE — ED Notes (Signed)
Vaginal pain since 11pm, no known foreign body. Denies urinary sxs, no vaginal bleeding

## 2013-05-18 NOTE — Discharge Instructions (Signed)
Pelvic Pain, Female °Female pelvic pain can be caused by many different things and start from a variety of places. Pelvic pain refers to pain that is located in the lower half of the abdomen and between your hips. The pain may occur over a short period of time (acute) or may be reoccurring (chronic). The cause of pelvic pain may be related to disorders affecting the female reproductive organs (gynecologic), but it may also be related to the bladder, kidney stones, an intestinal complication, or muscle or skeletal problems. Getting help right away for pelvic pain is important, especially if there has been severe, sharp, or a sudden onset of unusual pain. It is also important to get help right away because some types of pelvic pain can be life threatening.  °CAUSES  °Below are only some of the causes of pelvic pain. The causes of pelvic pain can be in one of several categories.  °· Gynecologic. °· Pelvic inflammatory disease. °· Sexually transmitted infection. °· Ovarian cyst or a twisted ovarian ligament (ovarian torsion). °· Uterine lining that grows outside the uterus (endometriosis). °· Fibroids, cysts, or tumors. °· Ovulation. °· Pregnancy. °· Pregnancy that occurs outside the uterus (ectopic pregnancy). °· Miscarriage. °· Labor. °· Abruption of the placenta or ruptured uterus. °· Infection. °· Uterine infection (endometritis). °· Bladder infection. °· Diverticulitis. °· Miscarriage related to a uterine infection (septic abortion). °· Bladder. °· Inflammation of the bladder (cystitis). °· Kidney stone(s). °· Gastrointenstinal. °· Constipation. °· Diverticulitis. °· Neurologic. °· Trauma. °· Feeling pelvic pain because of mental or emotional causes (psychosomatic). °· Cancers of the bowel or pelvis. °EVALUATION  °Your caregiver will want to take a careful history of your concerns. This includes recent changes in your health, a careful gynecologic history of your periods (menses), and a sexual history. Obtaining  your family history and medical history is also important. Your caregiver may suggest a pelvic exam. A pelvic exam will help identify the location and severity of the pain. It also helps in the evaluation of which organ system may be involved. In order to identify the cause of the pelvic pain and be properly treated, your caregiver may order tests. These tests may include:  °· A pregnancy test. °· Pelvic ultrasonography. °· An X-ray exam of the abdomen. °· A urinalysis or evaluation of vaginal discharge. °· Blood tests. °HOME CARE INSTRUCTIONS  °· Only take over-the-counter or prescription medicines for pain, discomfort, or fever as directed by your caregiver.   °· Rest as directed by your caregiver.   °· Eat a balanced diet.   °· Drink enough fluids to make your urine clear or pale yellow, or as directed.   °· Avoid sexual intercourse if it causes pain.   °· Apply warm or cold compresses to the lower abdomen depending on which one helps the pain.   °· Avoid stressful situations.   °· Keep a journal of your pelvic pain. Write down when it started, where the pain is located, and if there are things that seem to be associated with the pain, such as food or your menstrual cycle. °· Follow up with your caregiver as directed.   °SEEK MEDICAL CARE IF: °· Your medicine does not help your pain. °· You have abnormal vaginal discharge. °SEEK IMMEDIATE MEDICAL CARE IF:  °· You have heavy bleeding from the vagina.   °· Your pelvic pain increases.   °· You feel lightheaded or faint.   °· You have chills.   °· You have pain with urination or blood in your urine.   °· You have uncontrolled   diarrhea or vomiting.   You have a fever or persistent symptoms for more than 3 days.  You have a fever and your symptoms suddenly get worse.   You are being physically or sexually abused.  MAKE SURE YOU:  Understand these instructions.  Will watch your condition.  Will get help if you are not doing well or get worse. Document  Released: 02/12/2004 Document Revised: 09/16/2011 Document Reviewed: 07/07/2011 [facility]Exit[facility] Patient Information 2014 [place]ExitCare, [place].  Pain of Unknown Etiology (Pain Without a Known Cause) You have come to your caregiver because of pain. Pain can occur in any part of the body. Often there is not a definite cause. If your laboratory (blood or urine) work was normal and X-rays or other studies were normal, your caregiver may treat you without knowing the cause of the pain. An example of this is the headache. Most headaches are diagnosed by taking a history. This means your caregiver asks you questions about your headaches. Your caregiver determines a treatment based on your answers. Usually testing done for headaches is normal. Often testing is not done unless there is no response to medications. Regardless of where your pain is located today, you can be given medications to make you comfortable. If no physical cause of pain can be found, most cases of pain will gradually leave as suddenly as they came.  If you have a painful condition and no reason can be found for the pain, it is important that you follow up with your caregiver. If the pain becomes worse or does not go away, it may be necessary to repeat tests and look further for a possible cause.  Only take over-the-counter or prescription medicines for pain, discomfort, or fever as directed by your caregiver.  For the protection of your privacy, test results cannot be given over the phone. Make sure you receive the results of your test. Ask how these results are to be obtained if you have not been informed. It is your responsibility to obtain your test results.  You may continue all activities unless the activities cause more pain. When the pain lessens, it is important to gradually resume normal activities. Resume activities by beginning slowly and gradually increasing the intensity and duration of the activities or exercise. During periods of severe  pain, bed rest may be helpful. Lie or sit in any position that is comfortable.  Ice used for acute (sudden) conditions may be effective. Use a large plastic bag filled with ice and wrapped in a towel. This may provide pain relief.  See your caregiver for continued problems. Your caregiver can help or refer you for exercises or physical therapy if necessary. If you were given medications for your condition, do not drive, operate machinery or power tools, or sign legal documents for 24 hours. Do not drink alcohol, take sleeping pills, or take other medications that may interfere with treatment. See your caregiver immediately if you have pain that is becoming worse and not relieved by medications. Document Released: 12/10/2000 Document Revised: 01/05/2013 Document Reviewed: 03/17/2005 [facility] HospitalExit[facility] Patient Information 2014 [place], [place].

## 2013-05-18 NOTE — ED Notes (Signed)
MD at bedside. 

## 2013-05-18 NOTE — ED Provider Notes (Addendum)
CSN: [id number]     Arrival date & time 05/18/13  0249 History   First MD Initiated Contact with Patient 05/18/13 [phone]     Chief Complaint  Patient presents with  . Vaginal Pain     (Consider location/radiation/quality/duration/timing/severity/associated sxs/prior Treatment) HPI Comments: Pt comes in with cc of vaginal pain. Pt reports sudden onset pain, right by the urethra, starting 11 pm. She denies any preceding trauma, intercourse. She has no hx of same problems.  Pt reports that the pain is severe, and she gets frequent urge to go urinate. When she is straining, she actually feels better. No dysuria, hematuria. No vaginal bleeding or discharge. No renal stones hx.  Patient is a 43 y.o. female presenting with vaginal pain. The history is provided by the patient.  Vaginal Pain Pertinent negatives include no abdominal pain and no headaches.    Past Medical History  Diagnosis Date  . Hypothyroidism   . GERD (gastroesophageal reflux disease)     frequently but not on meds  . Headache(784.0)     has been a long time  . Anxiety   . Depression 2003    bi-polar  . Bipolar 1 disorder     per pt. - she is Bioplar Type II.  Marland Kitchen Borderline personality disorder   . NAFL (nonalcoholic fatty liver)   . Premature ovarian failure   . Low grade squamous intraepithelial dysplasia    Past Surgical History  Procedure Laterality Date  . Cholecystectomy  2011  . Fracture surgery  2008    left foot  . Hysteroscopy w/d&c    . Hysteroscopy w/d&c  10/17/2010    Procedure: DILATATION AND CURETTAGE (D&C) /HYSTEROSCOPY;  Surgeon: Jessee Avers;  Location: [facility];  Service: Gynecology;  Laterality: N/A;  . Dilation and curettage of uterus    . Colposcopy    . Foot surgery      x 2  . Gallstones    . Leep  01/09/2011    Procedure: LOOP ELECTROSURGICAL EXCISION PROCEDURE (LEEP);  Surgeon: [name];  Location: [facility];  Service: Gynecology;  Laterality: N/A;  . Laparoscopic tubal ligation   01/09/2011    Procedure: LAPAROSCOPIC TUBAL LIGATION;  Surgeon: [name];  Location: [facility];  Service: Gynecology;  Laterality: Bilateral;  with [place] clips  . Tubal ligation    . Hysteroscopy w/d&c N/A 06/21/2012    Procedure: DILATATION AND CURETTAGE /HYSTEROSCOPY;  Surgeon: [name], MD;  Location: [facility];  Service: Gynecology;  Laterality: N/A;  . Vulvectomy N/A 06/21/2012    Procedure: WIDE EXCISION VULVECTOMY;  Surgeon: [name], MD;  Location: [facility];  Service: Gynecology;  Laterality: N/A;  excision of vulvar lesions,  need colposcope  . Carpal tunnel release     Family History  Problem Relation Age of Onset  . Cancer Mother   . Mental illness Mother   . Alcohol abuse Mother   . Drug abuse Mother   . Diabetes Father   . Bipolar disorder Sister   . Alcohol abuse Maternal Aunt   . Drug abuse Maternal Aunt   . Depression Maternal Aunt   . Schizophrenia Maternal Aunt   . Alcohol abuse Maternal Uncle   . Alcohol abuse Maternal Grandfather    History  Substance Use Topics  . Smoking status: Current Every Day Smoker -- 0.50 packs/day for 25 years    Types: Cigarettes  . Smokeless tobacco: Never Used  . Alcohol Use: No   OB  History   Grav Para Term Preterm Abortions TAB SAB Ect Mult Living   0              Review of Systems  Constitutional: Positive for activity change. Negative for fever.  Gastrointestinal: Negative for nausea, vomiting and abdominal pain.  Genitourinary: Positive for vaginal pain. Negative for dysuria.  Skin: Negative for rash and wound.  Neurological: Negative for headaches.  Hematological: Does not bruise/bleed easily.      Allergies  Codeine; Hydrocodone; Oxycodone; Trazodone and nefazodone; and Latex  Home Medications   Current Outpatient Rx  Name  Route  Sig  Dispense  Refill  . buPROPion (WELLBUTRIN XL) 300 MG 24 hr tablet   Oral   Take 300 mg by mouth daily.         . calcium carbonate (TUMS - DOSED IN MG  ELEMENTAL CALCIUM) 500 MG chewable tablet   Oral   Chew 3 tablets by mouth 3 (three) times daily as needed. For nervous stomach         . clobetasol cream (TEMOVATE) 0.05 %   Topical   Apply 1 application topically at bedtime as needed. For eczema         . diazepam (VALIUM) 10 MG tablet   Oral   Take 10 mg by mouth 3 (three) times daily as needed. For anxiety         . DULoxetine (CYMBALTA) 60 MG capsule   Oral   Take 60 mg by mouth daily.         Marland Kitchen. HYDROmorphone (DILAUDID) 4 MG tablet   Oral   Take by mouth every 4 (four) hours as needed for severe pain.         Marland Kitchen. ibuprofen (ADVIL,MOTRIN) 200 MG tablet   Oral   Take 800 mg by mouth every 6 (six) hours as needed. For pain         . ibuprofen (ADVIL,MOTRIN) 800 MG tablet   Oral   Take 1 tablet (800 mg total) by mouth 3 (three) times daily.   21 tablet   0   . levothyroxine (SYNTHROID, LEVOTHROID) 150 MCG tablet   Oral   Take 150 mcg by mouth daily before breakfast.         . EXPIRED: risperiDONE (RISPERDAL) 1 MG tablet   Oral   Take 1 tablet (1 mg total) by mouth daily.   30 tablet   3   . topiramate (TOPAMAX) 200 MG tablet   Oral   Take 200 mg by mouth 2 (two) times daily.         Marland Kitchen. UNKNOWN TO PATIENT      Muscle relaxer          BP 109/77  Pulse 100  Temp(Src) 97.7 F (36.5 C) (Oral)  Resp 20  SpO2 99% Physical Exam  Nursing note and vitals reviewed. Constitutional: She appears well-developed.  HENT:  Head: Atraumatic.  Neck: Neck supple.  Pulmonary/Chest: Breath sounds normal.  Abdominal: Soft. She exhibits no distension. There is no tenderness. There is no rebound and no guarding.  Genitourinary: Vagina normal. No vaginal discharge found.  Pt points her tenderness, just on top of the urethral meatus. On exam - there is no lesion/nodule/cyst/edema/erythema at the site she is pointing. + tenderness, no fluctuance.    ED Course  Procedures (including critical care time) Labs  Review Labs Reviewed  URINALYSIS, ROUTINE W REFLEX MICROSCOPIC - Abnormal; Notable for the following:    APPearance CLOUDY (*)  Hgb urine dipstick MODERATE (*)    All other components within normal limits  URINE MICROSCOPIC-ADD ON - Abnormal; Notable for the following:    Bacteria, UA FEW (*)    All other components within normal limits  URINE CULTURE   Imaging Review No results found.  EKG Interpretation   None       MDM   Final diagnoses:  None    Pt comes in with vaginal pain. Unsure etiology. Initial DDx was urethral stone, cyst. Abscess, infection or uti.  Based on the hx and exam - none of it really holds true. Urine will be cultured.  Will give im meds to see if we can control the sx. Dilaudid im. We will order some topical pain meds. i strongly urged patient to see her gynecologist for this pain.  Derwood Kaplan, MD 05/18/13 0430  5:22 AM Post IM dilaudid, feels a lot better. Will d.c home now. She has some epigastric pain - post dialudid, but it is not severe at all. She will call her Gynecologist this AM. Asked to return to the ER if the pain gets worse.   Derwood Kaplan, MD 05/18/13 [id number]

## 2013-05-19 LAB — URINE CULTURE
Colony Count: NO GROWTH
Culture: NO GROWTH
Special Requests: NORMAL

## 2013-07-24 ENCOUNTER — Emergency Department (HOSPITAL_BASED_OUTPATIENT_CLINIC_OR_DEPARTMENT_OTHER)
Admission: EM | Admit: 2013-07-24 | Discharge: 2013-07-24 | Disposition: A | Discharge status: Home / Self Care | Payer: Medicare Other | Attending: Emergency Medicine | Admitting: Emergency Medicine

## 2013-07-24 ENCOUNTER — Encounter (HOSPITAL_BASED_OUTPATIENT_CLINIC_OR_DEPARTMENT_OTHER): Payer: Self-pay | Admitting: Emergency Medicine

## 2013-07-24 ENCOUNTER — Emergency Department (HOSPITAL_BASED_OUTPATIENT_CLINIC_OR_DEPARTMENT_OTHER): Payer: Medicare Other

## 2013-07-24 DIAGNOSIS — Z8742 Personal history of other diseases of the female genital tract: Secondary | ICD-10-CM | POA: Insufficient documentation

## 2013-07-24 DIAGNOSIS — R296 Repeated falls: Secondary | ICD-10-CM | POA: Insufficient documentation

## 2013-07-24 DIAGNOSIS — Z91040 Latex allergy status: Secondary | ICD-10-CM | POA: Insufficient documentation

## 2013-07-24 DIAGNOSIS — S20219A Contusion of unspecified front wall of thorax, initial encounter: Secondary | ICD-10-CM

## 2013-07-24 DIAGNOSIS — F17200 Nicotine dependence, unspecified, uncomplicated: Secondary | ICD-10-CM | POA: Insufficient documentation

## 2013-07-24 DIAGNOSIS — Y9289 Other specified places as the place of occurrence of the external cause: Secondary | ICD-10-CM | POA: Insufficient documentation

## 2013-07-24 DIAGNOSIS — F411 Generalized anxiety disorder: Secondary | ICD-10-CM | POA: Insufficient documentation

## 2013-07-24 DIAGNOSIS — Y9302 Activity, running: Secondary | ICD-10-CM | POA: Insufficient documentation

## 2013-07-24 DIAGNOSIS — Z79899 Other long term (current) drug therapy: Secondary | ICD-10-CM | POA: Insufficient documentation

## 2013-07-24 DIAGNOSIS — E039 Hypothyroidism, unspecified: Secondary | ICD-10-CM | POA: Insufficient documentation

## 2013-07-24 DIAGNOSIS — K219 Gastro-esophageal reflux disease without esophagitis: Secondary | ICD-10-CM | POA: Insufficient documentation

## 2013-07-24 DIAGNOSIS — IMO0002 Reserved for concepts with insufficient information to code with codable children: Secondary | ICD-10-CM | POA: Insufficient documentation

## 2013-07-24 DIAGNOSIS — F3130 Bipolar disorder, current episode depressed, mild or moderate severity, unspecified: Secondary | ICD-10-CM | POA: Insufficient documentation

## 2013-07-24 DIAGNOSIS — Z791 Long term (current) use of non-steroidal anti-inflammatories (NSAID): Secondary | ICD-10-CM | POA: Insufficient documentation

## 2013-07-24 NOTE — Discharge Instructions (Signed)
Chest Contusion °A chest contusion is a deep bruise on your chest area. Contusions are the result of an injury that caused bleeding under the skin. A chest contusion may involve bruising of the skin, muscles, or ribs. The contusion may turn blue, purple, or yellow. Minor injuries will give you a painless contusion, but more severe contusions may stay painful and swollen for a few weeks. °CAUSES  °A contusion is usually caused by a blow, trauma, or direct force to an area of the body. °SYMPTOMS  °· Swelling and redness of the injured area. °· Discoloration of the injured area. °· Tenderness and soreness of the injured area. °· Pain. °DIAGNOSIS  °The diagnosis can be made by taking a history and performing a physical exam. An X-ray, CT scan, or MRI may be needed to determine if there were any associated injuries, such as broken bones (fractures) or internal injuries. °TREATMENT  °Often, the best treatment for a chest contusion is resting, icing, and applying cold compresses to the injured area. Deep breathing exercises may be recommended to reduce the risk of pneumonia. Over-the-counter medicines may also be recommended for pain control. °HOME CARE INSTRUCTIONS  °· Put ice on the injured area. °· Put ice in a plastic bag. °· Place a towel between your skin and the bag. °· Leave the ice on for 15-20 minutes, 03-04 times a day. °· Only take over-the-counter or prescription medicines as directed by your caregiver. Your caregiver may recommend avoiding anti-inflammatory medicines (aspirin, ibuprofen, and naproxen) for 48 hours because these medicines may increase bruising. °· Rest the injured area. °· Perform deep-breathing exercises as directed by your caregiver. °· Stop smoking if you smoke. °· Do not lift objects over 5 pounds (2.3 kg) for 3 days or longer if recommended by your caregiver. °SEEK IMMEDIATE MEDICAL CARE IF:  °· You have increased bruising or swelling. °· You have pain that is getting worse. °· You have  difficulty breathing. °· You have dizziness, weakness, or fainting. °· You have blood in your urine or stool. °· You cough up or vomit blood. °· Your swelling or pain is not relieved with medicines. °MAKE SURE YOU:  °· Understand these instructions. °· Will watch your condition. °· Will get help right away if you are not doing well or get worse. °Document Released: 12/10/2000 Document Revised: 12/10/2011 Document Reviewed: 09/08/2011 °Exit[employer]® Patient Information ©2014 [employer]. ° °

## 2013-07-24 NOTE — ED Notes (Signed)
Fell while running down a hill yesterday, landing on her right arm which impacted into her chest wall.  Rested, applied a heating pad with no relief in symptoms.  Denies LOC.

## 2013-07-24 NOTE — ED Notes (Signed)
Pt fell down rocky hill yesterday, landing on right side and hitting right mid and upper chest and right breast.  Right arm tucked under right side during fall.  Pt c/o right side and chest pain, worse with movement and deep inspiration.

## 2013-07-24 NOTE — ED Provider Notes (Signed)
CSN: [id number]     Arrival date & time 07/24/13  1413 History   First MD Initiated Contact with Patient 07/24/13 1423     Chief Complaint  Patient presents with  . Fall      HPI  Pt presents after a fall yesterday. She was running down a hill lost her balance. She put her arms and interpreter fall. When she did her right hand became lodged underneath her right lower chest and she fell against the ground. Felt some pain in her ribs. No crepitus with breathing. She home in her lung warm shower and felt better. Pulling out of bed she had pain during the night and pain in right lower chest and ribs this morning is not short of breath. No hemoptysis. No abdominal pain.  Past Medical History  Diagnosis Date  . Hypothyroidism   . GERD (gastroesophageal reflux disease)     frequently but not on meds  . Headache(784.0)     has been a long time  . Anxiety   . Depression 2003    bi-polar  . Bipolar 1 disorder     per pt. - she is Bioplar Type II.  Marland Kitchen. Borderline personality disorder   . NAFL (nonalcoholic fatty liver)   . Premature ovarian failure   . Low grade squamous intraepithelial dysplasia    Past Surgical History  Procedure Laterality Date  . Cholecystectomy  2011  . Fracture surgery  2008    left foot  . Hysteroscopy w/d&c    . Hysteroscopy w/d&c  10/17/2010    Procedure: DILATATION AND CURETTAGE (D&C) /HYSTEROSCOPY;  Surgeon: Jessee Aversara J. Cole;  Location: [facility];  Service: Gynecology;  Laterality: N/A;  . Dilation and curettage of uterus    . Colposcopy    . Foot surgery      x 2  . Gallstones    . Leep  01/09/2011    Procedure: LOOP ELECTROSURGICAL EXCISION PROCEDURE (LEEP);  Surgeon: Meriel Picaichard M Holland;  Location: [facility];  Service: Gynecology;  Laterality: N/A;  . Laparoscopic tubal ligation  01/09/2011    Procedure: LAPAROSCOPIC TUBAL LIGATION;  Surgeon: Meriel Picaichard M Holland;  Location: [facility];  Service: Gynecology;  Laterality: Bilateral;  with Filshie clips  . Tubal ligation      . Hysteroscopy w/d&c N/A 06/21/2012    Procedure: DILATATION AND CURETTAGE /HYSTEROSCOPY;  Surgeon: Meriel Picaichard M Holland, MD;  Location: [facility];  Service: Gynecology;  Laterality: N/A;  . Vulvectomy N/A 06/21/2012    Procedure: WIDE EXCISION VULVECTOMY;  Surgeon: Meriel Picaichard M Holland, MD;  Location: [facility];  Service: Gynecology;  Laterality: N/A;  excision of vulvar lesions,  need colposcope  . Carpal tunnel release     Family History  Problem Relation Age of Onset  . Cancer Mother   . Mental illness Mother   . Alcohol abuse Mother   . Drug abuse Mother   . Diabetes Father   . Bipolar disorder Sister   . Alcohol abuse Maternal Aunt   . Drug abuse Maternal Aunt   . Depression Maternal Aunt   . Schizophrenia Maternal Aunt   . Alcohol abuse Maternal Uncle   . Alcohol abuse Maternal Grandfather    History  Substance Use Topics  . Smoking status: Current Every Day Smoker -- 0.50 packs/day for 25 years    Types: Cigarettes  . Smokeless tobacco: Never Used  . Alcohol Use: No   OB History   Grav Para Term Preterm Abortions TAB SAB Ect Mult Living  0              Review of Systems  Constitutional: Negative for fever, chills, diaphoresis, appetite change and fatigue.  HENT: Negative for mouth sores, sore throat and trouble swallowing.   Eyes: Negative for visual disturbance.  Respiratory: Negative for cough, chest tightness, shortness of breath and wheezing.   Cardiovascular: Positive for chest pain.  Gastrointestinal: Negative for nausea, vomiting, abdominal pain, diarrhea and abdominal distention.  Endocrine: Negative for polydipsia, polyphagia and polyuria.  Genitourinary: Negative for dysuria, frequency and hematuria.  Musculoskeletal: Negative for gait problem.  Skin: Negative for color change, pallor and rash.  Neurological: Negative for dizziness, syncope, light-headedness and headaches.  Hematological: Does not bruise/bleed easily.  Psychiatric/Behavioral: Negative for  behavioral problems and confusion.      Allergies  Codeine; Hydrocodone; Oxycodone; Trazodone and nefazodone; and Latex  Home Medications   Prior to Admission medications   Medication Sig Start Date End Date Taking? Authorizing Provider  buPROPion (WELLBUTRIN XL) 300 MG 24 hr tablet Take 300 mg by mouth daily.    Historical Provider, MD  calcium carbonate (TUMS - DOSED IN MG ELEMENTAL CALCIUM) 500 MG chewable tablet Chew 3 tablets by mouth 3 (three) times daily as needed. For nervous stomach    Historical Provider, MD  clobetasol cream (TEMOVATE) 0.05 % Apply 1 application topically at bedtime as needed. For eczema    Historical Provider, MD  diazepam (VALIUM) 10 MG tablet Take 10 mg by mouth 3 (three) times daily as needed. For anxiety    Historical Provider, MD  DULoxetine (CYMBALTA) 60 MG capsule Take 60 mg by mouth daily.    Historical Provider, MD  HYDROmorphone (DILAUDID) 4 MG tablet Take by mouth every 4 (four) hours as needed for severe pain.    Historical Provider, MD  ibuprofen (ADVIL,MOTRIN) 200 MG tablet Take 800 mg by mouth every 6 (six) hours as needed. For pain    Historical Provider, MD  ibuprofen (ADVIL,MOTRIN) 600 MG tablet Take 1 tablet (600 mg total) by mouth every 6 (six) hours as needed. 05/18/13   Derwood Kaplan, MD  ibuprofen (ADVIL,MOTRIN) 800 MG tablet Take 1 tablet (800 mg total) by mouth 3 (three) times daily. 01/02/13   Elson Areas, PA-C  levothyroxine (SYNTHROID, LEVOTHROID) 150 MCG tablet Take 150 mcg by mouth daily before breakfast.    Historical Provider, MD  lidocaine (XYLOCAINE) 5 % ointment Apply 1 application topically 2 (two) times daily as needed. 05/18/13   Derwood Kaplan, MD  risperiDONE (RISPERDAL) 1 MG tablet Take 1 tablet (1 mg total) by mouth daily. 04/20/12 04/20/13  Archer Asa, MD  topiramate (TOPAMAX) 200 MG tablet Take 200 mg by mouth 2 (two) times daily.    Historical Provider, MD  UNKNOWN TO PATIENT Muscle relaxer    Historical Provider,  MD   BP 110/73  Pulse 80  Temp(Src) 98.3 F (36.8 C) (Oral)  Resp 18  Ht 5\' 4"  (1.626 m)  Wt 247 lb (112.038 kg)  BMI 42.38 kg/m2  SpO2 100% Physical Exam  Constitutional: She is oriented to person, place, and time. She appears well-developed and well-nourished. No distress.  HENT:  Head: Normocephalic.  Eyes: Conjunctivae are normal. Pupils are equal, round, and reactive to light. No scleral icterus.  Neck: Normal range of motion. Neck supple. No thyromegaly present.  Cardiovascular: Normal rate and regular rhythm.  Exam reveals no gallop and no friction rub.   No murmur heard. Pulmonary/Chest: Effort normal and breath sounds normal.  No respiratory distress. She has no wheezes. She has no rales.    No subcutaneous air the neck or chest. No bony crepitus to palpate in the right lower chest. No pain in the upper abdomen.  Abdominal: Soft. Bowel sounds are normal. She exhibits no distension. There is no tenderness. There is no rebound.  Musculoskeletal: Normal range of motion.  Neurological: She is alert and oriented to person, place, and time.  Skin: Skin is warm and dry. No rash noted.  Psychiatric: She has a normal mood and affect. Her behavior is normal.    ED Course  Procedures (including critical care time) Labs Review Labs Reviewed - No data to display  Imaging Review Dg Ribs Unilateral W/chest Right  07/24/2013   CLINICAL DATA:  Fall, fall onto left side  EXAM: RIGHT RIBS AND CHEST - 3+ VIEW  COMPARISON:  None.  FINDINGS: Normal mediastinum. No pleural fluid or pulmonary contusion. No pneumothorax. Dedicated views of the right ribs demonstrate no displaced rib fracture.  IMPRESSION: 1. No radiographic evidence of thoracic trauma. 2. No evidence for right rib fracture or pneumothorax.   Electronically Signed   By: Genevive BiStewart  Edmunds M.D.   On: 07/24/2013 14:50     EKG Interpretation None      MDM   Final diagnoses:  Chest wall contusion   She rates pain at 5/10.  She has by mouth Dilaudid at home left over from a prior surgery. Has allergies to most other oral pain medicines. Is in x-ray now. Plan will be reevaluation and radiographic interpretation.At minimum, she will need pain control/pulmonary toilet at home.    Rolland PorterMark Nohelia Valenza, MD 07/24/13 [phone]

## 2013-08-25 ENCOUNTER — Other Ambulatory Visit: Payer: Self-pay | Admitting: Obstetrics and Gynecology

## 2013-09-08 ENCOUNTER — Encounter (HOSPITAL_BASED_OUTPATIENT_CLINIC_OR_DEPARTMENT_OTHER): Payer: Self-pay | Admitting: *Deleted

## 2013-09-09 ENCOUNTER — Encounter (HOSPITAL_BASED_OUTPATIENT_CLINIC_OR_DEPARTMENT_OTHER): Payer: Self-pay | Admitting: *Deleted

## 2013-09-09 NOTE — H&P (Signed)
Lindsey Nicholson  DICTATION # [id number] CSN# [id number]   Meriel PicaHOLLAND,[name], MD 09/09/2013 10:27 AM

## 2013-09-09 NOTE — Progress Notes (Signed)
NPO AFTER MN. ARRIVE AT 0600. NEEDS HG AND URINE PREG. WILL TAKE AM MEDS AND ZANTAC W/ SIPS OF WATER .

## 2013-09-12 NOTE — H&P (Signed)
Lindsey Nicholson:  RHODES, MICHELLE             ACCOUNT NO.:  [id number]  MEDICAL RECORD NO.:  [id number]  LOCATION:                                 FACILITY:  PHYSICIAN:  Duke Salviaichard M. Marcelle OverlieHolland, M.D.DATE OF BIRTH:  10-15-1970  DATE OF ADMISSION: DATE OF DISCHARGE:                             HISTORY & PHYSICAL   CHIEF COMPLAINT:  Persistent abnormal Pap.  HISTORY OF PRESENT ILLNESS:  A 43 year old, G0, P0, who has had a prior tubal.  In October 2012, this patient underwent tubal ligation by Filshie clip application and had loop excision at the same time as an outpatient.  Pathology at that time demonstrated focal low-grade dysplasia, extending to the ectocervical margin.  Endocervical margin was not involved.  Initially, had abnormal Paps, but has had persistent mild dysplasia noted, underwent followup colposcopy with biopsy in May 15, demonstrating persistence of CIN 1.  We discussed a number of options with her including cryo, repeat loop excision, even definitive hysterectomy since she does not plan to get pregnant, being status post tubal.  She prefers followup LEEP, but would prefer to be done as an outpatient with significant sedation.  This procedure including specific risks related to bleeding, infection, along with her recovery time, the need for followup cytology all discussed with her which she understands and accepts.  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  PAST MEDICAL HISTORY:  REVIEW OF SYSTEMS:  For this patient is positive for history of stress migraines, musculoskeletal pain in the right pelvis, carpal tunnel, hypothyroidism, NASH, borderline personality disorder with bipolar disease, anxiety and panic attacks, and recurrent moderate depression.  MEDICATIONS:  Wellbutrin 450 mg daily, Cymbalta 120 mg daily,  Risperdal 2 mg nightly, Valium 10 mg p.r.n., Topamax 400 mg nightly, Synthroid 150 mcg daily, hydroxyzine 25 mg p.r.n.  PAST SURGICAL HISTORY:  Left foot fracture, cholecystectomy, hysteroscopy D and C, hysteroscopy with polypectomy, outpatient colposcopy, cholecystectomy, loop excision, laparoscopic tubal ligation, carpal tunnel release, hysteroscopy, and vulvectomy.  SOCIAL HISTORY:  She is divorced and denies alcohol or drug use.  She does smoke 1/3rd pack per day.  PHYSICAL EXAMINATION:  VITAL SIGNS: Temperature 98.2, blood pressure 120/82. HEENT:  Unremarkable. NECK:  Supple without masses. LUNGS:  Clear. CARDIOVASCULAR:  Regular rate and rhythm without murmurs, rubs, gallops. BREASTS:  Without masses. ABDOMEN:  Soft, flat, nontender.  Vulva, vagina, cervix normal.  Uterus mid position, normal size.  Adnexa negative. EXTREMITIES:  Unremarkable. NEUROLOGIC:  Unremarkable.  IMPRESSION:  Persistent cervical dysplasia, status post loop electrocautery excision procedure.  PLAN:  Outpatient LEEP procedure and risks discussed as above.     Reiner Loewen M. Marcelle OverlieHolland, M.D.     RM[facility]/MEDQ  D:  09/09/2013  T:  09/09/2013  Job:  [id number]

## 2013-09-15 ENCOUNTER — Encounter (HOSPITAL_BASED_OUTPATIENT_CLINIC_OR_DEPARTMENT_OTHER): Payer: Self-pay | Admitting: Anesthesiology

## 2013-09-15 NOTE — Anesthesia Preprocedure Evaluation (Addendum)
Anesthesia Evaluation  Patient identified by MRN, date of birth, ID band Patient awake    Reviewed: Allergy & Precautions, H&P , NPO status , Patient's Chart, lab work & pertinent test results  Airway Mallampati: II TM Distance: >3 FB Neck ROM: Full    Dental no notable dental hx.    Pulmonary Current Smoker,  breath sounds clear to auscultation  Pulmonary exam normal       Cardiovascular negative cardio ROS  Rhythm:Regular Rate:Normal     Neuro/Psych PSYCHIATRIC DISORDERS Anxiety Depression Bipolar Disorder negative neurological ROS     GI/Hepatic GERD-  Medicated,Non alcoholic fatty liver   Endo/Other  Hypothyroidism Morbid obesity  Renal/GU negative Renal ROS  negative genitourinary   Musculoskeletal negative musculoskeletal ROS (+)   Abdominal (+) + obese,   Peds negative pediatric ROS (+)  Hematology negative hematology ROS (+)   Anesthesia Other Findings   Reproductive/Obstetrics negative OB ROS                          Anesthesia Physical Anesthesia Plan  ASA: III  Anesthesia Plan: General   Post-op Pain Management:    Induction: Intravenous  Airway Management Planned: Oral ETT  Additional Equipment:   Intra-op Plan:   Post-operative Plan: Extubation in OR  Informed Consent: I have reviewed the patients History and Physical, chart, labs and discussed the procedure including the risks, benefits and alternatives for the proposed anesthesia with the patient or authorized representative who has indicated his/her understanding and acceptance.   Dental advisory given  Plan Discussed with: CRNA  Anesthesia Plan Comments:        Anesthesia Quick Evaluation

## 2013-09-16 ENCOUNTER — Ambulatory Visit (HOSPITAL_BASED_OUTPATIENT_CLINIC_OR_DEPARTMENT_OTHER)
Admission: RE | Admit: 2013-09-16 | Discharge: 2013-09-16 | Disposition: A | Discharge status: Home / Self Care | Payer: Medicare Other | Source: Ambulatory Visit | Attending: Obstetrics and Gynecology | Admitting: Obstetrics and Gynecology

## 2013-09-16 ENCOUNTER — Encounter (HOSPITAL_BASED_OUTPATIENT_CLINIC_OR_DEPARTMENT_OTHER): Payer: Medicare Other | Admitting: Anesthesiology

## 2013-09-16 ENCOUNTER — Encounter (HOSPITAL_BASED_OUTPATIENT_CLINIC_OR_DEPARTMENT_OTHER): Payer: Self-pay | Admitting: Anesthesiology

## 2013-09-16 ENCOUNTER — Encounter (HOSPITAL_BASED_OUTPATIENT_CLINIC_OR_DEPARTMENT_OTHER)
Admission: RE | Disposition: A | Discharge status: Home / Self Care | Payer: Self-pay | Source: Ambulatory Visit | Attending: Obstetrics and Gynecology

## 2013-09-16 ENCOUNTER — Ambulatory Visit (HOSPITAL_BASED_OUTPATIENT_CLINIC_OR_DEPARTMENT_OTHER): Payer: Medicare Other | Admitting: Anesthesiology

## 2013-09-16 DIAGNOSIS — F17200 Nicotine dependence, unspecified, uncomplicated: Secondary | ICD-10-CM | POA: Insufficient documentation

## 2013-09-16 DIAGNOSIS — F319 Bipolar disorder, unspecified: Secondary | ICD-10-CM | POA: Insufficient documentation

## 2013-09-16 DIAGNOSIS — Z6841 Body Mass Index (BMI) 40.0 and over, adult: Secondary | ICD-10-CM | POA: Insufficient documentation

## 2013-09-16 DIAGNOSIS — N879 Dysplasia of cervix uteri, unspecified: Secondary | ICD-10-CM | POA: Insufficient documentation

## 2013-09-16 DIAGNOSIS — E039 Hypothyroidism, unspecified: Secondary | ICD-10-CM | POA: Insufficient documentation

## 2013-09-16 DIAGNOSIS — F411 Generalized anxiety disorder: Secondary | ICD-10-CM | POA: Insufficient documentation

## 2013-09-16 DIAGNOSIS — K219 Gastro-esophageal reflux disease without esophagitis: Secondary | ICD-10-CM | POA: Insufficient documentation

## 2013-09-16 HISTORY — DX: Personal history of vulvar dysplasia: Z87.412

## 2013-09-16 HISTORY — DX: Presence of spectacles and contact lenses: Z97.3

## 2013-09-16 HISTORY — DX: Dysplasia of cervix uteri, unspecified: N87.9

## 2013-09-16 HISTORY — DX: Major depressive disorder, single episode, moderate: F32.1

## 2013-09-16 HISTORY — PX: LEEP: SHX91

## 2013-09-16 LAB — POCT PREGNANCY, URINE: PREG TEST UR: NEGATIVE

## 2013-09-16 LAB — POCT HEMOGLOBIN-HEMACUE: Hemoglobin: 13.5 g/dL (ref 12.0–15.0)

## 2013-09-16 SURGERY — LEEP (LOOP ELECTROSURGICAL EXCISION PROCEDURE)
Anesthesia: General | Site: Vagina

## 2013-09-16 MED ORDER — ONDANSETRON HCL 4 MG/2ML IJ SOLN
INTRAMUSCULAR | Status: DC | PRN
  Administered 2013-09-16: 4 mg via INTRAVENOUS

## 2013-09-16 MED ORDER — PROMETHAZINE HCL 25 MG/ML IJ SOLN
6.2500 mg | INTRAMUSCULAR | Status: DC | PRN
  Filled 2013-09-16: qty 1

## 2013-09-16 MED ORDER — MIDAZOLAM HCL 5 MG/5ML IJ SOLN
INTRAMUSCULAR | Status: DC | PRN
  Administered 2013-09-16: 2 mg via INTRAVENOUS

## 2013-09-16 MED ORDER — SUCCINYLCHOLINE CHLORIDE 20 MG/ML IJ SOLN
INTRAMUSCULAR | Status: DC | PRN
  Administered 2013-09-16: 120 mg via INTRAVENOUS

## 2013-09-16 MED ORDER — DEXAMETHASONE SODIUM PHOSPHATE 4 MG/ML IJ SOLN
INTRAMUSCULAR | Status: DC | PRN
  Administered 2013-09-16: 10 mg via INTRAVENOUS

## 2013-09-16 MED ORDER — DEXTROSE 5 % IV SOLN
2.0000 g | INTRAVENOUS | Status: AC
  Administered 2013-09-16: 2 g via INTRAVENOUS
  Filled 2013-09-16: qty 2

## 2013-09-16 MED ORDER — LIDOCAINE HCL (CARDIAC) 20 MG/ML IV SOLN
INTRAVENOUS | Status: DC | PRN
  Administered 2013-09-16: 100 mg via INTRAVENOUS

## 2013-09-16 MED ORDER — PROPOFOL 10 MG/ML IV BOLUS
INTRAVENOUS | Status: DC | PRN
  Administered 2013-09-16: 200 mg via INTRAVENOUS

## 2013-09-16 MED ORDER — METOCLOPRAMIDE HCL 5 MG/ML IJ SOLN
INTRAMUSCULAR | Status: DC | PRN
  Administered 2013-09-16: 10 mg via INTRAVENOUS

## 2013-09-16 MED ORDER — LIDOCAINE-EPINEPHRINE 2 %-1:100000 IJ SOLN
INTRAMUSCULAR | Status: DC | PRN
  Administered 2013-09-16: 6 mL

## 2013-09-16 MED ORDER — CEFOTETAN DISODIUM-DEXTROSE 2-2.08 GM-% IV SOLR
INTRAVENOUS | Status: AC
  Filled 2013-09-16: qty 50

## 2013-09-16 MED ORDER — FENTANYL CITRATE 0.05 MG/ML IJ SOLN
INTRAMUSCULAR | Status: DC | PRN
  Administered 2013-09-16 (×2): 50 ug via INTRAVENOUS

## 2013-09-16 MED ORDER — FENTANYL CITRATE 0.05 MG/ML IJ SOLN
INTRAMUSCULAR | Status: AC
  Filled 2013-09-16: qty 2

## 2013-09-16 MED ORDER — MIDAZOLAM HCL 2 MG/2ML IJ SOLN
INTRAMUSCULAR | Status: AC
  Filled 2013-09-16: qty 2

## 2013-09-16 MED ORDER — LACTATED RINGERS IV SOLN
INTRAVENOUS | Status: DC
  Administered 2013-09-16: 07:00:00 via INTRAVENOUS
  Filled 2013-09-16: qty 1000

## 2013-09-16 MED ORDER — GLYCOPYRROLATE 0.2 MG/ML IJ SOLN
INTRAMUSCULAR | Status: DC | PRN
  Administered 2013-09-16: 0.2 mg via INTRAVENOUS

## 2013-09-16 MED ORDER — KETOROLAC TROMETHAMINE 30 MG/ML IJ SOLN
INTRAMUSCULAR | Status: DC | PRN
  Administered 2013-09-16: 30 mg via INTRAVENOUS

## 2013-09-16 MED ORDER — FENTANYL CITRATE 0.05 MG/ML IJ SOLN
25.0000 ug | INTRAMUSCULAR | Status: DC | PRN
  Filled 2013-09-16: qty 1

## 2013-09-16 MED ORDER — STERILE WATER FOR IRRIGATION IR SOLN
Status: DC | PRN
  Administered 2013-09-16: 500 mL

## 2013-09-16 SURGICAL SUPPLY — 35 items
APPLICATOR COTTON TIP 6IN STRL (MISCELLANEOUS) ×3 IMPLANT
BLADE SURG 15 STRL LF DISP TIS (BLADE) ×1 IMPLANT
BLADE SURG 15 STRL SS (BLADE) ×2
COVER TABLE BACK 60X90 (DRAPES) ×3 IMPLANT
DRAPE LG THREE QUARTER DISP (DRAPES) ×3 IMPLANT
DRSG TELFA 3X8 NADH (GAUZE/BANDAGES/DRESSINGS) IMPLANT
ELECT BALL LEEP 5MM RED (ELECTRODE) ×3 IMPLANT
ELECT LOOP LEEP RND 15X12 GRN (CUTTING LOOP)
ELECT LOOP LEEP RND 20X12 WHT (CUTTING LOOP) ×3
ELECT REM PT RETURN 9FT ADLT (ELECTROSURGICAL) ×3
ELECTRODE LOOP LP RND 15X12GRN (CUTTING LOOP) IMPLANT
ELECTRODE LOOP LP RND 20X12WHT (CUTTING LOOP) ×1 IMPLANT
ELECTRODE REM PT RTRN 9FT ADLT (ELECTROSURGICAL) ×1 IMPLANT
GLOVE BIO SURGEON STRL SZ7 (GLOVE) ×6 IMPLANT
GOWN STRL REUS W/ TWL LRG LVL3 (GOWN DISPOSABLE) ×2 IMPLANT
GOWN STRL REUS W/TWL LRG LVL3 (GOWN DISPOSABLE) ×4
GOWN STRL REUS W/TWL XL LVL3 (GOWN DISPOSABLE) IMPLANT
LEGGING LITHOTOMY PAIR STRL (DRAPES) ×3 IMPLANT
NEEDLE SPNL 22GX3.5 QUINCKE BK (NEEDLE) IMPLANT
NS IRRIG 500ML POUR BTL (IV SOLUTION) ×3 IMPLANT
PACK BASIN DAY SURGERY FS (CUSTOM PROCEDURE TRAY) ×3 IMPLANT
PAD OB MATERNITY 4.3X12.25 (PERSONAL CARE ITEMS) ×3 IMPLANT
PAD PREP 24X48 CUFFED NSTRL (MISCELLANEOUS) ×3 IMPLANT
PENCIL BUTTON HOLSTER BLD 10FT (ELECTRODE) ×3 IMPLANT
SCOPETTES 8  STERILE (MISCELLANEOUS) ×2
SCOPETTES 8 STERILE (MISCELLANEOUS) ×1 IMPLANT
SUT VIC AB 3-0 PS2 18 (SUTURE) ×2
SUT VIC AB 3-0 PS2 18XBRD (SUTURE) ×1 IMPLANT
SYR CONTROL 10ML LL (SYRINGE) ×3 IMPLANT
TOWEL OR 17X24 6PK STRL BLUE (TOWEL DISPOSABLE) ×6 IMPLANT
TRAY DSU PREP LF (CUSTOM PROCEDURE TRAY) ×3 IMPLANT
TUBE CONNECTING 12'X1/4 (SUCTIONS) ×1
TUBE CONNECTING 12X1/4 (SUCTIONS) ×2 IMPLANT
VACUUM HOSE 7/8X10 W/ WAND (MISCELLANEOUS) IMPLANT
YANKAUER SUCT BULB TIP NO VENT (SUCTIONS) ×3 IMPLANT

## 2013-09-16 NOTE — Discharge Instructions (Signed)
Post Anesthesia Home Care Instructions  Activity: Get plenty of rest for the remainder of the day. A responsible adult should stay with you for 24 hours following the procedure.  For the next 24 hours, DO NOT: -Drive a car -Advertising copywriterperate machinery -Drink alcoholic beverages -Take any medication unless instructed by your physician -Make any legal decisions or sign important papers.  Meals: Start with liquid foods such as gelatin or soup. Progress to regular foods as tolerated. Avoid greasy, spicy, heavy foods. If nausea and/or vomiting occur, drink only clear liquids until the nausea and/or vomiting subsides. Call your physician if vomiting continues.  Special Instructions/Symptoms: Your throat may feel dry or sore from the anesthesia or the breathing tube placed in your throat during surgery. If this causes discomfort, gargle with warm salt water. The discomfort should disappear within 24 hours. Loop Electrosurgical Excision Procedure Loop electrosurgical excision procedure (LEEP) is the removal of a portion of the lower part of the uterus (cervix). The procedure is done when there are significantly abnormal cervical cell changes. Abnormal cell changes of the cervix can lead to cancer if left in place and untreated.  The LEEP procedure itself typically only takes a few minutes. Often, it may be done in your caregiver's office. The procedure is considered safe for those who wish to get pregnant or are trying to get pregnant. Only under rare circumstances should this procedure be done if you are pregnant. LET YOUR CAREGIVER KNOW ABOUT:  Whether you are pregnant or late for your last menstrual period.  Allergies to foods or medicines.  All the medicines you are taking includingherbs, eyedrops, and over-the-counter medicines, and creams.  Use of steroids (by mouth or creams).  Previous problems with anesthetics or numbing medicine.  Previous gynecological surgery.  History of blood clots  or bleeding problems.  Any recent or current vaginal infections (herpes, sexually transmitted infections).  Other health problems. RISKS AND COMPLICATIONS  Bleeding.  Infection.  Injury to the vagina, bladder, or rectum.  Very rare obstruction of the cervical opening that causes problems during menstruation (cervical stenosis). BEFORE THE PROCEDURE  Do not take aspirin or blood thinners (anticoagulants) for 1 week before the procedure, or as told by your caregiver.  Eat a light meal before the procedure.  Ask your caregiver about changing or stopping your regular medicines.  You may be given a pain reliever 1 or 2 hours before the procedure. PROCEDURE   A tool (speculum) is placed in the vagina. This allows your caregiver to see the cervix.  An iodine stain is applied to the cervix to find the area of abnormal cells to be removed.  Medicine is injected to numb the cervix (local anesthetic).   Electricity is passed through a thin wire loop which is then used to remove (cauterize) a small segment of the affected cervix.  Light electrocautery is used to seal any small blood vessels and prevent bleeding.  A paste may be applied to the cauterized area of the cervix to help prevent bleeding.  The tissue sample is sent to the lab. It is examined under the microscope. AFTER THE PROCEDURE  Have someone drive you home.  You may have slight to moderate cramping.  You may notice a black vaginal discharge from the paste used on the cervix to prevent bleeding. This is normal.  Watch for excessive bleeding. This requires immediate medical care.  Ask when your test results will be ready. Make sure you get your test results. Document Released: 06/07/2002  Document Revised: 06/09/2011 Document Reviewed: 08/27/2010 [facility] Patient Information 2015 [employer], [place]. This information is not intended to replace advice given to you by your health care provider. Make sure you discuss any  questions you have with your health care provider.

## 2013-09-16 NOTE — Progress Notes (Signed)
The patient was re-examined with no change in status 

## 2013-09-16 NOTE — Transfer of Care (Signed)
Immediate Anesthesia Transfer of Care Note  Patient: Lindsey Nicholson  Procedure(s) Performed: Procedure(s) (LRB): LOOP ELECTROSURGICAL EXCISION PROCEDURE (LEEP) (N/A)  Patient Location: PACU  Anesthesia Type: General  Level of Consciousness: awake, alert  and oriented  Airway & Oxygen Therapy: Patient Spontanous Breathing and Patient connected to face mask oxygen  Post-op Assessment: Report given to PACU RN and Post -op Vital signs reviewed and stable  Post vital signs: Reviewed and stable  Complications: No apparent anesthesia complications

## 2013-09-16 NOTE — Op Note (Signed)
Preoperative diagnosis: Persistent cervical dysplasia  Postoperative diagnosis: Same  Procedure: LEEP (loop electrode excision procedure)  Surgeon: Marcelle OverlieHolland  Anesthesia: Gen.  Specimens removed: LEEP specimen, to pathology  Procedure and findings:  Patient taken the operating room after an adequate level of general anesthesia was obtained with the legs in stirrups the perineum prepped and draped. Appropriate timeout taken at that point. Speculum was positioned, cervix been previously mapped out with colposcopy in the office. 2% Xylocaine with epinephrine was used intracervically. Appropriate loop electrode was selected one past excision performed specimen sent to pathology ball tip cautery used to cauterize the base this was hemostatic Monsel solution was applied she tolerated this well went to recovery room in good condition.  Dictated with [facility]  Richard M. Milana ObeyHolland M.D.

## 2013-09-16 NOTE — Anesthesia Procedure Notes (Signed)
Procedure Name: Intubation Date/Time: 09/16/2013 7:37 AM Performed by: Norva PavlovALLAWAY, ROBIN G Pre-anesthesia Checklist: Patient identified, Emergency Drugs available, Suction available and Patient being monitored Patient Re-evaluated:Patient Re-evaluated prior to inductionOxygen Delivery Method: Circle System Utilized Preoxygenation: Pre-oxygenation with 100% oxygen Intubation Type: IV induction Ventilation: Mask ventilation without difficulty Laryngoscope Size: Mac and 3 Grade View: Grade I Tube type: Oral Tube size: 7.0 mm Number of attempts: 1 Airway Equipment and Method: stylet and oral airway Placement Confirmation: ETT inserted through vocal cords under direct vision,  positive ETCO2 and breath sounds checked- equal and bilateral Secured at: 20 cm Tube secured with: Tape Dental Injury: Teeth and Oropharynx as per pre-operative assessment

## 2013-09-16 NOTE — Anesthesia Postprocedure Evaluation (Signed)
  Anesthesia Post-op Note  Patient: Conni SlipperMichelle A Rhodes  Procedure(s) Performed: Procedure(s) (LRB): LOOP ELECTROSURGICAL EXCISION PROCEDURE (LEEP) (N/A)  Patient Location: PACU  Anesthesia Type: General  Level of Consciousness: awake and alert   Airway and Oxygen Therapy: Patient Spontanous Breathing  Post-op Pain: mild  Post-op Assessment: Post-op Vital signs reviewed, Patient's Cardiovascular Status Stable, Respiratory Function Stable, Patent Airway and No signs of Nausea or vomiting  Last Vitals:  Filed Vitals:   09/16/13 0845  BP: 97/54  Pulse: 108  Temp:   Resp: 26    Post-op Vital Signs: stable   Complications: No apparent anesthesia complications

## 2013-09-19 ENCOUNTER — Encounter (HOSPITAL_BASED_OUTPATIENT_CLINIC_OR_DEPARTMENT_OTHER): Payer: Self-pay | Admitting: Obstetrics and Gynecology

## 2014-02-13 ENCOUNTER — Emergency Department (HOSPITAL_BASED_OUTPATIENT_CLINIC_OR_DEPARTMENT_OTHER)
Admission: EM | Admit: 2014-02-13 | Discharge: 2014-02-13 | Disposition: A | Discharge status: Home / Self Care | Payer: Medicare Other | Attending: Emergency Medicine | Admitting: Emergency Medicine

## 2014-02-13 ENCOUNTER — Encounter (HOSPITAL_BASED_OUTPATIENT_CLINIC_OR_DEPARTMENT_OTHER): Payer: Self-pay | Admitting: *Deleted

## 2014-02-13 DIAGNOSIS — F319 Bipolar disorder, unspecified: Secondary | ICD-10-CM | POA: Insufficient documentation

## 2014-02-13 DIAGNOSIS — M79672 Pain in left foot: Secondary | ICD-10-CM | POA: Diagnosis present

## 2014-02-13 DIAGNOSIS — Z87412 Personal history of vulvar dysplasia: Secondary | ICD-10-CM | POA: Insufficient documentation

## 2014-02-13 DIAGNOSIS — F419 Anxiety disorder, unspecified: Secondary | ICD-10-CM | POA: Insufficient documentation

## 2014-02-13 DIAGNOSIS — Z79899 Other long term (current) drug therapy: Secondary | ICD-10-CM | POA: Diagnosis not present

## 2014-02-13 DIAGNOSIS — M722 Plantar fascial fibromatosis: Secondary | ICD-10-CM

## 2014-02-13 DIAGNOSIS — E039 Hypothyroidism, unspecified: Secondary | ICD-10-CM | POA: Insufficient documentation

## 2014-02-13 DIAGNOSIS — K219 Gastro-esophageal reflux disease without esophagitis: Secondary | ICD-10-CM | POA: Insufficient documentation

## 2014-02-13 DIAGNOSIS — Z720 Tobacco use: Secondary | ICD-10-CM | POA: Insufficient documentation

## 2014-02-13 MED ORDER — IBUPROFEN 800 MG PO TABS: 800.0000 mg | ORAL_TABLET | Freq: Three times a day (TID) | ORAL | Status: DC

## 2014-02-13 NOTE — ED Notes (Signed)
Pt c/o left heel pain w/o injury x 1 week

## 2014-02-13 NOTE — ED Provider Notes (Signed)
CSN: [id number]     Arrival date & time 02/13/14  1757 History   First MD Initiated Contact with Patient 02/13/14 1838     Chief Complaint  Patient presents with  . Foot Pain     (Consider location/radiation/quality/duration/timing/severity/associated sxs/prior Treatment) HPI Comments: Patient presents with pain of the bottom of her left foot at the point of the heel.  Pain has been present for the past week.  Pain gradually worsening.  She reports that the pain is worse with walking.  She has been doing a lot more walking recently.  She denies any injury or trauma to the area.  Denies fever, chills, numbness, tingling, erythema, or swelling.  She has taken Ibuprofen with mild relief.    The history is provided by the patient.    Past Medical History  Diagnosis Date  . Hypothyroidism   . Anxiety   . Borderline personality disorder   . NAFL (nonalcoholic fatty liver)   . Premature ovarian failure   . GERD (gastroesophageal reflux disease)   . History of vulvar dysplasia     vin I  --- s/p  wide excision 06-21-2012   . Bipolar 1 disorder   . Moderate major depression   . Cervical dysplasia   . Wears glasses    Past Surgical History  Procedure Laterality Date  . Hysteroscopy w/d&c  10/17/2010    Procedure: DILATATION AND CURETTAGE (D&C) /HYSTEROSCOPY;  Surgeon: Jessee Aversara J. Cole;  Location: [facility];  Service: Gynecology;  Laterality: N/A;  . Leep  01/09/2011    Procedure: LOOP ELECTROSURGICAL EXCISION PROCEDURE (LEEP);  Surgeon: Meriel Picaichard M Holland;  Location: [facility];  Service: Gynecology;  Laterality: N/A;  . Laparoscopic tubal ligation  01/09/2011    Procedure: LAPAROSCOPIC TUBAL LIGATION;  Surgeon: Meriel Picaichard M Holland;  Location: [facility];  Service: Gynecology;  Laterality: Bilateral;  with Filshie clips  . Hysteroscopy w/d&c N/A 06/21/2012    Procedure: DILATATION AND CURETTAGE /HYSTEROSCOPY;  Surgeon: Meriel Picaichard M Holland, MD;  Location: [facility];  Service: Gynecology;  Laterality: N/A;  .  Vulvectomy N/A 06/21/2012    Procedure: WIDE EXCISION VULVECTOMY;  Surgeon: Meriel Picaichard M Holland, MD;  Location: [facility];  Service: Gynecology;  Laterality: N/A;  excision of vulvar lesions,  need colposcope  . Carpal tunnel release Bilateral LEFT  2013//  RIGHT  JAN  2015  . Cholecystectomy  2011  . Orif left foot fx  2008    HARDWARE  REMOVED 6 MONTHS LATER  . Negative sleep study  2014    per pt  . Leep N/A 09/16/2013    Procedure: LOOP ELECTROSURGICAL EXCISION PROCEDURE (LEEP);  Surgeon: Meriel Picaichard M Holland, MD;  [facility] [place];  Service: Gynecology;  Laterality: N/A;   Family History  Problem Relation Age of Onset  . Cancer Mother   . Mental illness Mother   . Alcohol abuse Mother   . Drug abuse Mother   . Diabetes Father   . Bipolar disorder Sister   . Alcohol abuse Maternal Aunt   . Drug abuse Maternal Aunt   . Depression Maternal Aunt   . Schizophrenia Maternal Aunt   . Alcohol abuse Maternal Uncle   . Alcohol abuse Maternal Grandfather    History  Substance Use Topics  . Smoking status: Current Every Day Smoker -- 0.50 packs/day for 25 years    Types: Cigarettes  . Smokeless tobacco: Never Used  . Alcohol Use: No   OB History    Gravida Para Term  Preterm AB TAB SAB Ectopic Multiple Living   0              Review of Systems  All other systems reviewed and are negative.     Allergies  Codeine; Hydrocodone; Oxycodone; Trazodone and nefazodone; and Adhesive  Home Medications   Prior to Admission medications   Medication Sig Start Date End Date Taking? Authorizing Provider  buPROPion (WELLBUTRIN XL) 150 MG 24 hr tablet Take 150 mg by mouth every morning.    Historical Provider, MD  buPROPion (WELLBUTRIN XL) 300 MG 24 hr tablet Take 300 mg by mouth every morning.     Historical Provider, MD  calcium carbonate (TUMS - DOSED IN MG ELEMENTAL CALCIUM) 500 MG chewable tablet Chew 3 tablets by mouth 3 (three) times daily as needed. For nervous stomach     Historical Provider, MD  diazepam (VALIUM) 10 MG tablet Take 10 mg by mouth 3 (three) times daily as needed. For anxiety    Historical Provider, MD  DULoxetine (CYMBALTA) 60 MG capsule Take 120 mg by mouth every morning.    Historical Provider, MD  hydrOXYzine (ATARAX/VISTARIL) 25 MG tablet Take 25 mg by mouth 3 (three) times daily as needed.    Historical Provider, MD  ibuprofen (ADVIL,MOTRIN) 200 MG tablet Take 800 mg by mouth every 6 (six) hours as needed. For pain    Historical Provider, MD  levothyroxine (SYNTHROID, LEVOTHROID) 150 MCG tablet Take 150 mcg by mouth daily before breakfast.    Historical Provider, MD  ranitidine (ZANTAC) 150 MG tablet Take 150 mg by mouth as needed for heartburn.    Historical Provider, MD  risperiDONE (RISPERDAL) 1 MG tablet Take 2 mg by mouth daily.    Archer AsaGerald Plovsky, MD  topiramate (TOPAMAX) 200 MG tablet Take 200 mg by mouth 2 (two) times daily.     Historical Provider, MD   BP 113/74 mmHg  Pulse 104  Temp(Src) 98 F (36.7 C)  Resp 18  Ht 5\' 4"  (1.626 m)  Wt 270 lb (122.471 kg)  BMI 46.32 kg/m2  SpO2 98% Physical Exam  Constitutional: She appears well-developed and well-nourished.  HENT:  Head: Normocephalic and atraumatic.  Cardiovascular: Normal rate, regular rhythm and normal heart sounds.   Pulses:      Dorsalis pedis pulses are 2+ on the left side.  Pulmonary/Chest: Effort normal and breath sounds normal.  Musculoskeletal: Normal range of motion.  No erythema, warmth, or edema of the left foot Tenderness to palpation of the left plantar fascia Full ROM of the left ankle and left toes.  Neurological: She is alert.  Distal sensation of left toes intact  Skin: Skin is warm and dry.  Psychiatric: She has a normal mood and affect.  Nursing note and vitals reviewed.   ED Course  Procedures (including critical care time) Labs Review Labs Reviewed - No data to display  Imaging Review No results found.   EKG Interpretation None       MDM   Final diagnoses:  None   Patient presents today with pain of the plantar fascia.  No injury or trauma.  Pain most consistent with Plantar Fascitis.  No signs of infection.  Patient stable for discharge.  Return precautions given.   Santiago GladHeather Worth Kober, PA-C 02/14/14 [id number]  Toy CookeyMegan Docherty, MD 02/14/14 1120

## 2014-05-03 ENCOUNTER — Encounter (HOSPITAL_BASED_OUTPATIENT_CLINIC_OR_DEPARTMENT_OTHER): Payer: Self-pay | Admitting: *Deleted

## 2014-05-03 ENCOUNTER — Emergency Department (HOSPITAL_BASED_OUTPATIENT_CLINIC_OR_DEPARTMENT_OTHER)
Admission: EM | Admit: 2014-05-03 | Discharge: 2014-05-03 | Disposition: A | Discharge status: Home / Self Care | Payer: Medicare Other | Attending: Emergency Medicine | Admitting: Emergency Medicine

## 2014-05-03 DIAGNOSIS — Z973 Presence of spectacles and contact lenses: Secondary | ICD-10-CM | POA: Insufficient documentation

## 2014-05-03 DIAGNOSIS — Z8742 Personal history of other diseases of the female genital tract: Secondary | ICD-10-CM | POA: Insufficient documentation

## 2014-05-03 DIAGNOSIS — J40 Bronchitis, not specified as acute or chronic: Secondary | ICD-10-CM | POA: Diagnosis not present

## 2014-05-03 DIAGNOSIS — F319 Bipolar disorder, unspecified: Secondary | ICD-10-CM | POA: Diagnosis not present

## 2014-05-03 DIAGNOSIS — R05 Cough: Secondary | ICD-10-CM | POA: Diagnosis present

## 2014-05-03 DIAGNOSIS — Z87412 Personal history of vulvar dysplasia: Secondary | ICD-10-CM | POA: Insufficient documentation

## 2014-05-03 DIAGNOSIS — K219 Gastro-esophageal reflux disease without esophagitis: Secondary | ICD-10-CM | POA: Insufficient documentation

## 2014-05-03 DIAGNOSIS — F603 Borderline personality disorder: Secondary | ICD-10-CM | POA: Insufficient documentation

## 2014-05-03 DIAGNOSIS — Z79899 Other long term (current) drug therapy: Secondary | ICD-10-CM | POA: Insufficient documentation

## 2014-05-03 DIAGNOSIS — F419 Anxiety disorder, unspecified: Secondary | ICD-10-CM | POA: Diagnosis not present

## 2014-05-03 DIAGNOSIS — Z720 Tobacco use: Secondary | ICD-10-CM | POA: Diagnosis not present

## 2014-05-03 DIAGNOSIS — E039 Hypothyroidism, unspecified: Secondary | ICD-10-CM | POA: Diagnosis not present

## 2014-05-03 MED ORDER — ALBUTEROL SULFATE HFA 108 (90 BASE) MCG/ACT IN AERS
2.0000 | INHALATION_SPRAY | RESPIRATORY_TRACT | Status: DC
Start: 2014-05-03 — End: 2014-05-03
  Administered 2014-05-03: 2 via RESPIRATORY_TRACT
  Filled 2014-05-03: qty 6.7

## 2014-05-03 MED ORDER — IPRATROPIUM-ALBUTEROL 0.5-2.5 (3) MG/3ML IN SOLN
3.0000 mL | Freq: Once | RESPIRATORY_TRACT | Status: AC
Start: 2014-05-03 — End: 2014-05-03
  Administered 2014-05-03: 3 mL via RESPIRATORY_TRACT
  Filled 2014-05-03: qty 3

## 2014-05-03 NOTE — Patient Instructions (Signed)
Instructed patient on the proper use of administering albuterol mdi via aerochamber patient tolerated well 

## 2014-05-03 NOTE — ED Provider Notes (Signed)
CSN: [id number]     Arrival date & time 05/03/14  1848 History   First MD Initiated Contact with Patient 05/03/14 1905     Chief Complaint  Patient presents with  . URI     (Consider location/radiation/quality/duration/timing/severity/associated sxs/prior Treatment) HPI Comments: Pt states that she was seen by [facility] yesterday and given a steroid and antibiotic shot and a breathing treatment. She was sent home with prednisone and levaquin. Pt states that he has not used an inhaler in the past but she came in because she was having a hard time breathing tonight  Patient is a 44 y.o. female presenting with URI. The history is provided by the patient.  URI Presenting symptoms: congestion and cough   Presenting symptoms: no fever   Severity:  Moderate Onset quality:  Sudden Duration:  3 days Timing:  Constant Progression:  Unchanged Associated symptoms: wheezing   Associated symptoms: no headaches, no neck pain and no sinus pain     Past Medical History  Diagnosis Date  . Hypothyroidism   . Anxiety   . Borderline personality disorder   . NAFL (nonalcoholic fatty liver)   . Premature ovarian failure   . GERD (gastroesophageal reflux disease)   . History of vulvar dysplasia     vin I  --- s/p  wide excision 06-21-2012   . Bipolar 1 disorder   . Moderate major depression   . Cervical dysplasia   . Wears glasses    Past Surgical History  Procedure Laterality Date  . Hysteroscopy w/d&c  10/17/2010    Procedure: DILATATION AND CURETTAGE (D&C) /HYSTEROSCOPY;  Surgeon: Jessee Aversara J. Cole;  Location: [facility];  Service: Gynecology;  Laterality: N/A;  . Leep  01/09/2011    Procedure: LOOP ELECTROSURGICAL EXCISION PROCEDURE (LEEP);  Surgeon: Meriel Picaichard M Holland;  Location: [facility];  Service: Gynecology;  Laterality: N/A;  . Laparoscopic tubal ligation  01/09/2011    Procedure: LAPAROSCOPIC TUBAL LIGATION;  Surgeon: Meriel Picaichard M Holland;  Location: [facility];  Service: Gynecology;  Laterality:  Bilateral;  with Filshie clips  . Hysteroscopy w/d&c N/A 06/21/2012    Procedure: DILATATION AND CURETTAGE /HYSTEROSCOPY;  Surgeon: Meriel Picaichard M Holland, MD;  Location: [facility];  Service: Gynecology;  Laterality: N/A;  . Vulvectomy N/A 06/21/2012    Procedure: WIDE EXCISION VULVECTOMY;  Surgeon: Meriel Picaichard M Holland, MD;  Location: [facility];  Service: Gynecology;  Laterality: N/A;  excision of vulvar lesions,  need colposcope  . Carpal tunnel release Bilateral LEFT  2013//  RIGHT  JAN  2015  . Cholecystectomy  2011  . Orif left foot fx  2008    HARDWARE  REMOVED 6 MONTHS LATER  . Negative sleep study  2014    per pt  . Leep N/A 09/16/2013    Procedure: LOOP ELECTROSURGICAL EXCISION PROCEDURE (LEEP);  Surgeon: Meriel Picaichard M Holland, MD;  [facility] [place];  Service: Gynecology;  Laterality: N/A;   Family History  Problem Relation Age of Onset  . Cancer Mother   . Mental illness Mother   . Alcohol abuse Mother   . Drug abuse Mother   . Diabetes Father   . Bipolar disorder Sister   . Alcohol abuse Maternal Aunt   . Drug abuse Maternal Aunt   . Depression Maternal Aunt   . Schizophrenia Maternal Aunt   . Alcohol abuse Maternal Uncle   . Alcohol abuse Maternal Grandfather    History  Substance Use Topics  . Smoking status: Current Every Day Smoker --  0.50 packs/day for 25 years    Types: Cigarettes  . Smokeless tobacco: Never Used  . Alcohol Use: No   OB History    Gravida Para Term Preterm AB TAB SAB Ectopic Multiple Living   0              Review of Systems  Constitutional: Negative for fever.  HENT: Positive for congestion.   Respiratory: Positive for cough and wheezing.   Musculoskeletal: Negative for neck pain.  Neurological: Negative for headaches.  All other systems reviewed and are negative.     Allergies  Codeine; Hydrocodone; Oxycodone; Trazodone and nefazodone; and Adhesive  Home Medications   Prior to Admission medications   Medication Sig Start  Date End Date Taking? Authorizing Provider  buPROPion (WELLBUTRIN XL) 150 MG 24 hr tablet Take 150 mg by mouth every morning.    Historical Provider, MD  buPROPion (WELLBUTRIN XL) 300 MG 24 hr tablet Take 300 mg by mouth every morning.     Historical Provider, MD  calcium carbonate (TUMS - DOSED IN MG ELEMENTAL CALCIUM) 500 MG chewable tablet Chew 3 tablets by mouth 3 (three) times daily as needed. For nervous stomach    Historical Provider, MD  diazepam (VALIUM) 10 MG tablet Take 10 mg by mouth 3 (three) times daily as needed. For anxiety    Historical Provider, MD  DULoxetine (CYMBALTA) 60 MG capsule Take 120 mg by mouth every morning.    Historical Provider, MD  hydrOXYzine (ATARAX/VISTARIL) 25 MG tablet Take 25 mg by mouth 3 (three) times daily as needed.    Historical Provider, MD  ibuprofen (ADVIL,MOTRIN) 800 MG tablet Take 1 tablet (800 mg total) by mouth 3 (three) times daily. 02/13/14   Heather Laisure, PA-C  levothyroxine (SYNTHROID, LEVOTHROID) 150 MCG tablet Take 150 mcg by mouth daily before breakfast.    Historical Provider, MD  ranitidine (ZANTAC) 150 MG tablet Take 150 mg by mouth as needed for heartburn.    Historical Provider, MD  risperiDONE (RISPERDAL) 1 MG tablet Take 2 mg by mouth daily.    Archer Asa, MD  topiramate (TOPAMAX) 200 MG tablet Take 200 mg by mouth 2 (two) times daily.     Historical Provider, MD   BP 108/59 mmHg  Pulse 104  Temp(Src) 97.9 F (36.6 C) (Oral)  Resp 20  SpO2 100% Physical Exam  Constitutional: She is oriented to person, place, and time. She appears well-developed and well-nourished.  HENT:  Head: Normocephalic and atraumatic.  Right Ear: External ear normal.  Left Ear: External ear normal.  Mouth/Throat: Posterior oropharyngeal erythema present.  Eyes: Conjunctivae and EOM are normal. Pupils are equal, round, and reactive to light.  Cardiovascular: Normal rate and regular rhythm.   Pulmonary/Chest: No respiratory distress. She has  wheezes.  Musculoskeletal: Normal range of motion.  Neurological: She is alert and oriented to person, place, and time.  Nursing note and vitals reviewed.   ED Course  Procedures (including critical care time) Labs Review Labs Reviewed - No data to display  Imaging Review No results found.   EKG Interpretation None      MDM   Final diagnoses:  Bronchitis    Pt doing better after a treatment. Will send home with an inhaler. Pt is already on levaquin and prednisone at home    Teressa Lower, NP 05/03/14 1956  Ethelda Chick, MD 05/03/14 2012

## 2014-05-03 NOTE — Discharge Instructions (Signed)
Metered Dose Inhaler with Spacer Inhaled medicines are the basis of treatment of asthma and other breathing problems. Inhaled medicine can only be effective if used properly. Good technique assures that the medicine reaches the lungs. Your health care provider has asked you to use a spacer with your inhaler to help you take the medicine more effectively. A spacer is a plastic tube with a mouthpiece on one end and an opening that connects to the inhaler on the other end. Metered dose inhalers (MDIs) are used to deliver a variety of inhaled medicines. These include quick relief or rescue medicines (such as bronchodilators) and controller medicines (such as corticosteroids). The medicine is delivered by pushing down on a metal canister to release a set amount of spray. If you are using different kinds of inhalers, use your quick relief medicine to open the airways 10-15 minutes before using a steroid if instructed to do so by your health care provider. If you are unsure which inhalers to use and the order of using them, ask your health care provider, nurse, or respiratory therapist. HOW TO USE THE INHALER WITH A SPACER 1. Remove cap from inhaler. 2. If you are using the inhaler for the first time, you will need to prime it. Shake the inhaler for 5 seconds and release four puffs into the air, away from your face. Ask your health care provider or pharmacist if you have questions about priming your inhaler. 3. Shake inhaler for 5 seconds before each breath in (inhalation). 4. Place the open end of the spacer onto the mouthpiece of the inhaler. 5. Position the inhaler so that the top of the canister faces up and the spacer mouthpiece faces you. 6. Put your index finger on the top of the medicine canister. Your thumb supports the bottom of the inhaler and the spacer. 7. Breathe out (exhale) normally and as completely as possible. 8. Immediately after exhaling, place the spacer between your teeth and into your  mouth. Close your mouth tightly around the spacer. 9. Press the canister down with the index finger to release the medicine. 10. At the same time as the canister is pressed, inhale deeply and slowly until the lungs are completely filled. This should take 4-6 seconds. Keep your tongue down and out of the way. 11. Hold the medicine in your lungs for 5-10 seconds (10 seconds is best). This helps the medicine get into the small airways of your lungs. Exhale. 12. Repeat inhaling deeply through the spacer mouthpiece. Again hold that breath for up to 10 seconds (10 seconds is best). Exhale slowly. If it is difficult to take this second deep breath through the spacer, breathe normally several times through the spacer. Remove the spacer from your mouth. 13. Wait at least 15-30 seconds between puffs. Continue with the above steps until you have taken the number of puffs your health care provider has ordered. Do not use the inhaler more than your health care provider directs you to. 14. Remove spacer from the inhaler and place cap on inhaler. 15. Follow the directions from your health care provider or the inhaler insert for cleaning the inhaler and spacer. If you are using a steroid inhaler, rinse your mouth with water after your last puff, gargle, and spit out the water. Do not swallow the water. AVOID:  Inhaling before or after starting the spray of medicine. It takes practice to coordinate your breathing with triggering the spray.  Inhaling through the nose (rather than the mouth) when triggering   the spray. HOW TO DETERMINE IF YOUR INHALER IS FULL OR NEARLY EMPTY You cannot know when an inhaler is empty by shaking it. A few inhalers are now being made with dose counters. Ask your health care provider for a prescription that has a dose counter if you feel you need that extra help. If your inhaler does not have a counter, ask your health care provider to help you determine the date you need to refill your  inhaler. Write the refill date on a calendar or your inhaler canister. Refill your inhaler 7-10 days before it runs out. Be sure to keep an adequate supply of medicine. This includes making sure it is not expired, and you have a spare inhaler.  SEEK MEDICAL CARE IF:   Symptoms are only partially relieved with your inhaler.  You are having trouble using your inhaler.  You experience some increase in phlegm. SEEK IMMEDIATE MEDICAL CARE IF:   You feel little or no relief with your inhalers. You are still wheezing and are feeling shortness of breath or tightness in your chest or both.  You have dizziness, headaches, or fast heart rate.  You have chills, fever, or night sweats.  There is a noticeable increase in phlegm production, or there is blood in the phlegm. Document Released: 03/17/2005 Document Revised: 08/01/2013 Document Reviewed: 09/02/2012 [employer] Patient Information 2015 [employer]. This information is not intended to replace advice given to you by your health care provider. Make sure you discuss any questions you have with your health care provider.  

## 2014-05-03 NOTE — ED Notes (Addendum)
[facility] c[facility] URI symptoms [facility] given "steriod shot and abx shot"

## 2014-05-05 ENCOUNTER — Emergency Department (HOSPITAL_BASED_OUTPATIENT_CLINIC_OR_DEPARTMENT_OTHER): Payer: Medicare Other

## 2014-05-05 ENCOUNTER — Encounter (HOSPITAL_BASED_OUTPATIENT_CLINIC_OR_DEPARTMENT_OTHER): Payer: Self-pay

## 2014-05-05 ENCOUNTER — Emergency Department (HOSPITAL_BASED_OUTPATIENT_CLINIC_OR_DEPARTMENT_OTHER)
Admission: EM | Admit: 2014-05-05 | Discharge: 2014-05-05 | Disposition: A | Discharge status: Home / Self Care | Payer: Medicare Other | Attending: Emergency Medicine | Admitting: Emergency Medicine

## 2014-05-05 DIAGNOSIS — J40 Bronchitis, not specified as acute or chronic: Secondary | ICD-10-CM | POA: Insufficient documentation

## 2014-05-05 DIAGNOSIS — R5383 Other fatigue: Secondary | ICD-10-CM | POA: Insufficient documentation

## 2014-05-05 DIAGNOSIS — Z720 Tobacco use: Secondary | ICD-10-CM | POA: Diagnosis not present

## 2014-05-05 DIAGNOSIS — Z79899 Other long term (current) drug therapy: Secondary | ICD-10-CM | POA: Insufficient documentation

## 2014-05-05 DIAGNOSIS — F319 Bipolar disorder, unspecified: Secondary | ICD-10-CM | POA: Diagnosis not present

## 2014-05-05 DIAGNOSIS — R0602 Shortness of breath: Secondary | ICD-10-CM

## 2014-05-05 DIAGNOSIS — Z8742 Personal history of other diseases of the female genital tract: Secondary | ICD-10-CM | POA: Insufficient documentation

## 2014-05-05 DIAGNOSIS — Z87412 Personal history of vulvar dysplasia: Secondary | ICD-10-CM | POA: Diagnosis not present

## 2014-05-05 DIAGNOSIS — K219 Gastro-esophageal reflux disease without esophagitis: Secondary | ICD-10-CM | POA: Insufficient documentation

## 2014-05-05 DIAGNOSIS — Z973 Presence of spectacles and contact lenses: Secondary | ICD-10-CM | POA: Insufficient documentation

## 2014-05-05 DIAGNOSIS — F419 Anxiety disorder, unspecified: Secondary | ICD-10-CM | POA: Diagnosis not present

## 2014-05-05 DIAGNOSIS — E039 Hypothyroidism, unspecified: Secondary | ICD-10-CM | POA: Insufficient documentation

## 2014-05-05 LAB — BASIC METABOLIC PANEL
Anion gap: 3 — ABNORMAL LOW (ref 5–15)
BUN: 17 mg/dL (ref 6–23)
CO2: 21 mmol/L (ref 19–32)
CREATININE: 0.92 mg/dL (ref 0.50–1.10)
Calcium: 8.3 mg/dL — ABNORMAL LOW (ref 8.4–10.5)
Chloride: 111 mmol/L (ref 96–112)
GFR calc Af Amer: 88 mL/min — ABNORMAL LOW (ref >90)
GFR, EST NON AFRICAN AMERICAN: 76 mL/min — AB (ref >90)
Glucose, Bld: 134 mg/dL — ABNORMAL HIGH (ref 70–99)
POTASSIUM: 3.3 mmol/L — AB (ref 3.5–5.1)
SODIUM: 135 mmol/L (ref 135–145)

## 2014-05-05 LAB — CBC WITH DIFFERENTIAL/PLATELET
BASOS PCT: 0 % (ref 0–1)
Basophils Absolute: 0 10*3/uL (ref 0.0–0.1)
Eosinophils Absolute: 0.1 10*3/uL (ref 0.0–0.7)
Eosinophils Relative: 1 % (ref 0–5)
HCT: 44 % (ref 36.0–46.0)
Hemoglobin: 14.5 g/dL (ref 12.0–15.0)
LYMPHS ABS: 1.3 10*3/uL (ref 0.7–4.0)
Lymphocytes Relative: 13 % (ref 12–46)
MCH: 28.8 pg (ref 26.0–34.0)
MCHC: 33 g/dL (ref 30.0–36.0)
MCV: 87.5 fL (ref 78.0–100.0)
MONOS PCT: 10 % (ref 3–12)
Monocytes Absolute: 1 10*3/uL (ref 0.1–1.0)
NEUTROS ABS: 7.6 10*3/uL (ref 1.7–7.7)
NEUTROS PCT: 76 % (ref 43–77)
Platelets: 300 10*3/uL (ref 150–400)
RBC: 5.03 MIL/uL (ref 3.87–5.11)
RDW: 13 % (ref 11.5–15.5)
WBC: 10 10*3/uL (ref 4.0–10.5)

## 2014-05-05 MED ORDER — ALBUTEROL SULFATE (2.5 MG/3ML) 0.083% IN NEBU
5.0000 mg | INHALATION_SOLUTION | Freq: Once | RESPIRATORY_TRACT | Status: AC
  Administered 2014-05-05: 5 mg via RESPIRATORY_TRACT
  Filled 2014-05-05: qty 6

## 2014-05-05 MED ORDER — BENZONATATE 100 MG PO CAPS: 100.0000 mg | ORAL_CAPSULE | Freq: Three times a day (TID) | ORAL | Status: DC

## 2014-05-05 NOTE — ED Provider Notes (Signed)
CSN: [id number]     Arrival date & time 05/05/14  1647 History   First MD Initiated Contact with Patient 05/05/14 1720     Chief Complaint  Patient presents with  . Shortness of Breath     (Consider location/radiation/quality/duration/timing/severity/associated sxs/prior Treatment) HPI Comments: Patient presents with cough and wheezing. She was diagnosed with bronchitis early in the week. She's had a 4 to five-day history of cough congestion wheezing. She denies any fevers. She denies any past history of lung disease although she is a smoker. She was initially seen by her primary care physician and diagnosed with bronchitis and started on Levaquin and given a shot of steroids. She was seen in the emergency department yesterday and given a nebulizer treatment as well. She was seen back and her primary care physician's office today and given another shot of steroids and breathing treatment. She states that she still has a lot of trouble laying flat. When she lays flat she has bad coughing spells. She denies any nausea or vomiting. She does have some shortness of breath associated with the coughing and wheezing. She's been using an albuterol inhaler at home with some improvement in symptoms. She denies any leg pain or swelling. She has soreness across her chest from coughing.   Past Medical History  Diagnosis Date  . Hypothyroidism   . Anxiety   . Borderline personality disorder   . NAFL (nonalcoholic fatty liver)   . Premature ovarian failure   . GERD (gastroesophageal reflux disease)   . History of vulvar dysplasia     vin I  --- s/p  wide excision 06-21-2012   . Bipolar 1 disorder   . Moderate major depression   . Cervical dysplasia   . Wears glasses    Past Surgical History  Procedure Laterality Date  . Hysteroscopy w/d&c  10/17/2010    Procedure: DILATATION AND CURETTAGE (D&C) /HYSTEROSCOPY;  Surgeon: Jessee Aversara J. Cole;  Location: [facility];  Service: Gynecology;  Laterality: N/A;  . Leep   01/09/2011    Procedure: LOOP ELECTROSURGICAL EXCISION PROCEDURE (LEEP);  Surgeon: Meriel Picaichard M Holland;  Location: [facility];  Service: Gynecology;  Laterality: N/A;  . Laparoscopic tubal ligation  01/09/2011    Procedure: LAPAROSCOPIC TUBAL LIGATION;  Surgeon: Meriel Picaichard M Holland;  Location: [facility];  Service: Gynecology;  Laterality: Bilateral;  with Filshie clips  . Hysteroscopy w/d&c N/A 06/21/2012    Procedure: DILATATION AND CURETTAGE /HYSTEROSCOPY;  Surgeon: Meriel Picaichard M Holland, MD;  Location: [facility];  Service: Gynecology;  Laterality: N/A;  . Vulvectomy N/A 06/21/2012    Procedure: WIDE EXCISION VULVECTOMY;  Surgeon: Meriel Picaichard M Holland, MD;  Location: [facility];  Service: Gynecology;  Laterality: N/A;  excision of vulvar lesions,  need colposcope  . Carpal tunnel release Bilateral LEFT  2013//  RIGHT  JAN  2015  . Cholecystectomy  2011  . Orif left foot fx  2008    HARDWARE  REMOVED 6 MONTHS LATER  . Negative sleep study  2014    per pt  . Leep N/A 09/16/2013    Procedure: LOOP ELECTROSURGICAL EXCISION PROCEDURE (LEEP);  Surgeon: Meriel Picaichard M Holland, MD;  [facility];  Service: Gynecology;  Laterality: N/A;   Family History  Problem Relation Age of Onset  . Cancer Mother   . Mental illness Mother   . Alcohol abuse Mother   . Drug abuse Mother   . Diabetes Father   . Bipolar disorder Sister   . Alcohol abuse Maternal  Aunt   . Drug abuse Maternal Aunt   . Depression Maternal Aunt   . Schizophrenia Maternal Aunt   . Alcohol abuse Maternal Uncle   . Alcohol abuse Maternal Grandfather    History  Substance Use Topics  . Smoking status: Current Every Day Smoker -- 0.50 packs/day for 25 years    Types: Cigarettes  . Smokeless tobacco: Never Used  . Alcohol Use: No   OB History    Gravida Para Term Preterm AB TAB SAB Ectopic Multiple Living   0              Review of Systems  Constitutional: Positive for fatigue. Negative for fever, chills and diaphoresis.  HENT:  Positive for congestion. Negative for rhinorrhea and sneezing.   Eyes: Negative.   Respiratory: Positive for cough, shortness of breath and wheezing. Negative for chest tightness.   Cardiovascular: Positive for chest pain. Negative for leg swelling.  Gastrointestinal: Negative for nausea, vomiting, abdominal pain, diarrhea and blood in stool.  Genitourinary: Negative for frequency, hematuria, flank pain and difficulty urinating.  Musculoskeletal: Negative for back pain and arthralgias.  Skin: Negative for rash.  Neurological: Negative for dizziness, speech difficulty, weakness, numbness and headaches.      Allergies  Codeine; Hydrocodone; Oxycodone; Trazodone and nefazodone; and Adhesive  Home Medications   Prior to Admission medications   Medication Sig Start Date End Date Taking? Authorizing Provider  buPROPion (WELLBUTRIN XL) 150 MG 24 hr tablet Take 150 mg by mouth every morning.   Yes Historical Provider, MD  buPROPion (WELLBUTRIN XL) 300 MG 24 hr tablet Take 300 mg by mouth every morning.    Yes Historical Provider, MD  calcium carbonate (TUMS - DOSED IN MG ELEMENTAL CALCIUM) 500 MG chewable tablet Chew 3 tablets by mouth 3 (three) times daily as needed. For nervous stomach   Yes Historical Provider, MD  diazepam (VALIUM) 10 MG tablet Take 10 mg by mouth 3 (three) times daily as needed. For anxiety   Yes Historical Provider, MD  DULoxetine (CYMBALTA) 60 MG capsule Take 120 mg by mouth every morning.   Yes Historical Provider, MD  hydrOXYzine (ATARAX/VISTARIL) 25 MG tablet Take 25 mg by mouth 3 (three) times daily as needed.   Yes Historical Provider, MD  ibuprofen (ADVIL,MOTRIN) 800 MG tablet Take 1 tablet (800 mg total) by mouth 3 (three) times daily. 02/13/14  Yes Heather Laisure, PA-C  levothyroxine (SYNTHROID, LEVOTHROID) 150 MCG tablet Take 150 mcg by mouth daily before breakfast.   Yes Historical Provider, MD  ranitidine (ZANTAC) 150 MG tablet Take 150 mg by mouth as needed  for heartburn.   Yes Historical Provider, MD  risperiDONE (RISPERDAL) 1 MG tablet Take 2 mg by mouth daily.   Yes Archer Asa, MD  topiramate (TOPAMAX) 200 MG tablet Take 200 mg by mouth 2 (two) times daily.    Yes Historical Provider, MD  benzonatate (TESSALON) 100 MG capsule Take 1 capsule (100 mg total) by mouth every 8 (eight) hours. 05/05/14   Rolan Bucco, MD   BP 96/66 mmHg  Pulse 84  Temp(Src) 97 F (36.1 C) (Oral)  Resp 20  Ht  (1.626 m)  Wt 290 lb (131.543 kg)  BMI 49.75 kg/m2  SpO2 95% Physical Exam  Constitutional: She is oriented to person, place, and time. She appears well-developed and well-nourished.  HENT:  Head: Normocephalic and atraumatic.  Eyes: Pupils are equal, round, and reactive to light.  Neck: Normal range of motion. Neck supple.  Cardiovascular: Normal rate, regular rhythm and normal heart sounds.   Pulmonary/Chest: Effort normal. No respiratory distress. She has wheezes. She has no rales. She exhibits no tenderness.  No increased work of breathing  Abdominal: Soft. Bowel sounds are normal. There is no tenderness. There is no rebound and no guarding.  Musculoskeletal: Normal range of motion. She exhibits no edema.  No calf tenderness  Lymphadenopathy:    She has no cervical adenopathy.  Neurological: She is alert and oriented to person, place, and time.  Skin: Skin is warm and dry. No rash noted.  Psychiatric: She has a normal mood and affect.    ED Course  Procedures (including critical care time) Labs Review Labs Reviewed  BASIC METABOLIC PANEL - Abnormal; Notable for the following:    Potassium 3.3 (*)    Glucose, Bld 134 (*)    Calcium 8.3 (*)    GFR calc non Af Amer 76 (*)    GFR calc Af Amer 88 (*)    Anion gap 3 (*)    All other components within normal limits  CBC WITH DIFFERENTIAL/PLATELET    Imaging Review Dg Chest 2 View  05/05/2014   CLINICAL DATA:  Shortness of breath and cough  EXAM: CHEST  2 VIEW  COMPARISON:   07/24/2013  FINDINGS: The heart size and mediastinal contours are within normal limits. Both lungs are clear. The visualized skeletal structures are unremarkable.  IMPRESSION: No active cardiopulmonary disease.   Electronically Signed   By: Christiana Pellant M.D.   On: 05/05/2014 17:33     EKG Interpretation None      Date: 05/05/2014  Rate: 80  Rhythm: normal sinus rhythm  QRS Axis: normal  Intervals: normal  ST/T Wave abnormalities: normal  Conduction Disutrbances:none  Narrative Interpretation:   Old EKG Reviewed: none available   MDM   Final diagnoses:  Bronchitis    Patient is given nebulizer treatment in the ED. Her lung sounds improved after this. Her sats have been normal throughout the ED course. She has no increased work of breathing. She has no persistent tachycardia or hypoxia that would be more consistent with pulmonary embolus. Her symptoms are consistent with bronchitis. She will continue the Levaquin as well as albuterol inhaler at home. She's are even given a shot of steroids today. I was going give her prescription for cough syrup but she's allergic to codeine hydrocodone and oxycodone. I gave her prescription for Tessalon Perles. I encouraged her to sleep in a recliner. Return precautions were given.    Rolan Bucco, MD 05/05/14 [phone]

## 2014-05-05 NOTE — Discharge Instructions (Signed)
Upper Respiratory Infection, Adult An upper respiratory infection (URI) is also sometimes known as the common cold. The upper respiratory tract includes the nose, sinuses, throat, trachea, and bronchi. Bronchi are the airways leading to the lungs. Most people improve within 1 week, but symptoms can last up to 2 weeks. A residual cough may last even longer.  CAUSES Many different viruses can infect the tissues lining the upper respiratory tract. The tissues become irritated and inflamed and often become very moist. Mucus production is also common. A cold is contagious. You can easily spread the virus to others by oral contact. This includes kissing, sharing a glass, coughing, or sneezing. Touching your mouth or nose and then touching a surface, which is then touched by another person, can also spread the virus. SYMPTOMS  Symptoms typically develop 1 to 3 days after you come in contact with a cold virus. Symptoms vary from person to person. They may include:  Runny nose.  Sneezing.  Nasal congestion.  Sinus irritation.  Sore throat.  Loss of voice (laryngitis).  Cough.  Fatigue.  Muscle aches.  Loss of appetite.  Headache.  Low-grade fever. DIAGNOSIS  You might diagnose your own cold based on familiar symptoms, since most people get a cold 2 to 3 times a year. Your caregiver can confirm this based on your exam. Most importantly, your caregiver can check that your symptoms are not due to another disease such as strep throat, sinusitis, pneumonia, asthma, or epiglottitis. Blood tests, throat tests, and X-rays are not necessary to diagnose a common cold, but they may sometimes be helpful in excluding other more serious diseases. Your caregiver will decide if any further tests are required. RISKS AND COMPLICATIONS  You may be at risk for a more severe case of the common cold if you smoke cigarettes, have chronic heart disease (such as heart failure) or lung disease (such as asthma), or if  you have a weakened immune system. The very young and very old are also at risk for more serious infections. Bacterial sinusitis, middle ear infections, and bacterial pneumonia can complicate the common cold. The common cold can worsen asthma and chronic obstructive pulmonary disease (COPD). Sometimes, these complications can require emergency medical care and may be life-threatening. PREVENTION  The best way to protect against getting a cold is to practice good hygiene. Avoid oral or hand contact with people with cold symptoms. Wash your hands often if contact occurs. There is no clear evidence that vitamin C, vitamin E, echinacea, or exercise reduces the chance of developing a cold. However, it is always recommended to get plenty of rest and practice good nutrition. TREATMENT  Treatment is directed at relieving symptoms. There is no cure. Antibiotics are not effective, because the infection is caused by a virus, not by bacteria. Treatment may include:  Increased fluid intake. Sports drinks offer valuable electrolytes, sugars, and fluids.  Breathing heated mist or steam (vaporizer or shower).  Eating chicken soup or other clear broths, and maintaining good nutrition.  Getting plenty of rest.  Using gargles or lozenges for comfort.  Controlling fevers with ibuprofen or acetaminophen as directed by your caregiver.  Increasing usage of your inhaler if you have asthma. Zinc gel and zinc lozenges, taken in the first 24 hours of the common cold, can shorten the duration and lessen the severity of symptoms. Pain medicines may help with fever, muscle aches, and throat pain. A variety of non-prescription medicines are available to treat congestion and runny nose. Your caregiver   can make recommendations and may suggest nasal or lung inhalers for other symptoms.  HOME CARE INSTRUCTIONS   Only take over-the-counter or prescription medicines for pain, discomfort, or fever as directed by your  caregiver.  Use a warm mist humidifier or inhale steam from a shower to increase air moisture. This may keep secretions moist and make it easier to breathe.  Drink enough water and fluids to keep your urine clear or pale yellow.  Rest as needed.  Return to work when your temperature has returned to normal or as your caregiver advises. You may need to stay home longer to avoid infecting others. You can also use a face mask and careful hand washing to prevent spread of the virus. SEEK MEDICAL CARE IF:   After the first few days, you feel you are getting worse rather than better.  You need your caregiver's advice about medicines to control symptoms.  You develop chills, worsening shortness of breath, or brown or red sputum. These may be signs of pneumonia.  You develop yellow or brown nasal discharge or pain in the face, especially when you bend forward. These may be signs of sinusitis.  You develop a fever, swollen neck glands, pain with swallowing, or white areas in the back of your throat. These may be signs of strep throat. SEEK IMMEDIATE MEDICAL CARE IF:   You have a fever.  You develop severe or persistent headache, ear pain, sinus pain, or chest pain.  You develop wheezing, a prolonged cough, cough up blood, or have a change in your usual mucus (if you have chronic lung disease).  You develop sore muscles or a stiff neck. Document Released: 09/10/2000 Document Revised: 06/09/2011 Document Reviewed: 06/22/2013 Exit[employer] Patient Information 2015 [employer]. This information is not intended to replace advice given to you by your health care provider. Make sure you discuss any questions you have with your health care provider.  

## 2014-05-05 NOTE — ED Notes (Signed)
Pt states she was by Dr Elise Benneuran (PCP) Tuesday and diagnosed with Bronchitis, placed on Levaquin and steroids - reports no improvement so she returned to PCP today and she was given a Steroid Inj and Breathing Treatment with Pneumonia diagnosis - reports she went home, layed down and developed shortness of breath - states she called PCP and was advised to come to ED.

## 2014-06-28 ENCOUNTER — Other Ambulatory Visit: Payer: Self-pay | Admitting: Obstetrics and Gynecology

## 2014-06-29 LAB — CYTOLOGY - PAP

## 2014-08-22 ENCOUNTER — Other Ambulatory Visit: Payer: Self-pay | Admitting: Obstetrics and Gynecology

## 2014-08-23 LAB — CYTOLOGY - PAP

## 2015-09-15 ENCOUNTER — Encounter (HOSPITAL_BASED_OUTPATIENT_CLINIC_OR_DEPARTMENT_OTHER): Payer: Self-pay | Admitting: *Deleted

## 2015-09-15 ENCOUNTER — Emergency Department (HOSPITAL_BASED_OUTPATIENT_CLINIC_OR_DEPARTMENT_OTHER)
Admission: EM | Admit: 2015-09-15 | Discharge: 2015-09-15 | Disposition: A | Discharge status: Home / Self Care | Payer: Medicare Other | Attending: Emergency Medicine | Admitting: Emergency Medicine

## 2015-09-15 DIAGNOSIS — F17210 Nicotine dependence, cigarettes, uncomplicated: Secondary | ICD-10-CM | POA: Diagnosis not present

## 2015-09-15 DIAGNOSIS — K029 Dental caries, unspecified: Secondary | ICD-10-CM | POA: Diagnosis not present

## 2015-09-15 DIAGNOSIS — F321 Major depressive disorder, single episode, moderate: Secondary | ICD-10-CM | POA: Insufficient documentation

## 2015-09-15 DIAGNOSIS — E039 Hypothyroidism, unspecified: Secondary | ICD-10-CM | POA: Insufficient documentation

## 2015-09-15 DIAGNOSIS — K089 Disorder of teeth and supporting structures, unspecified: Secondary | ICD-10-CM | POA: Diagnosis present

## 2015-09-15 MED ORDER — LIDOCAINE VISCOUS 2 % MT SOLN
15.0000 mL | Freq: Once | OROMUCOSAL | Status: AC
  Administered 2015-09-15: 15 mL via OROMUCOSAL
  Filled 2015-09-15: qty 15

## 2015-09-15 MED ORDER — PENICILLIN V POTASSIUM 250 MG PO TABS
500.0000 mg | ORAL_TABLET | Freq: Once | ORAL | Status: AC
  Administered 2015-09-15: 500 mg via ORAL
  Filled 2015-09-15: qty 2

## 2015-09-15 MED ORDER — NAPROXEN 250 MG PO TABS
500.0000 mg | ORAL_TABLET | Freq: Once | ORAL | Status: AC
  Administered 2015-09-15: 500 mg via ORAL
  Filled 2015-09-15: qty 2

## 2015-09-15 MED ORDER — PENICILLIN V POTASSIUM 500 MG PO TABS: 500.0000 mg | ORAL_TABLET | Freq: Four times a day (QID) | ORAL | Status: AC

## 2015-09-15 MED ORDER — DICLOFENAC SODIUM ER 100 MG PO TB24
100.0000 mg | ORAL_TABLET | Freq: Every day | ORAL | Status: DC
Start: 2015-09-15 — End: 2016-01-15

## 2015-09-15 NOTE — ED Provider Notes (Signed)
CSN: [id number]     Arrival date & time 09/15/15  0216 History   First MD Initiated Contact with Patient 09/15/15 0241     Chief Complaint  Patient presents with  . Dental Pain     (Consider location/radiation/quality/duration/timing/severity/associated sxs/prior Treatment) Patient is a 45 y.o. female presenting with tooth pain. The history is provided by the patient.  Dental Pain Location:  Lower Lower teeth location:  31/RL 2nd molar Quality:  Dull Severity:  Moderate Onset quality:  Gradual Duration:  3 days Timing:  Constant Progression:  Unchanged Chronicity:  New Context: poor dentition   Context: not abscess and cap still on   Previous work-up:  Dental exam, filled cavity and root canal Relieved by:  Nothing Worsened by:  Nothing tried Ineffective treatments:  None tried Associated symptoms: no congestion, no fever and no neck swelling   Risk factors: no diabetes     Past Medical History  Diagnosis Date  . Hypothyroidism   . Anxiety   . Borderline personality disorder   . NAFL (nonalcoholic fatty liver)   . Premature ovarian failure   . GERD (gastroesophageal reflux disease)   . History of vulvar dysplasia     vin I  --- s/p  wide excision 06-21-2012   . Bipolar 1 disorder (HCC)   . Moderate major depression (HCC)   . Cervical dysplasia   . Wears glasses    Past Surgical History  Procedure Laterality Date  . Hysteroscopy w/d&c  10/17/2010    Procedure: DILATATION AND CURETTAGE (D&C) /HYSTEROSCOPY;  Surgeon: Jessee Avers;  Location: [facility];  Service: Gynecology;  Laterality: N/A;  . Leep  01/09/2011    Procedure: LOOP ELECTROSURGICAL EXCISION PROCEDURE (LEEP);  Surgeon: [name];  Location: [facility];  Service: Gynecology;  Laterality: N/A;  . Laparoscopic tubal ligation  01/09/2011    Procedure: LAPAROSCOPIC TUBAL LIGATION;  Surgeon: [name];  Location: [facility];  Service: Gynecology;  Laterality: Bilateral;  with Filshie clips  . Hysteroscopy  w/d&c N/A 06/21/2012    Procedure: DILATATION AND CURETTAGE /HYSTEROSCOPY;  Surgeon: [name], MD;  Location: [facility];  Service: Gynecology;  Laterality: N/A;  . Vulvectomy N/A 06/21/2012    Procedure: WIDE EXCISION VULVECTOMY;  Surgeon: [name], MD;  Location: [facility];  Service: Gynecology;  Laterality: N/A;  excision of vulvar lesions,  need colposcope  . Carpal tunnel release Bilateral LEFT  2013//  RIGHT  JAN  2015  . Cholecystectomy  2011  . Orif left foot fx  2008    HARDWARE  REMOVED 6 MONTHS LATER  . Negative sleep study  2014    per pt  . Leep N/A 09/16/2013    Procedure: LOOP ELECTROSURGICAL EXCISION PROCEDURE (LEEP);  Surgeon: [name], MD;  Location: [facility];  Service: Gynecology;  Laterality: N/A;   Family History  Problem Relation Age of Onset  . Cancer Mother   . Mental illness Mother   . Alcohol abuse Mother   . Drug abuse Mother   . Diabetes Father   . Bipolar disorder Sister   . Alcohol abuse Maternal Aunt   . Drug abuse Maternal Aunt   . Depression Maternal Aunt   . Schizophrenia Maternal Aunt   . Alcohol abuse Maternal Uncle   . Alcohol abuse Maternal Grandfather    Social History  Substance Use Topics  . Smoking status: Current Every Day Smoker -- 0.50 packs/day for 25 years    Types:  Cigarettes  . Smokeless tobacco: Never Used  . Alcohol Use: No   OB History    Gravida Para Term Preterm AB TAB SAB Ectopic Multiple Living   0              Review of Systems  Constitutional: Negative for fever.  HENT: Positive for dental problem. Negative for congestion.   All other systems reviewed and are negative.     Allergies  Codeine; Hydrocodone; Oxycodone; Trazodone and nefazodone; and Adhesive  Home Medications   Prior to Admission medications   Medication Sig Start Date End Date Taking? Authorizing Provider  benzonatate (TESSALON) 100 MG capsule Take 1 capsule (100 mg total) by mouth every 8 (eight) hours.  05/05/14   Rolan Bucco, MD  buPROPion (WELLBUTRIN XL) 150 MG 24 hr tablet Take 150 mg by mouth every morning.    Historical Provider, MD  buPROPion (WELLBUTRIN XL) 300 MG 24 hr tablet Take 300 mg by mouth every morning.     Historical Provider, MD  calcium carbonate (TUMS - DOSED IN MG ELEMENTAL CALCIUM) 500 MG chewable tablet Chew 3 tablets by mouth 3 (three) times daily as needed. For nervous stomach    Historical Provider, MD  diazepam (VALIUM) 10 MG tablet Take 10 mg by mouth 3 (three) times daily as needed. For anxiety    Historical Provider, MD  DULoxetine (CYMBALTA) 60 MG capsule Take 120 mg by mouth every morning.    Historical Provider, MD  hydrOXYzine (ATARAX/VISTARIL) 25 MG tablet Take 25 mg by mouth 3 (three) times daily as needed.    Historical Provider, MD  ibuprofen (ADVIL,MOTRIN) 800 MG tablet Take 1 tablet (800 mg total) by mouth 3 (three) times daily. 02/13/14   Heather Laisure, PA-C  levothyroxine (SYNTHROID, LEVOTHROID) 150 MCG tablet Take 150 mcg by mouth daily before breakfast.    Historical Provider, MD  ranitidine (ZANTAC) 150 MG tablet Take 150 mg by mouth as needed for heartburn.    Historical Provider, MD  risperiDONE (RISPERDAL) 1 MG tablet Take 2 mg by mouth daily.    Archer Asa, MD  topiramate (TOPAMAX) 200 MG tablet Take 200 mg by mouth 2 (two) times daily.     Historical Provider, MD   BP 127/83 mmHg  Pulse 81  Temp(Src) 97.9 F (36.6 C)  Resp 20  Ht  (1.626 m)  Wt 300 lb (136.079 kg)  BMI 51.47 kg/m2  SpO2 100% Physical Exam  Constitutional: She is oriented to person, place, and time. She appears well-developed and well-nourished. No distress.  HENT:  Head: Normocephalic and atraumatic.  Mouth/Throat: Oropharynx is clear and moist.    Eyes: Conjunctivae are normal. Pupils are equal, round, and reactive to light.  Neck: Normal range of motion. Neck supple.  Cardiovascular: Normal rate, regular rhythm and intact distal pulses.   Pulmonary/Chest:  Effort normal and breath sounds normal. No respiratory distress. She has no wheezes. She has no rales.  Abdominal: Soft. Bowel sounds are normal. There is no tenderness. There is no rebound and no guarding.  Musculoskeletal: Normal range of motion.  Lymphadenopathy:    She has no cervical adenopathy.  Neurological: She is alert and oriented to person, place, and time.  Skin: Skin is warm and dry.  Psychiatric: She has a normal mood and affect.    ED Course  Procedures (including critical care time) Labs Review Labs Reviewed - No data to display  Imaging Review No results found. I have personally reviewed and evaluated these  images and lab results as part of my medical decision-making.   EKG Interpretation None      MDM   Final diagnoses:  None   Filed Vitals:   09/15/15 0222  BP: 127/83  Pulse: 81  Temp: 97.9 F (36.6 C)  Resp: 20    Medications  naproxen (NAPROSYN) tablet 500 mg (500 mg Oral Given 09/15/15 0250)  penicillin v potassium (VEETID) tablet 500 mg (500 mg Oral Given 09/15/15 0250)  lidocaine (XYLOCAINE) 2 % viscous mouth solution 15 mL (15 mLs Mouth/Throat Given 09/15/15 0250)    Will treat with NSAID and pen VK.  Follow up with your dentist.  Strict return precautions given   Deziree Mokry, MD 09/15/15 [id number]

## 2015-09-15 NOTE — Discharge Instructions (Signed)
Dental Care and Dentist Visits °Dental care supports good overall health. Regular dental visits can also help you avoid dental pain, bleeding, infection, and other more serious health problems in the future. It is important to keep the mouth healthy because diseases in the teeth, gums, and other oral tissues can spread to other areas of the body. Some problems, such as diabetes, heart disease, and pre-term labor have been associated with poor oral health.  °See your dentist every 6 months. If you experience emergency problems such as a toothache or broken tooth, go to the dentist right away. If you see your dentist regularly, you may catch problems early. It is easier to be treated for problems in the early stages.  °WHAT TO EXPECT AT A DENTIST VISIT  °Your dentist will look for many common oral health problems and recommend proper treatment. At your regular dental visit, you can expect: °· Gentle cleaning of the teeth and gums. This includes scraping and polishing. This helps to remove the sticky substance around the teeth and gums (plaque). Plaque forms in the mouth shortly after eating. Over time, plaque hardens on the teeth as tartar. If tartar is not removed regularly, it can cause problems. Cleaning also helps remove stains. °· Periodic X-rays. These pictures of the teeth and supporting bone will help your dentist assess the health of your teeth. °· Periodic fluoride treatments. Fluoride is a natural mineral shown to help strengthen teeth. Fluoride treatment involves applying a fluoride gel or varnish to the teeth. It is most commonly done in children. °· Examination of the mouth, tongue, jaws, teeth, and gums to look for any oral health problems, such as: °¨ Cavities (dental caries). This is decay on the tooth caused by plaque, sugar, and acid in the mouth. It is best to catch a cavity when it is small. °¨ Inflammation of the gums caused by plaque buildup (gingivitis). °¨ Problems with the mouth or malformed  or misaligned teeth. °¨ Oral cancer or other diseases of the soft tissues or jaws.  °KEEP YOUR TEETH AND GUMS HEALTHY °For healthy teeth and gums, follow these general guidelines as well as your dentist's specific advice: °· Have your teeth professionally cleaned at the dentist every 6 months. °· Brush twice daily with a fluoride toothpaste. °· Floss your teeth daily.  °· Ask your dentist if you need fluoride supplements, treatments, or fluoride toothpaste. °· Eat a healthy diet. Reduce foods and drinks with added sugar. °· Avoid smoking. °TREATMENT FOR ORAL HEALTH PROBLEMS °If you have oral health problems, treatment varies depending on the conditions present in your teeth and gums. °· Your caregiver will most likely recommend good oral hygiene at each visit. °· For cavities, gingivitis, or other oral health disease, your caregiver will perform a procedure to treat the problem. This is typically done at a separate appointment. Sometimes your caregiver will refer you to another dental specialist for specific tooth problems or for surgery. °SEEK IMMEDIATE DENTAL CARE IF: °· You have pain, bleeding, or soreness in the gum, tooth, jaw, or mouth area. °· A permanent tooth becomes loose or separated from the gum socket. °· You experience a blow or injury to the mouth or jaw area. °  °This information is not intended to replace advice given to you by your health care provider. Make sure you discuss any questions you have with your health care provider. °  °Document Released: 11/27/2010 Document Revised: 06/09/2011 Document Reviewed: 11/27/2010 °Elsevier Interactive Patient Education ©2016 [employer] ° °

## 2015-09-15 NOTE — ED Notes (Signed)
Right lower dental pain for several days.

## 2016-01-15 ENCOUNTER — Emergency Department (HOSPITAL_BASED_OUTPATIENT_CLINIC_OR_DEPARTMENT_OTHER): Payer: Medicare Other

## 2016-01-15 ENCOUNTER — Emergency Department (HOSPITAL_BASED_OUTPATIENT_CLINIC_OR_DEPARTMENT_OTHER)
Admission: EM | Admit: 2016-01-15 | Discharge: 2016-01-15 | Disposition: A | Discharge status: Home / Self Care | Payer: Medicare Other | Attending: Emergency Medicine | Admitting: Emergency Medicine

## 2016-01-15 ENCOUNTER — Encounter (HOSPITAL_BASED_OUTPATIENT_CLINIC_OR_DEPARTMENT_OTHER): Payer: Self-pay | Admitting: Emergency Medicine

## 2016-01-15 DIAGNOSIS — Z79899 Other long term (current) drug therapy: Secondary | ICD-10-CM | POA: Diagnosis not present

## 2016-01-15 DIAGNOSIS — Z791 Long term (current) use of non-steroidal anti-inflammatories (NSAID): Secondary | ICD-10-CM | POA: Insufficient documentation

## 2016-01-15 DIAGNOSIS — R1010 Upper abdominal pain, unspecified: Secondary | ICD-10-CM | POA: Diagnosis not present

## 2016-01-15 DIAGNOSIS — E039 Hypothyroidism, unspecified: Secondary | ICD-10-CM | POA: Insufficient documentation

## 2016-01-15 DIAGNOSIS — F17210 Nicotine dependence, cigarettes, uncomplicated: Secondary | ICD-10-CM | POA: Insufficient documentation

## 2016-01-15 LAB — CBC WITH DIFFERENTIAL/PLATELET
BASOS ABS: 0 10*3/uL (ref 0.0–0.1)
BASOS PCT: 0 %
EOS ABS: 0 10*3/uL (ref 0.0–0.7)
EOS PCT: 0 %
HCT: 45.1 % (ref 36.0–46.0)
Hemoglobin: 15.2 g/dL — ABNORMAL HIGH (ref 12.0–15.0)
Lymphocytes Relative: 17 %
Lymphs Abs: 1.3 10*3/uL (ref 0.7–4.0)
MCH: 30 pg (ref 26.0–34.0)
MCHC: 33.7 g/dL (ref 30.0–36.0)
MCV: 89 fL (ref 78.0–100.0)
Monocytes Absolute: 1 10*3/uL (ref 0.1–1.0)
Monocytes Relative: 13 %
Neutro Abs: 5.1 10*3/uL (ref 1.7–7.7)
Neutrophils Relative %: 70 %
PLATELETS: 370 10*3/uL (ref 150–400)
RBC: 5.07 MIL/uL (ref 3.87–5.11)
RDW: 13.3 % (ref 11.5–15.5)
WBC: 7.3 10*3/uL (ref 4.0–10.5)

## 2016-01-15 LAB — COMPREHENSIVE METABOLIC PANEL
ALK PHOS: 74 U/L (ref 38–126)
ALT: 26 U/L (ref 14–54)
AST: 21 U/L (ref 15–41)
Albumin: 4.3 g/dL (ref 3.5–5.0)
Anion gap: 8 (ref 5–15)
BILIRUBIN TOTAL: 0.2 mg/dL — AB (ref 0.3–1.2)
BUN: 20 mg/dL (ref 6–20)
CALCIUM: 9 mg/dL (ref 8.9–10.3)
CHLORIDE: 106 mmol/L (ref 101–111)
CO2: 23 mmol/L (ref 22–32)
CREATININE: 1.13 mg/dL — AB (ref 0.44–1.00)
GFR, EST NON AFRICAN AMERICAN: 59 mL/min — AB (ref >60)
Glucose, Bld: 88 mg/dL (ref 65–99)
Potassium: 3.7 mmol/L (ref 3.5–5.1)
Sodium: 137 mmol/L (ref 135–145)
TOTAL PROTEIN: 7.9 g/dL (ref 6.5–8.1)

## 2016-01-15 LAB — LIPASE, BLOOD: LIPASE: 40 U/L (ref 11–51)

## 2016-01-15 LAB — TROPONIN I

## 2016-01-15 LAB — PREGNANCY, URINE: Preg Test, Ur: NEGATIVE

## 2016-01-15 MED ORDER — FAMOTIDINE IN NACL 20-0.9 MG/50ML-% IV SOLN
20.0000 mg | Freq: Once | INTRAVENOUS | Status: AC
  Administered 2016-01-15: 20 mg via INTRAVENOUS
  Filled 2016-01-15: qty 50

## 2016-01-15 MED ORDER — ONDANSETRON HCL 4 MG/2ML IJ SOLN
4.0000 mg | Freq: Once | INTRAMUSCULAR | Status: DC
  Filled 2016-01-15: qty 2

## 2016-01-15 MED ORDER — PANTOPRAZOLE SODIUM 40 MG PO TBEC: 40.0000 mg | DELAYED_RELEASE_TABLET | Freq: Two times a day (BID) | ORAL | 0 refills | Status: DC

## 2016-01-15 MED ORDER — FENTANYL CITRATE (PF) 100 MCG/2ML IJ SOLN
100.0000 ug | Freq: Once | INTRAMUSCULAR | Status: AC
  Administered 2016-01-15: 100 ug via INTRAVENOUS
  Filled 2016-01-15: qty 2

## 2016-01-15 MED ORDER — FAMOTIDINE 20 MG PO TABS: 20.0000 mg | ORAL_TABLET | Freq: Two times a day (BID) | ORAL | 0 refills | Status: DC

## 2016-01-15 MED ORDER — SODIUM CHLORIDE 0.9 % IV BOLUS (SEPSIS)
1000.0000 mL | Freq: Once | INTRAVENOUS | Status: AC
  Administered 2016-01-15: 1000 mL via INTRAVENOUS

## 2016-01-15 MED ORDER — GI COCKTAIL ~~LOC~~
30.0000 mL | Freq: Once | ORAL | Status: AC
  Administered 2016-01-15: 30 mL via ORAL
  Filled 2016-01-15: qty 30

## 2016-01-15 NOTE — ED Provider Notes (Signed)
[facility]   CSN: [id number] Arrival date & time: 01/15/16  1454     History   Chief Complaint Chief Complaint  Patient presents with  . Abdominal Pain    HPI Lindsey Nicholson is a 45 y.o. female.  HPI  45 year old female presents with upper abdominal pain. Started around 45 minutes ago. Patient states she was sitting on a couch playing on her phone when all of a sudden it started. She last ate 2 hours ago and had chicken strips. No nausea, vomiting, back pain, or radiation of the pain. Feels like a dull ache. Has never had pain like this before. 8 or 9 years ago had her gallbladder removed, had right upper quadrant pain that was much different than this. No shortness of breath, diaphoresis, or chest pain but the pain is essentially just under her sternum. Pain is 7/10. Has not taken anything for pain.  Past Medical History:  Diagnosis Date  . Anxiety   . Bipolar 1 disorder (HCC)   . Borderline personality disorder   . Cervical dysplasia   . GERD (gastroesophageal reflux disease)   . History of vulvar dysplasia    vin I  --- s/p  wide excision 06-21-2012   . Hypothyroidism   . Moderate major depression (HCC)   . NAFL (nonalcoholic fatty liver)   . Premature ovarian failure   . Wears glasses     Patient Active Problem List   Diagnosis Date Noted  . 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 Bipolar 1 disorder, depressed (HCC) 04/16/2012  . Depression, major, recurrent, moderate (HCC) 04/01/2012    Past Surgical History:  Procedure Laterality Date  . CARPAL TUNNEL RELEASE Bilateral LEFT  2013//  RIGHT  JAN  2015  . CHOLECYSTECTOMY  2011  . HYSTEROSCOPY W/D&C  10/17/2010   Procedure: DILATATION AND CURETTAGE (D&C) /HYSTEROSCOPY;  Surgeon: Jessee Avers;  Location: [facility];  Service: Gynecology;  Laterality: N/A;  . HYSTEROSCOPY W/D&C N/A 06/21/2012   Procedure: DILATATION AND CURETTAGE /HYSTEROSCOPY;  Surgeon: [name], MD;  Location: [facility];   Service: Gynecology;  Laterality: N/A;  . LAPAROSCOPIC TUBAL LIGATION  01/09/2011   Procedure: LAPAROSCOPIC TUBAL LIGATION;  Surgeon: [name];  Location: [facility];  Service: Gynecology;  Laterality: Bilateral;  with Filshie clips  . LEEP  01/09/2011   Procedure: LOOP ELECTROSURGICAL EXCISION PROCEDURE (LEEP);  Surgeon: [name];  Location: [facility];  Service: Gynecology;  Laterality: N/A;  . LEEP N/A 09/16/2013   Procedure: LOOP ELECTROSURGICAL EXCISION PROCEDURE (LEEP);  Surgeon: [name], MD;  Location: [facility];  Service: Gynecology;  Laterality: N/A;  . NEGATIVE SLEEP STUDY  2014   per pt  . ORIF LEFT FOOT FX  2008   HARDWARE  REMOVED 6 MONTHS LATER  . VULVECTOMY N/A 06/21/2012   Procedure: WIDE EXCISION VULVECTOMY;  Surgeon: [name], MD;  Location: [facility];  Service: Gynecology;  Laterality: N/A;  excision of vulvar lesions,  need colposcope    OB History    Gravida Para Term Preterm AB Living   0             SAB TAB Ectopic Multiple Live Births                   Home Medications    Prior to Admission medications   Medication Sig Start Date End Date Taking? Authorizing Provider  amantadine (SYMMETREL) 100 MG capsule Take 100 mg  by mouth 2 (two) times daily.   Yes Historical Provider, MD  buPROPion (WELLBUTRIN XL) 150 MG 24 hr tablet Take 150 mg by mouth every morning.   Yes Historical Provider, MD  buPROPion (WELLBUTRIN XL) 300 MG 24 hr tablet Take 300 mg by mouth every morning.    Yes Historical Provider, MD  hydrOXYzine (ATARAX/VISTARIL) 25 MG tablet Take 25 mg by mouth 3 (three) times daily as needed.   Yes Historical Provider, MD  ibuprofen (ADVIL,MOTRIN) 800 MG tablet Take 1 tablet (800 mg total) by mouth 3 (three) times daily. 02/13/14  Yes Heather Laisure, PA-C  levothyroxine (SYNTHROID, LEVOTHROID) 150 MCG tablet Take 150 mcg by mouth daily before breakfast.   Yes Historical Provider, MD  zolpidem (AMBIEN) 10 MG tablet  Take 10 mg by mouth at bedtime as needed for sleep.   Yes Historical Provider, MD  DULoxetine (CYMBALTA) 60 MG capsule Take 120 mg by mouth every morning.    Historical Provider, MD  famotidine (PEPCID) 20 MG tablet Take 1 tablet (20 mg total) by mouth 2 (two) times daily. 01/15/16   Pricilla LovelessScott Vignesh Willert, MD  pantoprazole (PROTONIX) 40 MG tablet Take 1 tablet (40 mg total) by mouth 2 (two) times daily. 01/15/16   Pricilla LovelessScott Mariyanna Mucha, MD    Family History Family History  Problem Relation Age of Onset  . Cancer Mother   . Mental illness Mother   . Alcohol abuse Mother   . Drug abuse Mother   . Bipolar disorder Sister   . Alcohol abuse Maternal Aunt   . Drug abuse Maternal Aunt   . Depression Maternal Aunt   . Schizophrenia Maternal Aunt   . Alcohol abuse Maternal Uncle   . Alcohol abuse Maternal Grandfather   . Diabetes Father     Social History Social History  Substance Use Topics  . Smoking status: Current Every Day Smoker    Packs/day: 0.50    Years: 25.00    Types: Cigarettes  . Smokeless tobacco: Never Used  . Alcohol use No     Allergies   Codeine; Hydrocodone; Oxycodone; Trazodone and nefazodone; and Adhesive [tape]   Review of Systems Review of Systems  Respiratory: Negative for shortness of breath.   Cardiovascular: Negative for chest pain.  Gastrointestinal: Positive for abdominal pain. Negative for diarrhea, nausea and vomiting.  Genitourinary: Negative for dysuria.  Musculoskeletal: Negative for back pain.  All other systems reviewed and are negative.    Physical Exam Updated Vital Signs BP 104/56   Pulse 78   Temp 98.4 F (36.9 C) (Oral)   Resp 17   Ht 5\' 4"  (1.626 m)   Wt 300 lb (136.1 kg)   SpO2 100%   BMI 51.49 kg/m   Physical Exam  Constitutional: She is oriented to person, place, and time. She appears well-developed and well-nourished.  Obese, resting comfortably  HENT:  Head: Normocephalic and atraumatic.  Right Ear: External ear normal.    Left Ear: External ear normal.  Nose: Nose normal.  Eyes: Right eye exhibits no discharge. Left eye exhibits no discharge.  Cardiovascular: Normal rate, regular rhythm and normal heart sounds.   Pulmonary/Chest: Effort normal and breath sounds normal. She exhibits no tenderness.  Abdominal: Soft. There is tenderness (mild) in the epigastric area.    Neurological: She is alert and oriented to person, place, and time.  Skin: Skin is warm and dry.  Nursing note and vitals reviewed.    ED Treatments / Results  Labs (all labs ordered are  listed, but only abnormal results are displayed) Labs Reviewed  COMPREHENSIVE METABOLIC PANEL - Abnormal; Notable for the following:       Result Value   Creatinine, Ser 1.13 (*)    Total Bilirubin 0.2 (*)    GFR calc non Af Amer 59 (*)    All other components within normal limits  CBC WITH DIFFERENTIAL/PLATELET - Abnormal; Notable for the following:    Hemoglobin 15.2 (*)    All other components within normal limits  LIPASE, BLOOD  TROPONIN I  PREGNANCY, URINE  TROPONIN I    EKG  EKG Interpretation  Date/Time:  Tuesday January 15 2016 15:10:27 EDT Ventricular Rate:  71 PR Interval:    QRS Duration: 112 QT Interval:  400 QTC Calculation: 435 R Axis:   22 Text Interpretation:  Sinus rhythm Borderline intraventricular conduction delay Abnormal R-wave progression, early transition no significant change since Feb 2016 Confirmed by Tayson Schnelle MD, Starnisha Batrez [phone]) on 01/15/2016 3:15:43 PM       EKG Interpretation  Date/Time:  Tuesday January 15 2016 20:28:37 EDT Ventricular Rate:  75 PR Interval:    QRS Duration: 106 QT Interval:  398 QTC Calculation: 445 R Axis:   37 Text Interpretation:  Sinus rhythm Abnormal R-wave progression, early transition no significant change from earlier in the day Confirmed by Tykel Badie MD, Takeshi Teasdale [phone]) on 01/15/2016 8:32:47 PM        Radiology Dg Chest 2 View  Result Date: 01/15/2016 CLINICAL DATA:   Upper abdominal pain.  Pain under right breast. EXAM: CHEST  2 VIEW COMPARISON:  05/05/2014 FINDINGS: The heart size and mediastinal contours are within normal limits. Both lungs are clear. The visualized skeletal structures are unremarkable. IMPRESSION: No active cardiopulmonary disease. Electronically Signed   By: Marlan Palau M.D.   On: 01/15/2016 16:04    Procedures Procedures (including critical care time)  Medications Ordered in ED Medications  ondansetron (ZOFRAN) injection 4 mg (4 mg Intravenous Refused 01/15/16 1924)  gi cocktail (Maalox,Lidocaine,Donnatal) (30 mLs Oral Given 01/15/16 1600)  fentaNYL (SUBLIMAZE) injection 100 mcg (100 mcg Intravenous Given 01/15/16 1656)  famotidine (PEPCID) IVPB 20 mg premix (0 mg Intravenous Stopped 01/15/16 1734)  sodium chloride 0.9 % bolus 1,000 mL (0 mLs Intravenous Stopped 01/15/16 2003)     Initial Impression / Assessment and Plan / ED Course  I have reviewed the triage vital signs and the nursing notes.  Pertinent labs & imaging results that were available during my care of the patient were reviewed by me and considered in my medical decision making (see chart for details).  Clinical Course  Comment By Time  This is probably gastric but given location will get troponin in addition to LFTs/lipase. Abd exam with only mild tenderness. Doubt ACS, would be quite atypical. GI cocktail, reassess. Pricilla Loveless, MD 10/17 1538  GI cocktail has not helped. Will try other meds, now is nauseated. Pricilla Loveless, MD 10/17 [phone]  Workup reassuring so far. Will get 2nd troponin, and do pepcid.  Pricilla Loveless, MD 10/17 1654  Pain is improving. Waiting on 2nd troponin. Pricilla Loveless, MD 10/17 1820  Second troponin is negative. Will repeat ECG but unless there are dynamic changes I think ACS is quite unlikely. Likely GI related. Protonix, Pepcid, and follow-up with PCP. Discussed return precautions. Pain is gone. Pricilla Loveless, MD 10/17 2028     Final Clinical Impressions(s) / ED Diagnoses   Final diagnoses:  Upper abdominal pain    New Prescriptions New Prescriptions  FAMOTIDINE (PEPCID) 20 MG TABLET    Take 1 tablet (20 mg total) by mouth 2 (two) times daily.   PANTOPRAZOLE (PROTONIX) 40 MG TABLET    Take 1 tablet (40 mg total) by mouth 2 (two) times daily.     Pricilla Loveless, MD 01/15/16 2033

## 2016-01-15 NOTE — ED Notes (Signed)
MD at bedside. 

## 2016-01-15 NOTE — ED Notes (Signed)
Pt c/o nausea nd pain. MD aware.

## 2016-01-15 NOTE — ED Triage Notes (Signed)
Pt in c/o RUQ and LUQ pain under breasts onset today many hours after eating, describes pain as burning, denies hx of same. Pt is alert, interactive, in NAD.

## 2016-01-15 NOTE — ED Notes (Signed)
Requesting something to eat. Given graham crackers, peanut butter and apple juice.

## 2016-01-15 NOTE — ED Notes (Signed)
Went in to give pt zofran. Pt states she is not nauseated now and does not want zofran.

## 2016-02-16 ENCOUNTER — Emergency Department (HOSPITAL_BASED_OUTPATIENT_CLINIC_OR_DEPARTMENT_OTHER): Payer: Medicare Other

## 2016-02-16 ENCOUNTER — Emergency Department (HOSPITAL_BASED_OUTPATIENT_CLINIC_OR_DEPARTMENT_OTHER)
Admission: EM | Admit: 2016-02-16 | Discharge: 2016-02-17 | Disposition: A | Discharge status: Home / Self Care | Payer: Medicare Other | Attending: Emergency Medicine | Admitting: Emergency Medicine

## 2016-02-16 ENCOUNTER — Encounter (HOSPITAL_BASED_OUTPATIENT_CLINIC_OR_DEPARTMENT_OTHER): Payer: Self-pay | Admitting: Emergency Medicine

## 2016-02-16 DIAGNOSIS — J9801 Acute bronchospasm: Secondary | ICD-10-CM | POA: Diagnosis not present

## 2016-02-16 DIAGNOSIS — R0602 Shortness of breath: Secondary | ICD-10-CM | POA: Diagnosis present

## 2016-02-16 DIAGNOSIS — Z87891 Personal history of nicotine dependence: Secondary | ICD-10-CM | POA: Insufficient documentation

## 2016-02-16 DIAGNOSIS — R0789 Other chest pain: Secondary | ICD-10-CM

## 2016-02-16 DIAGNOSIS — Z79899 Other long term (current) drug therapy: Secondary | ICD-10-CM | POA: Insufficient documentation

## 2016-02-16 DIAGNOSIS — E039 Hypothyroidism, unspecified: Secondary | ICD-10-CM | POA: Diagnosis not present

## 2016-02-16 LAB — COMPREHENSIVE METABOLIC PANEL
ALT: 26 U/L (ref 14–54)
AST: 24 U/L (ref 15–41)
Albumin: 4.1 g/dL (ref 3.5–5.0)
Alkaline Phosphatase: 76 U/L (ref 38–126)
Anion gap: 10 (ref 5–15)
BILIRUBIN TOTAL: 0.3 mg/dL (ref 0.3–1.2)
BUN: 14 mg/dL (ref 6–20)
CO2: 22 mmol/L (ref 22–32)
CREATININE: 1.23 mg/dL — AB (ref 0.44–1.00)
Calcium: 9.4 mg/dL (ref 8.9–10.3)
Chloride: 106 mmol/L (ref 101–111)
GFR, EST NON AFRICAN AMERICAN: 53 mL/min — AB (ref >60)
Glucose, Bld: 117 mg/dL — ABNORMAL HIGH (ref 65–99)
POTASSIUM: 3.6 mmol/L (ref 3.5–5.1)
Sodium: 138 mmol/L (ref 135–145)
TOTAL PROTEIN: 7.5 g/dL (ref 6.5–8.1)

## 2016-02-16 LAB — CBC
HEMATOCRIT: 44.9 % (ref 36.0–46.0)
Hemoglobin: 14.8 g/dL (ref 12.0–15.0)
MCH: 29.1 pg (ref 26.0–34.0)
MCHC: 33 g/dL (ref 30.0–36.0)
MCV: 88.4 fL (ref 78.0–100.0)
PLATELETS: 345 10*3/uL (ref 150–400)
RBC: 5.08 MIL/uL (ref 3.87–5.11)
RDW: 13.4 % (ref 11.5–15.5)
WBC: 8.4 10*3/uL (ref 4.0–10.5)

## 2016-02-16 LAB — BRAIN NATRIURETIC PEPTIDE: B Natriuretic Peptide: 9.8 pg/mL (ref 0.0–100.0)

## 2016-02-16 LAB — TROPONIN I

## 2016-02-16 MED ORDER — IPRATROPIUM-ALBUTEROL 0.5-2.5 (3) MG/3ML IN SOLN
3.0000 mL | RESPIRATORY_TRACT | Status: DC
  Administered 2016-02-16: 3 mL via RESPIRATORY_TRACT
  Filled 2016-02-16: qty 3

## 2016-02-16 NOTE — ED Notes (Signed)
Portable CXR done.

## 2016-02-16 NOTE — ED Notes (Signed)
States was laying in bed and started having right sided chest pain with SOB. Pain worse with deep breath and reproduciible

## 2016-02-16 NOTE — ED Provider Notes (Signed)
[facility] Provider Note: Lindsey Nicholson Lindsey Cowles, MD, FACEP  CSN: [id number] MRN: [id number] ARRIVAL: 02/16/16 at 2148 ROOM: [facility]10/[facility]10  By signing my name below, I, Lindsey Nicholson, attest that this documentation has been prepared under the direction and in the presence of Paula LibraJohn Berdene Askari, MD . Electronically Signed: Modena JanskyAlbert Nicholson, Scribe. 02/16/2016. 11:05 PM.   CHIEF COMPLAINT  Chest Pain   HISTORY OF PRESENT ILLNESS  [name] is a 45 y.o. female complaining of constant moderate right-sided chest pain that started a few hours ago. She had a sudden onset of pain while at rest that is now gradually improving. She describes the pain as a non-radiating, tight sensation on the right side of her chest exacerbated by deep breathing. She reports associated SOB, feeling hot all day, nausea, and one episode of vomiting. She has a history of bronchitis in the past requiring neb treatments. She states she has had no exacerbations since quitting smoking.    Past Medical History:  Diagnosis Date  . Anxiety   . Bipolar 1 disorder (HCC)   . Borderline personality disorder   . Cervical dysplasia   . GERD (gastroesophageal reflux disease)   . History of vulvar dysplasia    vin I  --- s/p  wide excision 06-21-2012   . Hypothyroidism   . Moderate major depression (HCC)   . NAFL (nonalcoholic fatty liver)   . Premature ovarian failure   . Wears glasses     Past Surgical History:  Procedure Laterality Date  . CARPAL TUNNEL RELEASE Bilateral LEFT  2013//  RIGHT  JAN  2015  . CHOLECYSTECTOMY  2011  . HYSTEROSCOPY W/D&C  10/17/2010   Procedure: DILATATION AND CURETTAGE (D&C) /HYSTEROSCOPY;  Surgeon: Jessee Aversara J. Cole;  Location: [facility];  Service: Gynecology;  Laterality: N/A;  . HYSTEROSCOPY W/D&C N/A 06/21/2012   Procedure: DILATATION AND CURETTAGE /HYSTEROSCOPY;  Surgeon: Meriel Picaichard M Holland, MD;  Location: [facility];  Service: Gynecology;  Laterality: N/A;  . LAPAROSCOPIC TUBAL LIGATION   01/09/2011   Procedure: LAPAROSCOPIC TUBAL LIGATION;  Surgeon: Meriel Picaichard M Holland;  Location: [facility];  Service: Gynecology;  Laterality: Bilateral;  with Filshie clips  . LEEP  01/09/2011   Procedure: LOOP ELECTROSURGICAL EXCISION PROCEDURE (LEEP);  Surgeon: Meriel Picaichard M Holland;  Location: [facility];  Service: Gynecology;  Laterality: N/A;  . LEEP N/A 09/16/2013   Procedure: LOOP ELECTROSURGICAL EXCISION PROCEDURE (LEEP);  Surgeon: Meriel Picaichard M Holland, MD;  [facility];  Service: Gynecology;  Laterality: N/A;  . NEGATIVE SLEEP STUDY  2014   per pt  . ORIF LEFT FOOT FX  2008   HARDWARE  REMOVED 6 MONTHS LATER  . VULVECTOMY N/A 06/21/2012   Procedure: WIDE EXCISION VULVECTOMY;  Surgeon: Meriel Picaichard M Holland, MD;  Location: [facility];  Service: Gynecology;  Laterality: N/A;  excision of vulvar lesions,  need colposcope    Family History  Problem Relation Age of Onset  . Cancer Mother   . Mental illness Mother   . Alcohol abuse Mother   . Drug abuse Mother   . Bipolar disorder Sister   . Alcohol abuse Maternal Aunt   . Drug abuse Maternal Aunt   . Depression Maternal Aunt   . Schizophrenia Maternal Aunt   . Alcohol abuse Maternal Uncle   . Alcohol abuse Maternal Grandfather   . Diabetes Father     Social History  Substance Use Topics  . Smoking status: Former Smoker    Packs/day: 0.50  Years: 25.00    Types: Cigarettes    Quit date: 08/16/2015  . Smokeless tobacco: Never Used  . Alcohol use No    Prior to Admission medications   Medication Sig Start Date End Date Taking? Authorizing Provider  amantadine (SYMMETREL) 100 MG capsule Take 100 mg by mouth 2 (two) times daily.   Yes Historical Provider, MD  buPROPion (WELLBUTRIN XL) 150 MG 24 hr tablet Take 150 mg by mouth every morning.   Yes Historical Provider, MD  buPROPion (WELLBUTRIN XL) 300 MG 24 hr tablet Take 300 mg by mouth every morning.    Yes Historical Provider, MD  DULoxetine (CYMBALTA) 60 MG capsule Take  120 mg by mouth every morning.   Yes Historical Provider, MD  hydrOXYzine (ATARAX/VISTARIL) 25 MG tablet Take 25 mg by mouth 3 (three) times daily as needed.   Yes Historical Provider, MD  levothyroxine (SYNTHROID, LEVOTHROID) 150 MCG tablet Take 150 mcg by mouth daily before breakfast.   Yes Historical Provider, MD  zolpidem (AMBIEN) 10 MG tablet Take 10 mg by mouth at bedtime as needed for sleep.   Yes Historical Provider, MD  famotidine (PEPCID) 20 MG tablet Take 1 tablet (20 mg total) by mouth 2 (two) times daily. 01/15/16   Pricilla LovelessScott Goldston, MD  ibuprofen (ADVIL,MOTRIN) 800 MG tablet Take 1 tablet (800 mg total) by mouth 3 (three) times daily. 02/13/14   Heather Laisure, PA-C  pantoprazole (PROTONIX) 40 MG tablet Take 1 tablet (40 mg total) by mouth 2 (two) times daily. 01/15/16   Pricilla LovelessScott Goldston, MD    Allergies Codeine; Hydrocodone; Oxycodone; and Trazodone and nefazodone   REVIEW OF SYSTEMS  Negative except as noted here or in the History of Present Illness.   PHYSICAL EXAMINATION  Initial Vital Signs Blood pressure 114/71, temperature 98.3 F (36.8 C), temperature source Oral, resp. rate 20, SpO2 100 %.  Examination General: Well-developed, obese female in no acute distress; appearance consistent with age of record HENT: normocephalic; atraumatic Eyes: pupils equal, round and reactive to light; extraocular muscles intact Neck: supple Heart: regular rate and rhythm; no murmur Lungs: clear to auscultation bilaterally; difficulty taking a deep breath Chest: Right parasternal chest wall tenderness which reproduces the pain of chief complaint Abdomen: soft; obese; nontender; bowel sounds present Extremities: No deformity; full range of motion; pulses normal Neurologic: Awake, alert and oriented; motor function intact in all extremities and symmetric; no facial droop Skin: Warm and dry Psychiatric: Normal mood and affect   RESULTS  Summary of this visit's results, reviewed by  myself:   EKG Interpretation  Date/Time:  Saturday February 16 2016 22:00:55 EST Ventricular Rate:  84 PR Interval:    QRS Duration: 96 QT Interval:  368 QTC Calculation: 435 R Axis:   36 Text Interpretation:  Sinus rhythm since last tracing no significant change Confirmed by BELFI  MD, MELANIE ([id number]) on 02/16/2016 10:15:01 PM      Laboratory Studies: Results for orders placed or performed during the hospital encounter of 02/16/16 (from the past 24 hour(s))  Comprehensive metabolic panel     Status: Abnormal   Collection Time: 02/16/16 10:17 PM  Result Value Ref Range   Sodium 138 135 - 145 mmol/L   Potassium 3.6 3.5 - 5.1 mmol/L   Chloride 106 101 - 111 mmol/L   CO2 22 22 - 32 mmol/L   Glucose, Bld 117 (H) 65 - 99 mg/dL   BUN 14 6 - 20 mg/dL   Creatinine, Ser 1.611.23 (H) 0.44 -  1.00 mg/dL   Calcium 9.4 8.9 - 16.1 mg/dL   Total Protein 7.5 6.5 - 8.1 g/dL   Albumin 4.1 3.5 - 5.0 g/dL   AST 24 15 - 41 U/L   ALT 26 14 - 54 U/L   Alkaline Phosphatase 76 38 - 126 U/L   Total Bilirubin 0.3 0.3 - 1.2 mg/dL   GFR calc non Af Amer 53 (L) >60 mL/min   GFR calc Af Amer >60 >60 mL/min   Anion gap 10 5 - 15  CBC     Status: None   Collection Time: 02/16/16 10:17 PM  Result Value Ref Range   WBC 8.4 4.0 - 10.5 K/uL   RBC 5.08 3.87 - 5.11 MIL/uL   Hemoglobin 14.8 12.0 - 15.0 g/dL   HCT 09.6 04.5 - 40.9 %   MCV 88.4 78.0 - 100.0 fL   MCH 29.1 26.0 - 34.0 pg   MCHC 33.0 30.0 - 36.0 g/dL   RDW 81.1 91.4 - 78.2 %   Platelets 345 150 - 400 K/uL  Brain natriuretic peptide (order if patient c/o SOB ONLY)     Status: None   Collection Time: 02/16/16 10:17 PM  Result Value Ref Range   B Natriuretic Peptide 9.8 0.0 - 100.0 pg/mL  Troponin I     Status: None   Collection Time: 02/16/16 10:17 PM  Result Value Ref Range   Troponin I <0.03 <0.03 ng/mL   Imaging Studies: Dg Chest Portable 1 View  Result Date: 02/16/2016 CLINICAL DATA:  Right-sided chest pain. EXAM: PORTABLE CHEST 1 VIEW  COMPARISON:  January 15, 2016 FINDINGS: The heart size and mediastinal contours are within normal limits. Both lungs are clear. The visualized skeletal structures are unremarkable. IMPRESSION: No active disease. Electronically Signed   By: Gerome Sam III M.D   On: 02/16/2016 22:53    ED COURSE  Nursing notes and initial vitals signs, including pulse oximetry, reviewed.  Vitals:   02/16/16 2220 02/16/16 2339  BP: 114/71   Resp: 20   Temp: 98.3 F (36.8 C)   TempSrc: Oral   SpO2: 100% 100%   12:24 AM Patient's shortness of breath improved after DuoNeb treatment.  PROCEDURES    ED DIAGNOSES     ICD-9-CM ICD-10-CM   1. Chest wall pain 786.52 R07.89   2. Acute bronchospasm 519.11 J98.01    I personally performed the services described in this documentation, which was scribed in my presence. The recorded information has been reviewed and is accurate.     Paula Libra, MD 02/17/16 [phone]

## 2016-02-16 NOTE — ED Triage Notes (Signed)
PT c/o sudden onset right side chest pain and SOB, plus nausea that started at approx 2130 tonight.

## 2016-02-16 NOTE — ED Notes (Signed)
Attempted IV x 2, to bil Ac, tol well

## 2016-02-17 MED ORDER — NAPROXEN 250 MG PO TABS
500.0000 mg | ORAL_TABLET | Freq: Once | ORAL | Status: AC
  Administered 2016-02-17: 500 mg via ORAL
  Filled 2016-02-17: qty 2

## 2016-02-17 MED ORDER — ALBUTEROL SULFATE HFA 108 (90 BASE) MCG/ACT IN AERS
2.0000 | INHALATION_SPRAY | RESPIRATORY_TRACT | Status: DC | PRN
  Administered 2016-02-17: 2 via RESPIRATORY_TRACT
  Filled 2016-02-17 (×2): qty 6.7

## 2016-06-16 ENCOUNTER — Other Ambulatory Visit: Payer: Self-pay | Admitting: Obstetrics and Gynecology

## 2016-06-16 DIAGNOSIS — N644 Mastodynia: Secondary | ICD-10-CM

## 2016-06-18 ENCOUNTER — Ambulatory Visit
Admission: RE | Admit: 2016-06-18 | Discharge: 2016-06-18 | Disposition: A | Discharge status: Home / Self Care | Payer: Medicare Other | Source: Ambulatory Visit | Attending: Obstetrics and Gynecology | Admitting: Obstetrics and Gynecology

## 2016-06-18 DIAGNOSIS — N644 Mastodynia: Secondary | ICD-10-CM

## 2016-06-30 ENCOUNTER — Other Ambulatory Visit: Payer: Self-pay | Admitting: Obstetrics and Gynecology

## 2016-06-30 DIAGNOSIS — N644 Mastodynia: Secondary | ICD-10-CM

## 2016-07-01 ENCOUNTER — Encounter (HOSPITAL_BASED_OUTPATIENT_CLINIC_OR_DEPARTMENT_OTHER): Payer: Self-pay

## 2016-07-01 ENCOUNTER — Emergency Department (HOSPITAL_BASED_OUTPATIENT_CLINIC_OR_DEPARTMENT_OTHER)
Admission: EM | Admit: 2016-07-01 | Discharge: 2016-07-02 | Disposition: A | Discharge status: Home / Self Care | Payer: Medicare Other | Attending: Emergency Medicine | Admitting: Emergency Medicine

## 2016-07-01 DIAGNOSIS — R609 Edema, unspecified: Secondary | ICD-10-CM

## 2016-07-01 DIAGNOSIS — Z87891 Personal history of nicotine dependence: Secondary | ICD-10-CM | POA: Insufficient documentation

## 2016-07-01 DIAGNOSIS — R600 Localized edema: Secondary | ICD-10-CM | POA: Diagnosis not present

## 2016-07-01 DIAGNOSIS — Z79899 Other long term (current) drug therapy: Secondary | ICD-10-CM | POA: Diagnosis not present

## 2016-07-01 DIAGNOSIS — E039 Hypothyroidism, unspecified: Secondary | ICD-10-CM | POA: Diagnosis not present

## 2016-07-01 DIAGNOSIS — M7989 Other specified soft tissue disorders: Secondary | ICD-10-CM | POA: Diagnosis present

## 2016-07-01 MED ORDER — TRAMADOL HCL 50 MG PO TABS
50.0000 mg | ORAL_TABLET | Freq: Once | ORAL | Status: AC
  Administered 2016-07-02: 50 mg via ORAL
  Filled 2016-07-01: qty 1

## 2016-07-01 NOTE — ED Triage Notes (Addendum)
Pt entered triage talking on phone-NAD-had to be asked to end phone call to start triage process-c/o bilat leg swelling since Dec 2017-seen by PCP x 3-states on med-no relief-NAD-presents to triage in w/c

## 2016-07-01 NOTE — ED Notes (Signed)
Pt with bilat lower extremity swelling since Dec. Has seen her PCP multiple times and has had [place] to R/U DVT. Denies SHOB,CP, recent travel, or estrogen BC.

## 2016-07-01 NOTE — ED Provider Notes (Addendum)
By signing my name below, I, Nelwyn Salisbury, attest that this documentation has been prepared under the direction and in the presence of Kristen N Ward, DO . Electronically Signed: Nelwyn Salisbury, Scribe. 07/01/2016. 11:08 PM.  TIME SEEN: 11:37 PM  CHIEF COMPLAINT: Leg Swelling  HPI:  Lindsey Nicholson is a 46 y.o. female with pmhx of GERD and hypothyroidism who presents to the Emergency Department complaining of constant, unchanged bilateral leg swelling onset 4 months - started December 22 after a flight. She states that her left leg is worse than her right and describes a sensation that her leg is going to pop. Her symptoms are alleviated by laying down and keeping her legs elevated and worsened by standing up or sitting for prolonged period of time. She reports associated pain to her legs. She states she has had an outpatient echocardiogram, bilateral venous Dopplers that have been normal by her PCP Dr. Vida Roller at [facility].  She had an abdominal ultrasound today but states she does not know the results. She denies any fevers.  No rash. No injury to the legs. Has been able to ambulate. No numbness or focal weakness. She denies chest pain or shortness of breath. No history of PE or DVT.  ROS: See HPI Constitutional: no fever  Eyes: no drainage  ENT: no runny nose   Cardiovascular:  no chest pain  Resp: no SOB  GI: no vomiting GU: no dysuria Integumentary: no rash  Allergy: no hives  Musculoskeletal: Bilateral lower extremity swelling and pain Neurological: no slurred speech ROS otherwise negative  PAST MEDICAL HISTORY/PAST SURGICAL HISTORY:  Past Medical History:  Diagnosis Date  . Anxiety   . Bipolar 1 disorder (HCC)   . Borderline personality disorder   . Cervical dysplasia   . GERD (gastroesophageal reflux disease)   . History of vulvar dysplasia    vin I  --- s/p  wide excision 06-21-2012   . Hypothyroidism   . Moderate major depression (HCC)   . NAFL  (nonalcoholic fatty liver)   . Premature ovarian failure   . Wears glasses     MEDICATIONS:  Prior to Admission medications   Medication Sig Start Date End Date Taking? Authorizing Provider  divalproex (DEPAKOTE ER) 250 MG 24 hr tablet Take 250 mg by mouth daily. Take two tablets at bedtime   Yes Historical Provider, MD  hydrochlorothiazide (HYDRODIURIL) 25 MG tablet Take 25 mg by mouth daily.   Yes Historical Provider, MD  hydrOXYzine (ATARAX/VISTARIL) 10 MG tablet Take 10 mg by mouth 2 times daily at 12 noon and 4 pm.   Yes Historical Provider, MD  phentermine 37.5 MG capsule Take 37.5 mg by mouth every morning.   Yes Historical Provider, MD  amantadine (SYMMETREL) 100 MG capsule Take 100 mg by mouth 2 (two) times daily.    Historical Provider, MD  buPROPion (WELLBUTRIN XL) 150 MG 24 hr tablet Take 150 mg by mouth every morning.    Historical Provider, MD  buPROPion (WELLBUTRIN XL) 300 MG 24 hr tablet Take 300 mg by mouth every morning.     Historical Provider, MD  DULoxetine (CYMBALTA) 60 MG capsule Take 60 mg by mouth daily.     Historical Provider, MD  levothyroxine (SYNTHROID, LEVOTHROID) 150 MCG tablet Take 150 mcg by mouth daily before breakfast. Take two tablets before breakfast    Historical Provider, MD  zolpidem (AMBIEN) 10 MG tablet Take 10 mg by mouth at bedtime as needed for sleep.  Historical Provider, MD    ALLERGIES:  Allergies  Allergen Reactions  . Codeine Anaphylaxis  . Hydrocodone Anaphylaxis  . Oxycodone Anaphylaxis  . Trazodone And Nefazodone Other (See Comments)    Blurry vision    SOCIAL HISTORY:  Social History  Substance Use Topics  . Smoking status: Former Smoker    Packs/day: 0.50    Years: 25.00    Types: Cigarettes    Quit date: 08/16/2015  . Smokeless tobacco: Never Used  . Alcohol use No    FAMILY HISTORY: Family History  Problem Relation Age of Onset  . Cancer Mother   . Mental illness Mother   . Alcohol abuse Mother   . Drug abuse  Mother   . Bipolar disorder Sister   . Alcohol abuse Maternal Aunt   . Drug abuse Maternal Aunt   . Depression Maternal Aunt   . Schizophrenia Maternal Aunt   . Alcohol abuse Maternal Uncle   . Alcohol abuse Maternal Grandfather   . Diabetes Father     EXAM: BP 139/87 (BP Location: Right Arm)   Pulse (!) 120   Temp 98.1 F (36.7 C) (Oral)   Resp 20   Wt (!) 314 lb 7 oz (142.6 kg)   SpO2 98%   BMI 53.97 kg/m  CONSTITUTIONAL: Alert and oriented and responds appropriately to questions. Well-appearing; well-nourished, Obese, afebrile, appears anxious HEAD: Normocephalic EYES: Conjunctivae clear, pupils appear equal, EOMI ENT: normal nose; moist mucous membranes NECK: Supple, no meningismus, no nuchal rigidity, no LAD, no JVD  CARD: RRR; S1 and S2 appreciated; no murmurs, no clicks, no rubs, no gallops RESP: Normal chest excursion without splinting or tachypnea; breath sounds clear and equal bilaterally; no wheezes, no rhonchi, no rales, no hypoxia or respiratory distress, speaking full sentences ABD/GI: Normal bowel sounds; non-distended; soft, non-tender, no rebound, no guarding, no peritoneal signs, no hepatosplenomegaly BACK:  The back appears normal and is non-tender to palpation, there is no CVA tenderness EXT: Normal ROM in all joints; non-tender to palpation; normal capillary refill; no cyanosis, no calf tenderness or swelling; extremities are warm and well-perfused, 2+ strong palpable DP pulses in both feet, compartments are soft, normal sensation throughout the legs, no pitting edema, very minimal swelling noted to the dorsal feet bilaterally SKIN: Normal color for age and race; warm; no rash NEURO: Moves all extremities equally PSYCH: The patient's mood and manner are appropriate. Grooming and personal hygiene are appropriate.  MEDICAL DECISION MAKING: Patient here with complaints of peripheral edema. Suspect there is a component of venous insufficiency. She has had bilateral  venous Dopplers ruling out DVT. Nothing on exam to suggest arterial obstruction. No sign of cellulitis, gout, compartment syndrome, septic arthritis. No injury to suggest fracture. Neurovascular intact distally. She reports swelling improves when her legs are elevated. Recommend she keep her legs elevated when at rest, decrease her sodium intake and wear compression hose. Will give tramadol for pain and short prescription for the same. Have recommended close follow-up with her primary care provider. Initially patient was very tachycardic when she came to the emergency department but suspect this was secondary to anxiety. This has improved without intervention. She denies any chest pain or shortness of breath. Otherwise does not appear volume overloaded.   At this time, I do not feel there is any life-threatening condition present. I have reviewed and discussed all results (EKG, imaging, lab, urine as appropriate) and exam findings with patient/family. I have reviewed nursing notes and appropriate previous records.  I feel the patient is safe to be discharged home without further emergent workup and can continue workup as an outpatient as needed. Discussed usual and customary return precautions. Patient/family verbalize understanding and are comfortable with this plan.  Outpatient follow-up has been provided if needed. All questions have been answered.  I personally performed the services described in this documentation, which was scribed in my presence. The recorded information has been reviewed and is accurate.     Layla Maw Ward, DO 07/02/16 0020   12:50 AM  No reaction after receiving tramadol. Patient safe to be discharged home.   Layla Maw Ward, DO 07/02/16 [phone]

## 2016-07-02 MED ORDER — TRAMADOL HCL 50 MG PO TABS: 50.0000 mg | ORAL_TABLET | Freq: Three times a day (TID) | ORAL | 0 refills | Status: DC | PRN

## 2016-07-02 NOTE — Discharge Instructions (Signed)
Please keep her legs elevated when at rest. I recommend compression stockings when you are out and about, walking around.  [employer] is a good brand that can be purchased off [employer].

## 2016-07-10 ENCOUNTER — Other Ambulatory Visit: Payer: Self-pay

## 2016-07-12 ENCOUNTER — Emergency Department (HOSPITAL_BASED_OUTPATIENT_CLINIC_OR_DEPARTMENT_OTHER)
Admission: EM | Admit: 2016-07-12 | Discharge: 2016-07-12 | Disposition: A | Discharge status: Home / Self Care | Payer: Medicare Other | Attending: Emergency Medicine | Admitting: Emergency Medicine

## 2016-07-12 ENCOUNTER — Encounter (HOSPITAL_BASED_OUTPATIENT_CLINIC_OR_DEPARTMENT_OTHER): Payer: Self-pay | Admitting: Emergency Medicine

## 2016-07-12 DIAGNOSIS — Z79899 Other long term (current) drug therapy: Secondary | ICD-10-CM | POA: Diagnosis not present

## 2016-07-12 DIAGNOSIS — Z87891 Personal history of nicotine dependence: Secondary | ICD-10-CM | POA: Diagnosis not present

## 2016-07-12 DIAGNOSIS — L03317 Cellulitis of buttock: Secondary | ICD-10-CM | POA: Insufficient documentation

## 2016-07-12 DIAGNOSIS — N764 Abscess of vulva: Secondary | ICD-10-CM | POA: Insufficient documentation

## 2016-07-12 DIAGNOSIS — E039 Hypothyroidism, unspecified: Secondary | ICD-10-CM | POA: Diagnosis not present

## 2016-07-12 MED ORDER — SULFAMETHOXAZOLE-TRIMETHOPRIM 800-160 MG PO TABS
1.0000 | ORAL_TABLET | Freq: Once | ORAL | Status: AC
  Administered 2016-07-12: 1 via ORAL
  Filled 2016-07-12: qty 1

## 2016-07-12 MED ORDER — LIDOCAINE-EPINEPHRINE (PF) 2 %-1:200000 IJ SOLN
20.0000 mL | Freq: Once | INTRAMUSCULAR | Status: AC
  Administered 2016-07-12: 20 mL
  Filled 2016-07-12: qty 20

## 2016-07-12 MED ORDER — NAPROXEN 500 MG PO TABS: 500.0000 mg | ORAL_TABLET | Freq: Two times a day (BID) | ORAL | 0 refills | Status: DC

## 2016-07-12 MED ORDER — SULFAMETHOXAZOLE-TRIMETHOPRIM 800-160 MG PO TABS: 1.0000 | ORAL_TABLET | Freq: Two times a day (BID) | ORAL | 0 refills | Status: AC

## 2016-07-12 NOTE — ED Notes (Signed)
Assisted PA with I&D

## 2016-07-12 NOTE — ED Notes (Addendum)
Alert, NAD, calm, interactive, resps e/u, speaking in clear complete sentences, no dyspnea noted, skin W&D, c/o R perineal abscess w/o drainage, hard, deep, area indurated w/o obvious redness, (denies: sob, vomiting, cramping, fever, dizziness, or urinary or vaginal sx), first noticed this am, h/o similar, "tried to lance and drain it with a home sterilized needle w/o success", no meds PTA. EDP into room. Family at [facility].

## 2016-07-12 NOTE — ED Notes (Signed)
EDPA at BS for I&D. 

## 2016-07-12 NOTE — ED Triage Notes (Signed)
PT presents to ED with c/o abscess that appeared today. Pt sts she has a history the same but was unable to pop this one due to location. Pt sts it on upper inner thigh/crease of upper thigh.

## 2016-07-12 NOTE — ED Notes (Signed)
Pt and SO given d/c instructions as per chart. Rx x 2. Verbalizes understanding. No questions. 

## 2016-07-12 NOTE — ED Provider Notes (Signed)
[facility] Provider Note   CSN: [id number] Arrival date & time: 07/12/16  2022  By signing my name below, I, Doreatha Martin, attest that this documentation has been prepared under the direction and in the presence of Everlene Farrier, PA-C. Electronically Signed: Doreatha Martin, ED Scribe. 07/12/16. 10:35 PM.    History   Chief Complaint Chief Complaint  Patient presents with  . Abscess    HPI Lindsey Nicholson is a 46 y.o. female who presents to the Emergency Department complaining of a moderate, gradually worsening area of pain and swelling to the right buttock onset this morning. Pt reports frequent h/o similar abscesses, one of which required lancing. Pt states pain is worsened with palpation and direct pressure. She notes her pain is unaffected with bowel movements. She denies fever, chills, rectal pain, drainage from the area.    The history is provided by the patient. No language interpreter was used.    Past Medical History:  Diagnosis Date  . Anxiety   . Bipolar 1 disorder (HCC)   . Borderline personality disorder   . Cervical dysplasia   . GERD (gastroesophageal reflux disease)   . History of vulvar dysplasia    vin I  --- s/p  wide excision 06-21-2012   . Hypothyroidism   . Moderate major depression (HCC)   . NAFL (nonalcoholic fatty liver)   . Premature ovarian failure   . Wears glasses     Patient Active Problem List   Diagnosis Date Noted  . 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 Bipolar 1 disorder, depressed (HCC) 04/16/2012  . Depression, major, recurrent, moderate (HCC) 04/01/2012    Past Surgical History:  Procedure Laterality Date  . CARPAL TUNNEL RELEASE Bilateral LEFT  2013//  RIGHT  JAN  2015  . CHOLECYSTECTOMY  2011  . HYSTEROSCOPY W/D&C  10/17/2010   Procedure: DILATATION AND CURETTAGE (D&C) /HYSTEROSCOPY;  Surgeon: Jessee Avers;  Location: [facility];  Service: Gynecology;  Laterality: N/A;  . HYSTEROSCOPY W/D&C N/A 06/21/2012   Procedure:  DILATATION AND CURETTAGE /HYSTEROSCOPY;  Surgeon: [name], MD;  Location: [facility];  Service: Gynecology;  Laterality: N/A;  . LAPAROSCOPIC TUBAL LIGATION  01/09/2011   Procedure: LAPAROSCOPIC TUBAL LIGATION;  Surgeon: [name];  Location: [facility];  Service: Gynecology;  Laterality: Bilateral;  with [place] clips  . LEEP  01/09/2011   Procedure: LOOP ELECTROSURGICAL EXCISION PROCEDURE (LEEP);  Surgeon: [name];  Location: [facility];  Service: Gynecology;  Laterality: N/A;  . LEEP N/A 09/16/2013   Procedure: LOOP ELECTROSURGICAL EXCISION PROCEDURE (LEEP);  Surgeon: [name], MD;  Location: [facility];  Service: Gynecology;  Laterality: N/A;  . NEGATIVE SLEEP STUDY  2014   per pt  . ORIF LEFT FOOT FX  2008   HARDWARE  REMOVED 6 MONTHS LATER  . VULVECTOMY N/A 06/21/2012   Procedure: WIDE EXCISION VULVECTOMY;  Surgeon: [name], MD;  Location: [facility];  Service: Gynecology;  Laterality: N/A;  excision of vulvar lesions,  need colposcope    OB History    Gravida Para Term Preterm AB Living   0             SAB TAB Ectopic Multiple Live Births                   Home Medications    Prior to Admission medications   Medication Sig Start Date End Date Taking? Authorizing Provider  amantadine (SYMMETREL) 100 MG capsule  Take 100 mg by mouth 2 (two) times daily.    Historical Provider, MD  buPROPion (WELLBUTRIN XL) 150 MG 24 hr tablet Take 150 mg by mouth every morning.    Historical Provider, MD  buPROPion (WELLBUTRIN XL) 300 MG 24 hr tablet Take 300 mg by mouth every morning.     Historical Provider, MD  divalproex (DEPAKOTE ER) 250 MG 24 hr tablet Take 250 mg by mouth daily. Take two tablets at bedtime    Historical Provider, MD  DULoxetine (CYMBALTA) 60 MG capsule Take 60 mg by mouth daily.     Historical Provider, MD  hydrochlorothiazide (HYDRODIURIL) 25 MG tablet Take 25 mg by mouth daily.    Historical Provider, MD  hydrOXYzine  (ATARAX/VISTARIL) 10 MG tablet Take 10 mg by mouth 2 times daily at 12 noon and 4 pm.    Historical Provider, MD  levothyroxine (SYNTHROID, LEVOTHROID) 150 MCG tablet Take 150 mcg by mouth daily before breakfast. Take two tablets before breakfast    Historical Provider, MD  naproxen (NAPROSYN) 500 MG tablet Take 1 tablet (500 mg total) by mouth 2 (two) times daily with a meal. 07/12/16   Everlene Farrier, PA-C  phentermine 37.5 MG capsule Take 37.5 mg by mouth every morning.    Historical Provider, MD  sulfamethoxazole-trimethoprim (BACTRIM DS,SEPTRA DS) 800-160 MG tablet Take 1 tablet by mouth 2 (two) times daily. 07/12/16 07/19/16  Everlene Farrier, PA-C  traMADol (ULTRAM) 50 MG tablet Take 1 tablet (50 mg total) by mouth every 8 (eight) hours as needed. 07/02/16   Kristen N Ward, DO  zolpidem (AMBIEN) 10 MG tablet Take 10 mg by mouth at bedtime as needed for sleep.    Historical Provider, MD    Family History Family History  Problem Relation Age of Onset  . Cancer Mother   . Mental illness Mother   . Alcohol abuse Mother   . Drug abuse Mother   . Bipolar disorder Sister   . Alcohol abuse Maternal Aunt   . Drug abuse Maternal Aunt   . Depression Maternal Aunt   . Schizophrenia Maternal Aunt   . Alcohol abuse Maternal Uncle   . Alcohol abuse Maternal Grandfather   . Diabetes Father     Social History Social History  Substance Use Topics  . Smoking status: Former Smoker    Packs/day: 0.50    Years: 25.00    Types: Cigarettes    Quit date: 08/16/2015  . Smokeless tobacco: Never Used  . Alcohol use No     Allergies   Codeine; Hydrocodone; Oxycodone; and Trazodone and nefazodone   Review of Systems Review of Systems  Constitutional: Negative for chills and fever.  Gastrointestinal: Negative for abdominal pain, rectal pain and vomiting.  Genitourinary: Negative for dysuria, genital sores and vaginal pain.       +area of pain and swelling to right buttock  Skin: Positive for wound.      Physical Exam Updated Vital Signs BP 112/84 (BP Location: Right Arm)   Pulse 94   Temp 98.4 F (36.9 C) (Oral)   Resp 20   Ht  (1.6 m)   Wt (!) 143.3 kg   SpO2 100%   BMI 55.98 kg/m   Physical Exam  Constitutional: She appears well-developed and well-nourished. No distress.  Pt is non-toxic in appearance.   HENT:  Head: Normocephalic and atraumatic.  Eyes: Right eye exhibits no discharge. Left eye exhibits no discharge.  Cardiovascular: Normal rate and intact distal pulses.  Pulmonary/Chest: Effort normal. No respiratory distress.  Abdominal: Soft. There is no tenderness.  Genitourinary:  Genitourinary Comments: 4 cm area of induration to the right buttock near perineum and vagina. It does not extend near the rectum.  No fluctuance or discharge. Chaperone present throughout entire exam.    Neurological: She is alert. Coordination normal.  Skin: Skin is warm and dry. Capillary refill takes less than 2 seconds. She is not diaphoretic. There is erythema. No pallor.  Psychiatric: She has a normal mood and affect. Her behavior is normal.  Nursing note and vitals reviewed.    ED Treatments / Results   DIAGNOSTIC STUDIES: Oxygen Saturation is 100% on RA, normal by my interpretation.    COORDINATION OF CARE: 10:30 PM Discussed treatment plan with pt at bedside which includes bedside US, I&D and pt agreed to plan.    Procedures .Marland KitchenIncision and Drainage Date/Time: 07/12/2016 11:13 PM Performed by: Everlene Farrier Authorized by: Everlene Farrier   Consent:    Consent obtained:  Verbal   Consent given by:  Patient   Risks discussed:  Bleeding, incomplete drainage and infection   Alternatives discussed:  No treatment and alternative treatment Universal protocol:    Procedure explained and questions answered to patient or proxy's satisfaction: yes     Immediately prior to procedure a time out was called: yes     Patient identity confirmed:  Verbally with patient and  arm band Location:    Type:  Abscess   Size:  4 cm   Location:  Anogenital   Anogenital location:  Perineum Pre-procedure details:    Skin preparation:  Betadine Anesthesia (see MAR for exact dosages):    Anesthesia method:  Local infiltration   Local anesthetic:  Lidocaine 2% WITH epi Procedure type:    Complexity:  Simple Procedure details:    Needle aspiration: yes     Needle size:  18 G   Drainage:  Bloody (scant)   Drainage amount:  Scant   Wound treatment:  Wound left open   Packing materials:  None Post-procedure details:    Patient tolerance of procedure:  Tolerated well, no immediate complications      (including critical care time)  EMERGENCY DEPARTMENT US SOFT TISSUE INTERPRETATION "Study: Limited Soft Tissue Ultrasound"  INDICATIONS: Soft tissue infection Multiple views of the body part were obtained in real-time with a multi-frequency linear probe PERFORMED BY:  Myself IMAGES ARCHIVED?: Yes SIDE:Right  BODY PART:buttock FINDINGS: Abcess present and Cellulitis present INTERPRETATION:  Small area of fluid collection with surrounding cellulitis.      Medications Ordered in ED Medications  lidocaine-EPINEPHrine (XYLOCAINE W/EPI) 2 %-1:200000 (PF) injection 20 mL (20 mLs Infiltration Given 07/12/16 2354)  sulfamethoxazole-trimethoprim (BACTRIM DS,SEPTRA DS) 800-160 MG per tablet 1 tablet (1 tablet Oral Given 07/12/16 2353)     Initial Impression / Assessment and Plan / ED Course  I have reviewed the triage vital signs and the nursing notes.    This is a 46 y.o. female who presents to the Emergency Department complaining of a moderate, gradually worsening area of pain and swelling to the right buttock onset this morning.  On exam the patient is afebrile nontoxic appearing. She is about a 4 cm area of induration to her right lower buttocks that is near perineum. It does not extend into her rectum. I am unable to palpate any area of fluctuance. There is no  drainage. No streaking erythema. On ultrasound there appears to be a small fluid collection initially. Plan  for incision and drainage. Patient agrees with plan. After anesthesia of the area I attempted a needle aspiration of a small pocket of abscess without success. I then used ultrasound guidance to determine the location of the small fluid collection. I was unable to find again the area of fluid collection again. This is either very small or I had obtained all the drainage I would get with needle aspiration. At this time will cover her with Bactrim. I advised if she develops focal collection of abscess, drainage, fevers, new or worsening symptoms or new concerns she needs to return to the emergency department immediately. Patient agrees with plan. I advised the patient to follow-up with their primary care provider this week. I advised the patient to return to the emergency department with new or worsening symptoms or new concerns. The patient verbalized understanding and agreement with plan.        Final Clinical Impressions(s) / ED Diagnoses   Final diagnoses:  Cellulitis of buttock    New Prescriptions Discharge Medication List as of 07/12/2016 11:36 PM    START taking these medications   Details  naproxen (NAPROSYN) 500 MG tablet Take 1 tablet (500 mg total) by mouth 2 (two) times daily with a meal., Starting Sat 07/12/2016, Print    sulfamethoxazole-trimethoprim (BACTRIM DS,SEPTRA DS) 800-160 MG tablet Take 1 tablet by mouth 2 (two) times daily., Starting Sat 07/12/2016, Until Sat 07/19/2016, Print        I personally performed the services described in this documentation, which was scribed in my presence. The recorded information has been reviewed and is accurate.      Everlene Farrier, PA-C 07/13/16 2130    Linwood Dibbles, MD 07/13/16 [phone]

## 2016-07-13 ENCOUNTER — Encounter (HOSPITAL_BASED_OUTPATIENT_CLINIC_OR_DEPARTMENT_OTHER): Payer: Self-pay | Admitting: Emergency Medicine

## 2016-07-13 ENCOUNTER — Emergency Department (HOSPITAL_BASED_OUTPATIENT_CLINIC_OR_DEPARTMENT_OTHER)
Admission: EM | Admit: 2016-07-13 | Discharge: 2016-07-13 | Disposition: A | Discharge status: Home / Self Care | Payer: Medicare Other | Attending: Emergency Medicine | Admitting: Emergency Medicine

## 2016-07-13 DIAGNOSIS — L02415 Cutaneous abscess of right lower limb: Secondary | ICD-10-CM | POA: Diagnosis not present

## 2016-07-13 DIAGNOSIS — L02215 Cutaneous abscess of perineum: Secondary | ICD-10-CM | POA: Diagnosis not present

## 2016-07-13 DIAGNOSIS — Z87891 Personal history of nicotine dependence: Secondary | ICD-10-CM | POA: Insufficient documentation

## 2016-07-13 DIAGNOSIS — E039 Hypothyroidism, unspecified: Secondary | ICD-10-CM | POA: Diagnosis not present

## 2016-07-13 MED ORDER — LIDOCAINE HCL (PF) 1 % IJ SOLN
5.0000 mL | Freq: Once | INTRAMUSCULAR | Status: AC
  Administered 2016-07-13: 5 mL via INTRADERMAL
  Filled 2016-07-13: qty 5

## 2016-07-13 MED ORDER — KETOROLAC TROMETHAMINE 15 MG/ML IJ SOLN
30.0000 mg | Freq: Once | INTRAMUSCULAR | Status: AC
  Administered 2016-07-13: 30 mg via INTRAMUSCULAR
  Filled 2016-07-13: qty 2

## 2016-07-13 MED ORDER — DIAZEPAM 5 MG PO TABS
5.0000 mg | ORAL_TABLET | Freq: Once | ORAL | Status: AC
  Administered 2016-07-13: 5 mg via ORAL
  Filled 2016-07-13: qty 1

## 2016-07-13 NOTE — ED Triage Notes (Signed)
Pt seen here yesterday for abscess on perineum, states doctor could not drain it and was sent home with abx. Reports it has increased in size and the pain is worse.

## 2016-07-13 NOTE — ED Provider Notes (Signed)
[facility] Provider Note   CSN: [id number] Arrival date & time: 07/13/16  1734   By signing my name below, I, Teofilo Pod, attest that this documentation has been prepared under the direction and in the presence of Raeford Razor, MD . Electronically Signed: Teofilo Pod, ED Scribe. 07/13/2016. 6:14 PM.   History   Chief Complaint Chief Complaint  Patient presents with  . Abscess    The history is provided by the patient. No language interpreter was used.   HPI Comments:  Lindsey Nicholson is a 46 y.o. female who presents to the Emergency Department complaining of gradually worsening area of pain and swelling to the right buttock since yesterday. Pt was seen here yesterday for the same, was unable to have to area drained, and was treated with naproxen which has provided no relief. She reports hx of previous similar abscesses. Denies fever.    Past Medical History:  Diagnosis Date  . Anxiety   . Bipolar 1 disorder (HCC)   . Borderline personality disorder   . Cervical dysplasia   . GERD (gastroesophageal reflux disease)   . History of vulvar dysplasia    vin I  --- s/p  wide excision 06-21-2012   . Hypothyroidism   . Moderate major depression (HCC)   . NAFL (nonalcoholic fatty liver)   . Premature ovarian failure   . Wears glasses     Patient Active Problem List   Diagnosis Date Noted  . 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 Bipolar 1 disorder, depressed (HCC) 04/16/2012  . Depression, major, recurrent, moderate (HCC) 04/01/2012    Past Surgical History:  Procedure Laterality Date  . CARPAL TUNNEL RELEASE Bilateral LEFT  2013//  RIGHT  JAN  2015  . CHOLECYSTECTOMY  2011  . HYSTEROSCOPY W/D&C  10/17/2010   Procedure: DILATATION AND CURETTAGE (D&C) /HYSTEROSCOPY;  Surgeon: Jessee Avers;  Location: [facility];  Service: Gynecology;  Laterality: N/A;  . HYSTEROSCOPY W/D&C N/A 06/21/2012   Procedure: DILATATION AND CURETTAGE /HYSTEROSCOPY;  Surgeon: [name], MD;  Location: [facility];  Service: Gynecology;  Laterality: N/A;  . LAPAROSCOPIC TUBAL LIGATION  01/09/2011   Procedure: LAPAROSCOPIC TUBAL LIGATION;  Surgeon: [name];  Location: [facility];  Service: Gynecology;  Laterality: Bilateral;  with Filshie clips  . LEEP  01/09/2011   Procedure: LOOP ELECTROSURGICAL EXCISION PROCEDURE (LEEP);  Surgeon: [name];  Location: [facility];  Service: Gynecology;  Laterality: N/A;  . LEEP N/A 09/16/2013   Procedure: LOOP ELECTROSURGICAL EXCISION PROCEDURE (LEEP);  Surgeon: [name], MD;  Location: [facility];  Service: Gynecology;  Laterality: N/A;  . NEGATIVE SLEEP STUDY  2014   per pt  . ORIF LEFT FOOT FX  2008   HARDWARE  REMOVED 6 MONTHS LATER  . VULVECTOMY N/A 06/21/2012   Procedure: WIDE EXCISION VULVECTOMY;  Surgeon: [name], MD;  Location: [facility];  Service: Gynecology;  Laterality: N/A;  excision of vulvar lesions,  need colposcope    OB History    Gravida Para Term Preterm AB Living   0             SAB TAB Ectopic Multiple Live Births                   Home Medications    Prior to Admission medications   Medication Sig Start Date End Date Taking? Authorizing Provider  amantadine (SYMMETREL) 100 MG capsule Take 100 mg by mouth  2 (two) times daily.    Historical Provider, MD  buPROPion (WELLBUTRIN XL) 150 MG 24 hr tablet Take 150 mg by mouth every morning.    Historical Provider, MD  buPROPion (WELLBUTRIN XL) 300 MG 24 hr tablet Take 300 mg by mouth every morning.     Historical Provider, MD  divalproex (DEPAKOTE ER) 250 MG 24 hr tablet Take 250 mg by mouth daily. Take two tablets at bedtime    Historical Provider, MD  DULoxetine (CYMBALTA) 60 MG capsule Take 60 mg by mouth daily.     Historical Provider, MD  hydrochlorothiazide (HYDRODIURIL) 25 MG tablet Take 25 mg by mouth daily.    Historical Provider, MD  hydrOXYzine (ATARAX/VISTARIL) 10 MG tablet Take 10 mg by mouth 2 times daily  at 12 noon and 4 pm.    Historical Provider, MD  levothyroxine (SYNTHROID, LEVOTHROID) 150 MCG tablet Take 150 mcg by mouth daily before breakfast. Take two tablets before breakfast    Historical Provider, MD  naproxen (NAPROSYN) 500 MG tablet Take 1 tablet (500 mg total) by mouth 2 (two) times daily with a meal. 07/12/16   Everlene Farrier, PA-C  phentermine 37.5 MG capsule Take 37.5 mg by mouth every morning.    Historical Provider, MD  sulfamethoxazole-trimethoprim (BACTRIM DS,SEPTRA DS) 800-160 MG tablet Take 1 tablet by mouth 2 (two) times daily. 07/12/16 07/19/16  Everlene Farrier, PA-C  traMADol (ULTRAM) 50 MG tablet Take 1 tablet (50 mg total) by mouth every 8 (eight) hours as needed. 07/02/16   Kristen N Ward, DO  zolpidem (AMBIEN) 10 MG tablet Take 10 mg by mouth at bedtime as needed for sleep.    Historical Provider, MD    Family History Family History  Problem Relation Age of Onset  . Cancer Mother   . Mental illness Mother   . Alcohol abuse Mother   . Drug abuse Mother   . Bipolar disorder Sister   . Alcohol abuse Maternal Aunt   . Drug abuse Maternal Aunt   . Depression Maternal Aunt   . Schizophrenia Maternal Aunt   . Alcohol abuse Maternal Uncle   . Alcohol abuse Maternal Grandfather   . Diabetes Father     Social History Social History  Substance Use Topics  . Smoking status: Former Smoker    Packs/day: 0.50    Years: 25.00    Types: Cigarettes    Quit date: 08/16/2015  . Smokeless tobacco: Never Used  . Alcohol use No     Allergies   Codeine; Hydrocodone; Oxycodone; and Trazodone and nefazodone   Review of Systems Review of Systems  Constitutional: Negative for fever.  Skin:       +area of pain and swelling to right buttock  All other systems reviewed and are negative.    Physical Exam Updated Vital Signs BP (!) 124/96 (BP Location: Left Arm)   Pulse (!) 110   Temp 98.4 F (36.9 C) (Oral)   Resp (!) 23   SpO2 100%   Physical Exam  Constitutional:  She is oriented to person, place, and time. She appears well-developed and well-nourished. No distress.  HENT:  Head: Normocephalic and atraumatic.  Eyes: EOM are normal.  Neck: Normal range of motion.  Cardiovascular: Normal rate, regular rhythm and normal heart sounds.   Pulmonary/Chest: Effort normal and breath sounds normal.  Abdominal: Soft. She exhibits no distension. There is no tenderness.  Musculoskeletal: Normal range of motion.  Neurological: She is alert and oriented to person, place, and  time.  Skin: Skin is warm and dry.  Indurated area to proximal right medial thigh. No fluctuance. Not a bartholin abscess. Does not extend to anus.   Psychiatric: She has a normal mood and affect. Judgment normal.  Nursing note and vitals reviewed.    ED Treatments / Results  DIAGNOSTIC STUDIES:  Oxygen Saturation is 100% on RA, normal by my interpretation.    COORDINATION OF CARE:  6:14 PM  Discussed treatment plan with pt at bedside and pt agreed to plan.   Labs (all labs ordered are listed, but only abnormal results are displayed) Labs Reviewed - No data to display  EKG  EKG Interpretation None       Radiology No results found.  Procedures Procedures (including critical care time)  INCISION AND DRAINAGE Performed by: Raeford Razor Consent: Verbal consent obtained. Risks and benefits: risks, benefits and alternatives were discussed Type: abscess  Body area: proximal R thigh  Anesthesia: local infiltration  Incision was made with a scalpel.  Local anesthetic: lidocaine 1% Anesthetic total: 2 ml  Complexity: complex  Blunt dissection to break up loculations  Drainage: purulent  Drainage amount:  Packing material: none  Patient tolerance: Patient tolerated the procedure well with no immediate complications.    Medications Ordered in ED Medications - No data to display   Initial Impression / Assessment and Plan / ED Course  I have reviewed the  triage vital signs and the nursing notes.  Pertinent labs & imaging results that were available during my care of the patient were reviewed by me and considered in my medical decision making (see chart for details).   Final Clinical Impressions(s) / ED Diagnoses   Final diagnoses:  Abscess, perineum    New Prescriptions New Prescriptions   No medications on file   I personally preformed the services scribed in my presence. The recorded information has been reviewed is accurate. Raeford Razor, MD.     Raeford Razor, MD 07/24/16 [phone]

## 2016-07-29 ENCOUNTER — Ambulatory Visit
Admission: RE | Admit: 2016-07-29 | Discharge: 2016-07-29 | Disposition: A | Discharge status: Home / Self Care | Payer: Medicare Other | Source: Ambulatory Visit | Attending: Obstetrics and Gynecology | Admitting: Obstetrics and Gynecology

## 2016-07-29 DIAGNOSIS — N644 Mastodynia: Secondary | ICD-10-CM

## 2016-07-29 MED ORDER — GADOBENATE DIMEGLUMINE 529 MG/ML IV SOLN
20.0000 mL | Freq: Once | INTRAVENOUS | Status: AC | PRN
  Administered 2016-07-29: 20 mL via INTRAVENOUS

## 2016-10-08 HISTORY — PX: STOMACH SURGERY: SHX791

## 2016-10-23 ENCOUNTER — Encounter (HOSPITAL_BASED_OUTPATIENT_CLINIC_OR_DEPARTMENT_OTHER): Payer: Self-pay | Admitting: *Deleted

## 2016-10-23 ENCOUNTER — Emergency Department (HOSPITAL_BASED_OUTPATIENT_CLINIC_OR_DEPARTMENT_OTHER)
Admission: EM | Admit: 2016-10-23 | Discharge: 2016-10-23 | Disposition: A | Discharge status: Home / Self Care | Payer: Medicare Other | Attending: Emergency Medicine | Admitting: Emergency Medicine

## 2016-10-23 ENCOUNTER — Emergency Department (HOSPITAL_BASED_OUTPATIENT_CLINIC_OR_DEPARTMENT_OTHER): Payer: Medicare Other

## 2016-10-23 DIAGNOSIS — Z79899 Other long term (current) drug therapy: Secondary | ICD-10-CM | POA: Insufficient documentation

## 2016-10-23 DIAGNOSIS — Y929 Unspecified place or not applicable: Secondary | ICD-10-CM | POA: Insufficient documentation

## 2016-10-23 DIAGNOSIS — Y939 Activity, unspecified: Secondary | ICD-10-CM | POA: Insufficient documentation

## 2016-10-23 DIAGNOSIS — E039 Hypothyroidism, unspecified: Secondary | ICD-10-CM | POA: Insufficient documentation

## 2016-10-23 DIAGNOSIS — T1490XA Injury, unspecified, initial encounter: Secondary | ICD-10-CM

## 2016-10-23 DIAGNOSIS — Z87891 Personal history of nicotine dependence: Secondary | ICD-10-CM | POA: Diagnosis not present

## 2016-10-23 DIAGNOSIS — M79645 Pain in left finger(s): Secondary | ICD-10-CM | POA: Diagnosis not present

## 2016-10-23 DIAGNOSIS — W230XXA Caught, crushed, jammed, or pinched between moving objects, initial encounter: Secondary | ICD-10-CM | POA: Insufficient documentation

## 2016-10-23 DIAGNOSIS — S6992XA Unspecified injury of left wrist, hand and finger(s), initial encounter: Secondary | ICD-10-CM

## 2016-10-23 DIAGNOSIS — Y999 Unspecified external cause status: Secondary | ICD-10-CM | POA: Insufficient documentation

## 2016-10-23 NOTE — ED Provider Notes (Signed)
[facility]   CSN: [id number] Arrival date & time: 10/23/16  1745  By signing my name below, I, Thelma Barge, attest that this documentation has been prepared under the direction and in the presence of Sury Wentworth, PA-C. Electronically Signed: Thelma Barge, Scribe. 10/23/16. 7:08 PM.  History   Chief Complaint Chief Complaint  Patient presents with  . Finger Injury   The history is provided by the patient. No language interpreter was used.    HPI Comments: Lindsey Nicholson is a 46 y.o. female who presents to the Emergency Department complaining of Left index finger injury that occurred about 3 hours prior to arrival. Patient states she shut her finger in the top of a car door. Complains of 8/10, throbbing, nonradiating pain. Has not taken any medications for her symptoms. Pt notes she has an orbera balloon in place, but she states she has no restrictions on medications, including NSAIDs. Denies numbness, weakness, or any other complaints.    Past Medical History:  Diagnosis Date  . Anxiety   . Bipolar 1 disorder (HCC)   . Borderline personality disorder   . Cervical dysplasia   . GERD (gastroesophageal reflux disease)   . History of vulvar dysplasia    vin I  --- s/p  wide excision 06-21-2012   . Hypothyroidism   . Moderate major depression (HCC)   . NAFL (nonalcoholic fatty liver)   . Premature ovarian failure   . Wears glasses     Patient Active Problem List   Diagnosis Date Noted  . 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 Bipolar 1 disorder, depressed (HCC) 04/16/2012  . Depression, major, recurrent, moderate (HCC) 04/01/2012    Past Surgical History:  Procedure Laterality Date  . CARPAL TUNNEL RELEASE Bilateral LEFT  2013//  RIGHT  JAN  2015  . CHOLECYSTECTOMY  2011  . HYSTEROSCOPY W/D&C  10/17/2010   Procedure: DILATATION AND CURETTAGE (D&C) /HYSTEROSCOPY;  Surgeon: Jessee Avers;  Location: [facility];  Service: Gynecology;  Laterality: N/A;  .  HYSTEROSCOPY W/D&C N/A 06/21/2012   Procedure: DILATATION AND CURETTAGE /HYSTEROSCOPY;  Surgeon: [name], MD;  Location: [facility];  Service: Gynecology;  Laterality: N/A;  . LAPAROSCOPIC TUBAL LIGATION  01/09/2011   Procedure: LAPAROSCOPIC TUBAL LIGATION;  Surgeon: [name];  Location: [facility];  Service: Gynecology;  Laterality: Bilateral;  with Filshie clips  . LEEP  01/09/2011   Procedure: LOOP ELECTROSURGICAL EXCISION PROCEDURE (LEEP);  Surgeon: [name];  Location: [facility];  Service: Gynecology;  Laterality: N/A;  . LEEP N/A 09/16/2013   Procedure: LOOP ELECTROSURGICAL EXCISION PROCEDURE (LEEP);  Surgeon: [name], MD;  Location: [facility];  Service: Gynecology;  Laterality: N/A;  . NEGATIVE SLEEP STUDY  2014   per pt  . ORIF LEFT FOOT FX  2008   HARDWARE  REMOVED 6 MONTHS LATER  . VULVECTOMY N/A 06/21/2012   Procedure: WIDE EXCISION VULVECTOMY;  Surgeon: [name], MD;  Location: [facility];  Service: Gynecology;  Laterality: N/A;  excision of vulvar lesions,  need colposcope    OB History    Gravida Para Term Preterm AB Living   0             SAB TAB Ectopic Multiple Live Births                   Home Medications    Prior to Admission medications   Medication Sig Start Date End Date Taking?  Authorizing Provider  amantadine (SYMMETREL) 100 MG capsule Take 100 mg by mouth 2 (two) times daily.    [provider]  buPROPion (WELLBUTRIN XL) 150 MG 24 hr tablet Take 150 mg by mouth every morning.    [provider]  buPROPion (WELLBUTRIN XL) 300 MG 24 hr tablet Take 300 mg by mouth every morning.     [provider]  divalproex (DEPAKOTE ER) 250 MG 24 hr tablet Take 250 mg by mouth daily. Take two tablets at bedtime    [provider]  DULoxetine (CYMBALTA) 60 MG capsule Take 60 mg by mouth daily.     [provider]  hydrOXYzine (ATARAX/VISTARIL) 10 MG tablet Take 10 mg by mouth 2 times  daily at 12 noon and 4 pm.    [provider]  levothyroxine (SYNTHROID, LEVOTHROID) 150 MCG tablet Take 150 mcg by mouth daily before breakfast. Take two tablets before breakfast    [provider]  naproxen (NAPROSYN) 500 MG tablet Take 1 tablet (500 mg total) by mouth 2 (two) times daily with a meal. 07/12/16   Everlene Farrieransie, William, PA-C  traMADol (ULTRAM) 50 MG tablet Take 1 tablet (50 mg total) by mouth every 8 (eight) hours as needed. 07/02/16   Ward, Layla MawKristen N, DO  zolpidem (AMBIEN) 10 MG tablet Take 10 mg by mouth at bedtime as needed for sleep.    [provider]    Family History Family History  Problem Relation Age of Onset  . Cancer Mother   . Mental illness Mother   . Alcohol abuse Mother   . Drug abuse Mother   . Bipolar disorder Sister   . Alcohol abuse Maternal Aunt   . Drug abuse Maternal Aunt   . Depression Maternal Aunt   . Schizophrenia Maternal Aunt   . Alcohol abuse Maternal Uncle   . Alcohol abuse Maternal Grandfather   . Diabetes Father     Social History Social History  Substance Use Topics  . Smoking status: Former Smoker    Packs/day: 0.50    Years: 25.00    Types: Cigarettes    Quit date: 08/16/2015  . Smokeless tobacco: Never Used  . Alcohol use No     Allergies   Codeine; Hydrocodone; Oxycodone; and Trazodone and nefazodone   Review of Systems Review of Systems  Musculoskeletal: Positive for arthralgias.  Neurological: Negative for weakness and numbness.     Physical Exam Updated Vital Signs BP (!) 151/100 (BP Location: Left Arm)   Pulse 98   Temp 98.5 F (36.9 C) (Oral)   Resp 16   Ht 5' 3.5" (1.613 m)   Wt (!) 138.8 kg (306 lb)   SpO2 98%   BMI 53.36 kg/m   Physical Exam  Constitutional: She appears well-developed and well-nourished. No distress.  HENT:  Head: Normocephalic and atraumatic.  Eyes: Conjunctivae are normal.  Neck: Neck supple.  Cardiovascular: Normal rate, regular rhythm and intact  distal pulses.   Pulmonary/Chest: Effort normal.  Musculoskeletal: She exhibits tenderness. She exhibits no edema or deformity.       Left hand: She exhibits tenderness.  TTP along the length of the left index finger without noted swelling, bruising, erythema, or deformity. Pain with flexion and extension at the MCP and PIP joints. Flexion and extension is intact against resistance  Neurological: She is alert.  No noted sensory deficits to the left index finger. Strength is 5/5 at the DIP, PIP, and MCP joints.  Skin: Skin is  warm and dry. Capillary refill takes less than 2 seconds. She is not diaphoretic.  Psychiatric: She has a normal mood and affect. Her behavior is normal.  Nursing note and vitals reviewed.    ED Treatments / Results  DIAGNOSTIC STUDIES: Oxygen Saturation is 98% on RA, normal by my interpretation.    COORDINATION OF CARE: 7:02 PM Discussed treatment plan with pt at bedside and pt agreed to plan.  Labs (all labs ordered are listed, but only abnormal results are displayed) Labs Reviewed - No data to display  EKG  EKG Interpretation None       Radiology Dg Finger Index Left  Result Date: 10/23/2016 CLINICAL DATA:  Trauma to the left index finger with swelling and bruising. EXAM: LEFT INDEX FINGER 2+V COMPARISON:  None. FINDINGS: There is no evidence of fracture or dislocation. There is no evidence of arthropathy or other focal bone abnormality. Soft tissues are unremarkable. IMPRESSION: Negative. Electronically Signed   By: Sherian ReinWei-Chen  Lin M.D.   On: 10/23/2016 18:35    Procedures .Nerve Block Date/Time: 10/23/2016 7:06 PM Performed by: Anselm PancoastJOY, Zian Delair C Authorized by: Anselm PancoastJOY, Mylz Yuan C   Consent:    Consent obtained:  Verbal   Consent given by:  Patient   Risks discussed:  Pain, unsuccessful block and infection Indications:    Indications:  Pain relief Location:    Body area:  Upper extremity   Upper extremity nerve blocked: Digital.   Laterality:   Left Pre-procedure details:    Skin preparation:  Povidone-iodine Procedure details (see MAR for exact dosages):    Block needle gauge:  25 G   Anesthetic injected:  Bupivacaine 0.5% w/o epi Post-procedure details:    Outcome:  Pain relieved   Patient tolerance of procedure:  Tolerated well, no immediate complications    (including critical care time)  Medications Ordered in ED Medications - No data to display   Initial Impression / Assessment and Plan / ED Course  I have reviewed the triage vital signs and the nursing notes.  Pertinent labs & imaging results that were available during my care of the patient were reviewed by me and considered in my medical decision making (see chart for details).     Patient presents with left index finger injury. No fracture noted on x-ray. Digital block performed for pain control. Block successful with no immediate complications. PCP follow-up as needed. The patient was given instructions for home care as well as return precautions. Patient voices understanding of these instructions, accepts the plan, and is comfortable with discharge.  Final Clinical Impressions(s) / ED Diagnoses   Final diagnoses:  Injury  Injury of finger of left hand, initial encounter    New Prescriptions Discharge Medication List as of 10/23/2016  7:18 PM    I personally performed the services described in this documentation, which was scribed in my presence. The recorded information has been reviewed and is accurate.   Concepcion LivingJoy, Pennie Vanblarcom C, PA-C 10/25/16 0203    Tilden Fossaees, Elizabeth, MD 10/26/16 [phone]

## 2016-10-23 NOTE — ED Triage Notes (Signed)
Pt c/o left index finger injury x 1 hr ago

## 2016-10-23 NOTE — Discharge Instructions (Signed)
You have been seen today for a finger injury. There were no acute abnormalities on the x-rays, including no sign of fracture or dislocation. Pain: Take 600 mg of ibuprofen every 6 hours or 440 mg (over the counter dose) to 500 mg (prescription dose) of naproxen every 12 hours or for the next 3 days. After this time, these medications may be used as needed for pain. Take these medications with food to avoid upset stomach. Choose only one of these medications, do not take them together.  Tylenol: Should you continue to have additional pain while taking the ibuprofen or naproxen, you may add in tylenol as needed. Your daily total maximum amount of tylenol from all sources should be limited to 4000mg /day for persons without liver problems, or 2000mg /day for those with liver problems. Ice: May apply ice to the area over the next 24 hours for 15 minutes at a time to reduce swelling. Elevation: Keep the extremity elevated as often as possible to reduce pain and inflammation. Support: Wear the finger splint for support and comfort. Wear this until pain resolves.  Exercises: Start by performing these exercises a few times a week, increasing the frequency until you are performing them twice daily.  Follow up: Follow up with your primary care provider for any continued management.

## 2017-01-13 ENCOUNTER — Emergency Department (HOSPITAL_BASED_OUTPATIENT_CLINIC_OR_DEPARTMENT_OTHER): Payer: Medicare Other

## 2017-01-13 ENCOUNTER — Emergency Department (HOSPITAL_BASED_OUTPATIENT_CLINIC_OR_DEPARTMENT_OTHER)
Admission: EM | Admit: 2017-01-13 | Discharge: 2017-01-13 | Disposition: A | Discharge status: Home / Self Care | Payer: Medicare Other | Attending: Emergency Medicine | Admitting: Emergency Medicine

## 2017-01-13 ENCOUNTER — Encounter (HOSPITAL_BASED_OUTPATIENT_CLINIC_OR_DEPARTMENT_OTHER): Payer: Self-pay

## 2017-01-13 DIAGNOSIS — F17210 Nicotine dependence, cigarettes, uncomplicated: Secondary | ICD-10-CM | POA: Diagnosis not present

## 2017-01-13 DIAGNOSIS — Z79899 Other long term (current) drug therapy: Secondary | ICD-10-CM | POA: Diagnosis not present

## 2017-01-13 DIAGNOSIS — M25562 Pain in left knee: Secondary | ICD-10-CM | POA: Insufficient documentation

## 2017-01-13 DIAGNOSIS — E039 Hypothyroidism, unspecified: Secondary | ICD-10-CM | POA: Insufficient documentation

## 2017-01-13 MED ORDER — NAPROXEN 500 MG PO TABS: 500.0000 mg | ORAL_TABLET | Freq: Two times a day (BID) | ORAL | 0 refills | Status: DC

## 2017-01-13 NOTE — ED Notes (Signed)
ED Provider at bedside. 

## 2017-01-13 NOTE — ED Triage Notes (Signed)
C/o left knee x 1 week-denies injury-pain started after being on knees quilting-NAD-steady gait

## 2017-01-13 NOTE — ED Notes (Signed)
Patient transported to X-ray 

## 2017-01-13 NOTE — ED Provider Notes (Signed)
[facility] EMERGENCY DEPARTMENT Provider Note   CSN: [id number] Arrival date & time: 01/13/17  2124     History   Chief Complaint Chief Complaint  Patient presents with  . Knee Pain    HPI Lindsey Nicholson is a 46 y.o. female.  The history is provided by the patient. No language interpreter was used.  Knee Pain   The current episode started more than 1 week ago. The problem occurs constantly. The problem has been gradually worsening. The pain is present in the left knee. The quality of the pain is described as aching and pounding. The pain is moderate. Associated symptoms include limited range of motion. The symptoms are aggravated by standing. There has been no history of extremity trauma.    Past Medical History:  Diagnosis Date  . Anxiety   . Bipolar 1 disorder (HCC)   . Borderline personality disorder (HCC)   . Cervical dysplasia   . GERD (gastroesophageal reflux disease)   . History of vulvar dysplasia    vin I  --- s/p  wide excision 06-21-2012   . Hypothyroidism   . Moderate major depression (HCC)   . NAFL (nonalcoholic fatty liver)   . Premature ovarian failure   . Wears glasses     Patient Active Problem List   Diagnosis Date Noted  . 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 Bipolar 1 disorder, depressed (HCC) 04/16/2012  . Depression, major, recurrent, moderate (HCC) 04/01/2012    Past Surgical History:  Procedure Laterality Date  . CARPAL TUNNEL RELEASE Bilateral LEFT  2013//  RIGHT  JAN  2015  . CHOLECYSTECTOMY  2011  . HYSTEROSCOPY W/D&C  10/17/2010   Procedure: DILATATION AND CURETTAGE (D&C) /HYSTEROSCOPY;  Surgeon: Jessee Avers;  Location: [facility];  Service: Gynecology;  Laterality: N/A;  . HYSTEROSCOPY W/D&C N/A 06/21/2012   Procedure: DILATATION AND CURETTAGE /HYSTEROSCOPY;  Surgeon: [name], MD;  Location: [facility];  Service: Gynecology;  Laterality: N/A;  . LAPAROSCOPIC TUBAL LIGATION  01/09/2011   Procedure: LAPAROSCOPIC TUBAL  LIGATION;  Surgeon: [name];  Location: [facility];  Service: Gynecology;  Laterality: Bilateral;  with Filshie clips  . LEEP  01/09/2011   Procedure: LOOP ELECTROSURGICAL EXCISION PROCEDURE (LEEP);  Surgeon: [name];  Location: [facility];  Service: Gynecology;  Laterality: N/A;  . LEEP N/A 09/16/2013   Procedure: LOOP ELECTROSURGICAL EXCISION PROCEDURE (LEEP);  Surgeon: [name], MD;  Location: [facility];  Service: Gynecology;  Laterality: N/A;  . NEGATIVE SLEEP STUDY  2014   per pt  . ORIF LEFT FOOT FX  2008   HARDWARE  REMOVED 6 MONTHS LATER  . STOMACH SURGERY    . VULVECTOMY N/A 06/21/2012   Procedure: WIDE EXCISION VULVECTOMY;  Surgeon: [name], MD;  Location: [facility];  Service: Gynecology;  Laterality: N/A;  excision of vulvar lesions,  need colposcope    OB History    Gravida Para Term Preterm AB Living   0             SAB TAB Ectopic Multiple Live Births                   Home Medications    Prior to Admission medications   Medication Sig Start Date End Date Taking? Authorizing Provider  amantadine (SYMMETREL) 100 MG capsule Take 100 mg by mouth 2 (two) times daily.    [provider]  buPROPion (WELLBUTRIN XL) 150 MG 24 hr  tablet Take 150 mg by mouth every morning.    [provider]  buPROPion (WELLBUTRIN XL) 300 MG 24 hr tablet Take 300 mg by mouth every morning.     [provider]  divalproex (DEPAKOTE ER) 250 MG 24 hr tablet Take 250 mg by mouth daily. Take two tablets at bedtime    [provider]  DULoxetine (CYMBALTA) 60 MG capsule Take 60 mg by mouth daily.     [provider]  hydrOXYzine (ATARAX/VISTARIL) 10 MG tablet Take 10 mg by mouth 2 times daily at 12 noon and 4 pm.    [provider]  levothyroxine (SYNTHROID, LEVOTHROID) 150 MCG tablet Take 150 mcg by mouth daily before breakfast. Take two tablets before breakfast    [provider]  naproxen  (NAPROSYN) 500 MG tablet Take 1 tablet (500 mg total) by mouth 2 (two) times daily with a meal. 07/12/16   Everlene Farrier, PA-C  traMADol (ULTRAM) 50 MG tablet Take 1 tablet (50 mg total) by mouth every 8 (eight) hours as needed. 07/02/16   Ward, Layla Maw, DO  zolpidem (AMBIEN) 10 MG tablet Take 10 mg by mouth at bedtime as needed for sleep.    [provider]    Family History Family History  Problem Relation Age of Onset  . Cancer Mother   . Mental illness Mother   . Alcohol abuse Mother   . Drug abuse Mother   . Bipolar disorder Sister   . Alcohol abuse Maternal Aunt   . Drug abuse Maternal Aunt   . Depression Maternal Aunt   . Schizophrenia Maternal Aunt   . Alcohol abuse Maternal Uncle   . Alcohol abuse Maternal Grandfather   . Diabetes Father     Social History Social History  Substance Use Topics  . Smoking status: Current Every Day Smoker    Packs/day: 0.50    Years: 25.00    Types: Cigarettes  . Smokeless tobacco: Never Used  . Alcohol use No     Allergies   Codeine; Hydrocodone; Oxycodone; and Trazodone and nefazodone   Review of Systems Review of Systems  Musculoskeletal: Positive for arthralgias.  All other systems reviewed and are negative.    Physical Exam Updated Vital Signs BP 136/88 (BP Location: Left Arm)   Pulse 98   Resp 18   Ht  (1.6 m)   Wt 135 kg (297 lb 9.9 oz)   BMI 52.72 kg/m   Physical Exam  Constitutional: Lindsey Nicholson appears well-developed and well-nourished. No distress.  HENT:  Head: Normocephalic and atraumatic.  Eyes: Conjunctivae are normal.  Neck: Neck supple.  Cardiovascular: Normal rate and regular rhythm.   No murmur heard. Pulmonary/Chest: Effort normal and breath sounds normal. No respiratory distress.  Abdominal: Soft. There is no tenderness.  Musculoskeletal: Lindsey Nicholson exhibits tenderness. Lindsey Nicholson exhibits no edema.       Left knee: Tenderness found. Patellar tendon tenderness noted.  Neurological: Lindsey Nicholson is alert.    Skin: Skin is warm and dry.  Psychiatric: Lindsey Nicholson has a normal mood and affect.  Nursing note and vitals reviewed.    ED Treatments / Results  Labs (all labs ordered are listed, but only abnormal results are displayed) Labs Reviewed - No data to display  EKG  EKG Interpretation None       Radiology Dg Knee Complete 4 Views Left  Result Date: 01/13/2017 CLINICAL DATA:  Increasing left knee pain without history of trauma. EXAM: LEFT KNEE - COMPLETE 4+ VIEW  COMPARISON:  None. FINDINGS: No evidence of fracture, dislocation, or joint effusion. Slight medial femorotibial joint space narrowing. No evidence of arthropathy or other focal bone abnormality. Soft tissues are unremarkable. IMPRESSION: Slight medial femorotibial joint space narrowing. No joint effusion, fracture or malalignment. Electronically Signed   By: Tollie Eth M.D.   On: 01/13/2017 22:42    Procedures Procedures (including critical care time)  Medications Ordered in ED Medications - No data to display   Initial Impression / Assessment and Plan / ED Course  I have reviewed the triage vital signs and the nursing notes.  Pertinent labs & imaging results that were available during my care of the patient were reviewed by me and considered in my medical decision making (see chart for details).     Patient X-Ray negative for obvious fracture or dislocation.  Pt advised to follow up with orthopedics. Patient given knee sleeve and crutches while in ED, conservative therapy recommended and discussed. Patient will be discharged home & is agreeable with above plan. Returns precautions discussed. Pt appears safe for discharge.  Final Clinical Impressions(s) / ED Diagnoses   Final diagnoses:  Acute pain of left knee    New Prescriptions New Prescriptions   NAPROXEN (NAPROSYN) 500 MG TABLET    Take 1 tablet (500 mg total) by mouth 2 (two) times daily.     Felicie Morn, NP 01/13/17 2300    Azalia Bilis, MD 01/13/17  Angela Nevin    Azalia Bilis, MD 04/06/18 [phone]

## 2017-02-18 ENCOUNTER — Ambulatory Visit (HOSPITAL_BASED_OUTPATIENT_CLINIC_OR_DEPARTMENT_OTHER)
Admission: RE | Admit: 2017-02-18 | Discharge: 2017-02-18 | Disposition: A | Discharge status: Home / Self Care | Payer: Medicare Other | Source: Ambulatory Visit | Attending: Ophthalmology | Admitting: Ophthalmology

## 2017-02-18 ENCOUNTER — Other Ambulatory Visit (HOSPITAL_BASED_OUTPATIENT_CLINIC_OR_DEPARTMENT_OTHER): Payer: Self-pay | Admitting: Ophthalmology

## 2017-02-18 DIAGNOSIS — M1712 Unilateral primary osteoarthritis, left knee: Secondary | ICD-10-CM | POA: Diagnosis present

## 2017-03-22 ENCOUNTER — Emergency Department (HOSPITAL_BASED_OUTPATIENT_CLINIC_OR_DEPARTMENT_OTHER)
Admission: EM | Admit: 2017-03-22 | Discharge: 2017-03-22 | Disposition: A | Discharge status: Home / Self Care | Payer: Medicare Other | Attending: Emergency Medicine | Admitting: Emergency Medicine

## 2017-03-22 ENCOUNTER — Other Ambulatory Visit: Payer: Self-pay

## 2017-03-22 ENCOUNTER — Encounter (HOSPITAL_BASED_OUTPATIENT_CLINIC_OR_DEPARTMENT_OTHER): Payer: Self-pay | Admitting: Emergency Medicine

## 2017-03-22 DIAGNOSIS — E039 Hypothyroidism, unspecified: Secondary | ICD-10-CM | POA: Diagnosis not present

## 2017-03-22 DIAGNOSIS — M5442 Lumbago with sciatica, left side: Secondary | ICD-10-CM | POA: Insufficient documentation

## 2017-03-22 DIAGNOSIS — M545 Low back pain: Secondary | ICD-10-CM | POA: Diagnosis present

## 2017-03-22 DIAGNOSIS — F17210 Nicotine dependence, cigarettes, uncomplicated: Secondary | ICD-10-CM | POA: Diagnosis not present

## 2017-03-22 LAB — URINALYSIS, ROUTINE W REFLEX MICROSCOPIC
Bilirubin Urine: NEGATIVE
Glucose, UA: NEGATIVE mg/dL
Hgb urine dipstick: NEGATIVE
Ketones, ur: NEGATIVE mg/dL
LEUKOCYTES UA: NEGATIVE
Nitrite: NEGATIVE
PROTEIN: NEGATIVE mg/dL
pH: 6 (ref 5.0–8.0)

## 2017-03-22 LAB — PREGNANCY, URINE: PREG TEST UR: NEGATIVE

## 2017-03-22 MED ORDER — DIAZEPAM 5 MG PO TABS: 5.0000 mg | ORAL_TABLET | Freq: Two times a day (BID) | ORAL | 0 refills | Status: DC | PRN

## 2017-03-22 MED ORDER — KETOROLAC TROMETHAMINE 30 MG/ML IJ SOLN
30.0000 mg | Freq: Once | INTRAMUSCULAR | Status: AC
  Administered 2017-03-22: 30 mg via INTRAMUSCULAR
  Filled 2017-03-22: qty 1

## 2017-03-22 MED ORDER — DIAZEPAM 5 MG/ML IJ SOLN
5.0000 mg | Freq: Once | INTRAMUSCULAR | Status: AC
  Administered 2017-03-22: 5 mg via INTRAMUSCULAR
  Filled 2017-03-22: qty 2

## 2017-03-22 MED ORDER — PREDNISONE 20 MG PO TABS: 20.0000 mg | ORAL_TABLET | Freq: Every day | ORAL | 0 refills | Status: AC

## 2017-03-22 NOTE — ED Triage Notes (Signed)
Patient reports that she is having lower back pain  - woke up this am with the pain. She reports that she is only comfortable when she lays down and has her feet elevated

## 2017-03-22 NOTE — Discharge Instructions (Signed)
You have been seen in the Emergency Department (ED)  today for back pain.  Your workup and exam have not shown any acute abnormalities and you are likely suffering from muscle strain or possible problems with your discs, but there is no treatment that will fix your symptoms at this time.  Please take Motrin (ibuprofen) as needed for your pain according to the instructions written on the box.  Alternatively, for the next five days you can take 600mg three times daily with meals (it may upset your stomach).  Take Valium as prescribed for severe pain. Do not drink alcohol, drive or participate in any other potentially dangerous activities while taking this medication as it may make you sleepy. Do not take this medication with any other sedating medications, either prescription or over-the-counter.     Please follow up with your doctor as soon as possible regarding today's ED visit and your back pain.  Return to the ED for worsening back pain, fever, weakness or numbness of either leg, or if you develop either (1) an inability to urinate or have bowel movements, or (2) loss of your ability to control your bathroom functions (if you start having "accidents"), or if you develop other new symptoms that concern you.  

## 2017-03-22 NOTE — ED Provider Notes (Signed)
Emergency Department Provider Note   I have reviewed the triage vital signs and the nursing notes.   HISTORY  Chief Complaint Back Pain   HPI Lindsey Nicholson is a 46 y.o. female with PMH of Bipolar disorder, GERD, and obesity left lower back pain radiating to the left hip.  No injury reported.  Patient states when she woke up this morning she had the pain is worse with movement.  She reports some pain radiating down the entire back of the leg.  Denies any numbness or tingling in the leg.  No bowel or bladder incontinence.  She took Tylenol this morning with no relief in symptoms.  She also has a chronically painful left knee.  She is able to ambulate but has some difficulty.  No fevers or chills.  No dysuria, hesitancy, urgency.   Past Medical History:  Diagnosis Date  . Anxiety   . Bipolar 1 disorder (HCC)   . Borderline personality disorder (HCC)   . Cervical dysplasia   . GERD (gastroesophageal reflux disease)   . History of vulvar dysplasia    vin I  --- s/p  wide excision 06-21-2012   . Hypothyroidism   . Moderate major depression (HCC)   . NAFL (nonalcoholic fatty liver)   . Premature ovarian failure   . Wears glasses     Patient Active Problem List   Diagnosis Date Noted  . 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 Bipolar 1 disorder, depressed (HCC) 04/16/2012  . Depression, major, recurrent, moderate (HCC) 04/01/2012    Past Surgical History:  Procedure Laterality Date  . CARPAL TUNNEL RELEASE Bilateral LEFT  2013//  RIGHT  JAN  2015  . CHOLECYSTECTOMY  2011  . HYSTEROSCOPY W/D&C  10/17/2010   Procedure: DILATATION AND CURETTAGE (D&C) /HYSTEROSCOPY;  Surgeon: Jessee Aversara J. Cole;  Location: [facility];  Service: Gynecology;  Laterality: N/A;  . HYSTEROSCOPY W/D&C N/A 06/21/2012   Procedure: DILATATION AND CURETTAGE /HYSTEROSCOPY;  Surgeon: Meriel Picaichard M Holland, MD;  Location: [facility];  Service: Gynecology;  Laterality: N/A;  . LAPAROSCOPIC TUBAL LIGATION  01/09/2011   Procedure:  LAPAROSCOPIC TUBAL LIGATION;  Surgeon: Meriel Picaichard M Holland;  Location: [facility];  Service: Gynecology;  Laterality: Bilateral;  with Filshie clips  . LEEP  01/09/2011   Procedure: LOOP ELECTROSURGICAL EXCISION PROCEDURE (LEEP);  Surgeon: Meriel Picaichard M Holland;  Location: [facility];  Service: Gynecology;  Laterality: N/A;  . LEEP N/A 09/16/2013   Procedure: LOOP ELECTROSURGICAL EXCISION PROCEDURE (LEEP);  Surgeon: Meriel Picaichard M Holland, MD;  [facility];  Service: Gynecology;  Laterality: N/A;  . NEGATIVE SLEEP STUDY  2014   per pt  . ORIF LEFT FOOT FX  2008   HARDWARE  REMOVED 6 MONTHS LATER  . STOMACH SURGERY    . VULVECTOMY N/A 06/21/2012   Procedure: WIDE EXCISION VULVECTOMY;  Surgeon: Meriel Picaichard M Holland, MD;  Location: [facility];  Service: Gynecology;  Laterality: N/A;  excision of vulvar lesions,  need colposcope    Current Outpatient Rx  . Order #: [id number] Class: Historical Med  . Order #: [id number] Class: Historical Med  . Order #: [id number] Class: Historical Med  . Order #: [id number] Class: Print  . Order #: [id number] Class: Historical Med  . Order #: [id number] Class: Historical Med  . Order #: [id number] Class: Historical Med  . Order #: [id number] Class: Historical Med  . Order #: [id number] Class: Print  . Order #: [id number] Class: Print  . Order #: [id number] Class: Print  . Order #: [id number] Class: Print  . Order #: [id number] Class:  Historical Med    Allergies Codeine; Hydrocodone; Oxycodone; and Trazodone and nefazodone  Family History  Problem Relation Age of Onset  . Cancer Mother   . Mental illness Mother   . Alcohol abuse Mother   . Drug abuse Mother   . Bipolar disorder Sister   . Alcohol abuse Maternal Aunt   . Drug abuse Maternal Aunt   . Depression Maternal Aunt   . Schizophrenia Maternal Aunt   . Alcohol abuse Maternal Uncle   . Alcohol abuse Maternal Grandfather   . Diabetes Father     Social History Social History   Tobacco Use  . Smoking status:  Current Every Day Smoker    Packs/day: 0.50    Years: 25.00    Pack years: 12.50    Types: Cigarettes  . Smokeless tobacco: Never Used  Substance Use Topics  . Alcohol use: No  . Drug use: No    Review of Systems  Constitutional: No fever/chills Eyes: No visual changes. ENT: No sore throat. Cardiovascular: Denies chest pain. Respiratory: Denies shortness of breath. Gastrointestinal: No abdominal pain.  No nausea, no vomiting.  No diarrhea.  No constipation. Genitourinary: Negative for dysuria. Musculoskeletal: Positive for back pain radiating to the left hip.  Skin: Negative for rash. Neurological: Negative for headaches, focal weakness or numbness.  10-point ROS otherwise negative.  ____________________________________________   PHYSICAL EXAM:  VITAL SIGNS: ED Triage Vitals  Enc Vitals Group     BP 03/22/17 1541 (!) 147/111     Pulse Rate 03/22/17 1541 (!) 106     Resp 03/22/17 1541 20     Temp 03/22/17 1541 97.9 F (36.6 C)     Temp Source 03/22/17 1541 Oral     SpO2 03/22/17 1541 100 %     Weight 03/22/17 1539 297 lb (134.7 kg)     Height 03/22/17 1539 5' 3.75" (1.619 m)     Pain Score 03/22/17 1539 10   Constitutional: Alert and oriented. Well appearing and in no acute distress. Eyes: Conjunctivae are normal. Head: Atraumatic. Nose: No congestion/rhinnorhea. Mouth/Throat: Mucous membranes are moist.  Oropharynx non-erythematous. Neck: No stridor.  Cardiovascular: Normal rate, regular rhythm. Good peripheral circulation. Grossly normal heart sounds.   Respiratory: Normal respiratory effort.  No retractions. Lungs CTAB. Gastrointestinal: Soft and nontender. No distention.  Musculoskeletal: No lower extremity tenderness nor edema. No gross deformities of extremities. No tenderness to palpation of the thoracic or lumbar spine.  Neurologic:  Normal speech and language. No gross focal neurologic deficits are appreciated. 2+ patellar reflexes bilaterally. Normal  strength and sensation in the LE.  Skin:  Skin is warm, dry and intact. No rash noted.  ____________________________________________   LABS (all labs ordered are listed, but only abnormal results are displayed)  Labs Reviewed  URINALYSIS, ROUTINE W REFLEX MICROSCOPIC - Abnormal; Notable for the following components:      Result Value   Specific Gravity, Urine >1.030 (*)    All other components within normal limits  PREGNANCY, URINE   ____________________________________________  RADIOLOGY  None ____________________________________________   PROCEDURES  Procedure(s) performed:   Procedures  None ____________________________________________   INITIAL IMPRESSION / ASSESSMENT AND PLAN / ED COURSE  Pertinent labs & imaging results that were available during my care of the patient were reviewed by me and considered in my medical decision making (see chart for details).  Patient presents emergency department for evaluation of lower back discomfort.  Symptoms are most consistent with a sciatica type presentation.  Normal  reflexes.  Normal sensation and strength in the lower extremities.  No red flag symptoms to increase suspicion for central process or developing cauda equina.  No indication for imaging at this time.  Plan for treatment of sciatica type pain.  We will also obtain urinalysis.   UA negative. Suspect MSK etiology. Valium improved pain. Patient ambulatory. Plan for discharge home at this time.   At this time, I do not feel there is any life-threatening condition present. I have reviewed and discussed all results (EKG, imaging, lab, urine as appropriate), exam findings with patient. I have reviewed nursing notes and appropriate previous records.  I feel the patient is safe to be discharged home without further emergent workup. Discussed usual and customary return precautions. Patient and family (if present) verbalize understanding and are comfortable with this plan.   Patient will follow-up with their primary care provider. If they do not have a primary care provider, information for follow-up has been provided to them. All questions have been answered.  ____________________________________________  FINAL CLINICAL IMPRESSION(S) / ED DIAGNOSES  Final diagnoses:  Acute left-sided low back pain with left-sided sciatica     MEDICATIONS GIVEN DURING THIS VISIT:  Medications  ketorolac (TORADOL) 30 MG/ML injection 30 mg (30 mg Intramuscular Given 03/22/17 1647)  diazepam (VALIUM) injection 5 mg (5 mg Intramuscular Given 03/22/17 1645)     NEW OUTPATIENT MEDICATIONS STARTED DURING THIS VISIT:  Valium   Note:  This document was prepared using Dragon voice recognition software and may include unintentional dictation errors.  Alona BeneJoshua Loye Reininger, MD Emergency Medicine    Ashey Tramontana, Arlyss RepressJoshua G, MD 03/22/17 [phone]

## 2017-04-03 NOTE — Progress Notes (Signed)
Need orders in epic for 1-16 surgery, pre op is 1-8

## 2017-04-03 NOTE — Patient Instructions (Addendum)
Lindsey Nicholson  04/03/2017   Your procedure is scheduled on: 04-15-17  [facility]  Report to admitting at 1115 AM  Call this number if you have problems the morning of surgery [phone]   Remember: Do not eat food  :After Midnight. May have clear liquids from midnight until 715 am- nothing by mouth after 715 am morning of surgery.      CLEAR LIQUID DIET   Foods Allowed                                                                     Foods Excluded  Coffee and tea, regular and decaf                             liquids that you cannot  Plain Jell-O in any flavor                                             see through such as: Fruit ices (not with fruit pulp)                                     milk, soups, orange juice  Iced Popsicles                                    All solid food Carbonated beverages, regular and diet                                    Cranberry, grape and apple juices Sports drinks like Gatorade Lightly seasoned clear broth or consume(fat free) Sugar, honey syrup  Sample Menu Breakfast                                Lunch                                     Supper Cranberry juice                    Beef broth                            Chicken broth Jell-O                                     Grape juice                           Apple juice Coffee or tea  Jell-O                                      Popsicle                                                Coffee or tea                        Coffee or tea  _____________________________________________________________________     Take these medicines the morning of surgery with A SIP OF WATER: DIVALPROEX(DEPAKOTE ER), DULOXETINE (CYMBALTA), HYDROXYZINE (ATARAX), AMANTADINE(SYMMETREL), APOPRAZOLE(ABILITY), LEVOTHYROXINE (OEMPRAZOLE) BUPROPION (WELLBUTRIN)                               You may not have any metal on your body  including hair pins and              piercings  Do not wear jewelry, make-up, lotions, powders or perfumes, deodorant             Do not wear nail polish.  Do not shave  48 hours prior to surgery.              Men may shave face and neck.   Do not bring valuables to the hospital. [place] IS NOT             RESPONSIBLE   FOR VALUABLES.  Contacts, dentures or bridgework may not be worn into surgery.  Leave suitcase in the car. After surgery it may be brought to your room.     Patients discharged the day of surgery will not be allowed to drive home.  Name and phone number of your driver: DRIVER MARGRETE DELUDE CELL [phone]  Special Instructions: N/A              Please read over the following fact sheets you were given: _____________________________________________________________________             [facility] - Preparing for Surgery Before surgery, you can play an important role.  Because skin is not sterile, your skin needs to be as free of germs as possible.  You can reduce the number of germs on your skin by washing with CHG (chlorahexidine gluconate) soap before surgery.  CHG is an antiseptic cleaner which kills germs and bonds with the skin to continue killing germs even after washing. Please DO NOT use if you have an allergy to CHG or antibacterial soaps.  If your skin becomes reddened/irritated stop using the CHG and inform your nurse when you arrive at Short Stay. Do not shave (including legs and underarms) for at least 48 hours prior to the first CHG shower.  You may shave your face/neck. Please follow these instructions carefully:  1.  Shower with CHG Soap the night before surgery and the  morning of Surgery.  2.  If you choose to wash your hair, wash your hair first as usual with your  normal  shampoo.  3.  After you shampoo, rinse your hair and body thoroughly to remove the  shampoo.  4.  Use CHG as you would any other liquid soap.  You  can apply chg directly  to the skin and wash                       Gently with a scrungie or clean washcloth.  5.  Apply the CHG Soap to your body ONLY FROM THE NECK DOWN.   Do not use on face/ open                           Wound or open sores. Avoid contact with eyes, ears mouth and genitals (private parts).                       Wash face,  Genitals (private parts) with your normal soap.             6.  Wash thoroughly, paying special attention to the area where your surgery  will be performed.  7.  Thoroughly rinse your body with warm water from the neck down.  8.  DO NOT shower/wash with your normal soap after using and rinsing off  the CHG Soap.                9.  Pat yourself dry with a clean towel.            10.  Wear clean pajamas.            11.  Place clean sheets on your bed the night of your first shower and do not  sleep with pets. Day of Surgery : Do not apply any lotions/deodorants the morning of surgery.  Please wear clean clothes to the hospital/surgery center.  FAILURE TO FOLLOW THESE INSTRUCTIONS MAY RESULT IN THE CANCELLATION OF YOUR SURGERY PATIENT SIGNATURE_________________________________  NURSE SIGNATURE__________________________________  ________________________________________________________________________

## 2017-04-07 ENCOUNTER — Encounter (HOSPITAL_COMMUNITY)
Admission: RE | Admit: 2017-04-07 | Discharge: 2017-04-07 | Disposition: A | Discharge status: Home / Self Care | Payer: Medicare Other | Source: Ambulatory Visit | Attending: Orthopedic Surgery | Admitting: Orthopedic Surgery

## 2017-04-07 ENCOUNTER — Encounter (HOSPITAL_COMMUNITY): Payer: Self-pay | Admitting: *Deleted

## 2017-04-07 ENCOUNTER — Other Ambulatory Visit: Payer: Self-pay

## 2017-04-07 DIAGNOSIS — M549 Dorsalgia, unspecified: Secondary | ICD-10-CM | POA: Diagnosis not present

## 2017-04-07 DIAGNOSIS — Z809 Family history of malignant neoplasm, unspecified: Secondary | ICD-10-CM | POA: Diagnosis not present

## 2017-04-07 DIAGNOSIS — E039 Hypothyroidism, unspecified: Secondary | ICD-10-CM | POA: Diagnosis not present

## 2017-04-07 DIAGNOSIS — Z818 Family history of other mental and behavioral disorders: Secondary | ICD-10-CM | POA: Insufficient documentation

## 2017-04-07 DIAGNOSIS — Z79891 Long term (current) use of opiate analgesic: Secondary | ICD-10-CM | POA: Insufficient documentation

## 2017-04-07 DIAGNOSIS — Z811 Family history of alcohol abuse and dependence: Secondary | ICD-10-CM | POA: Insufficient documentation

## 2017-04-07 DIAGNOSIS — Z9889 Other specified postprocedural states: Secondary | ICD-10-CM | POA: Diagnosis not present

## 2017-04-07 DIAGNOSIS — K219 Gastro-esophageal reflux disease without esophagitis: Secondary | ICD-10-CM | POA: Insufficient documentation

## 2017-04-07 DIAGNOSIS — Z833 Family history of diabetes mellitus: Secondary | ICD-10-CM | POA: Insufficient documentation

## 2017-04-07 DIAGNOSIS — F17210 Nicotine dependence, cigarettes, uncomplicated: Secondary | ICD-10-CM | POA: Insufficient documentation

## 2017-04-07 DIAGNOSIS — F319 Bipolar disorder, unspecified: Secondary | ICD-10-CM | POA: Diagnosis not present

## 2017-04-07 DIAGNOSIS — Z01812 Encounter for preprocedural laboratory examination: Secondary | ICD-10-CM | POA: Diagnosis not present

## 2017-04-07 HISTORY — DX: Prediabetes: R73.03

## 2017-04-07 HISTORY — DX: Morbid (severe) obesity due to excess calories: E66.01

## 2017-04-07 LAB — COMPREHENSIVE METABOLIC PANEL
ALT: 34 U/L (ref 14–54)
AST: 29 U/L (ref 15–41)
Albumin: 3.8 g/dL (ref 3.5–5.0)
Alkaline Phosphatase: 76 U/L (ref 38–126)
Anion gap: 9 (ref 5–15)
BILIRUBIN TOTAL: 0.6 mg/dL (ref 0.3–1.2)
BUN: 13 mg/dL (ref 6–20)
CALCIUM: 9 mg/dL (ref 8.9–10.3)
CO2: 27 mmol/L (ref 22–32)
CREATININE: 0.93 mg/dL (ref 0.44–1.00)
Chloride: 101 mmol/L (ref 101–111)
GFR calc Af Amer: >60 mL/min (ref >60)
Glucose, Bld: 139 mg/dL — ABNORMAL HIGH (ref 65–99)
POTASSIUM: 3.9 mmol/L (ref 3.5–5.1)
Sodium: 137 mmol/L (ref 135–145)
TOTAL PROTEIN: 7.4 g/dL (ref 6.5–8.1)

## 2017-04-07 LAB — CBC
HCT: 42.5 % (ref 36.0–46.0)
Hemoglobin: 14 g/dL (ref 12.0–15.0)
MCH: 29.7 pg (ref 26.0–34.0)
MCHC: 32.9 g/dL (ref 30.0–36.0)
MCV: 90 fL (ref 78.0–100.0)
PLATELETS: 326 10*3/uL (ref 150–400)
RBC: 4.72 MIL/uL (ref 3.87–5.11)
RDW: 13.4 % (ref 11.5–15.5)
WBC: 8.5 10*3/uL (ref 4.0–10.5)

## 2017-04-07 LAB — HEMOGLOBIN A1C
HEMOGLOBIN A1C: 5.9 % — AB (ref 4.8–5.6)
Mean Plasma Glucose: 122.63 mg/dL

## 2017-04-07 NOTE — Progress Notes (Signed)
Urine pregnancy 03-22-17 epic

## 2017-04-08 NOTE — Progress Notes (Signed)
Orders have been requested x 5 for srugery on 04/15/17.

## 2017-04-14 MED ORDER — DEXTROSE 5 % IV SOLN
3.0000 g | INTRAVENOUS | Status: AC
  Administered 2017-04-15: 3 g via INTRAVENOUS
  Filled 2017-04-14: qty 3

## 2017-04-15 ENCOUNTER — Encounter (HOSPITAL_COMMUNITY): Payer: Self-pay | Admitting: *Deleted

## 2017-04-15 ENCOUNTER — Ambulatory Visit (HOSPITAL_COMMUNITY)
Admission: RE | Admit: 2017-04-15 | Discharge: 2017-04-15 | Disposition: A | Discharge status: Home / Self Care | Payer: Medicare Other | Source: Ambulatory Visit | Attending: Orthopedic Surgery | Admitting: Orthopedic Surgery

## 2017-04-15 ENCOUNTER — Ambulatory Visit (HOSPITAL_COMMUNITY): Payer: Medicare Other | Admitting: Registered Nurse

## 2017-04-15 ENCOUNTER — Encounter (HOSPITAL_COMMUNITY)
Admission: RE | Disposition: A | Discharge status: Home / Self Care | Payer: Self-pay | Source: Ambulatory Visit | Attending: Orthopedic Surgery

## 2017-04-15 DIAGNOSIS — X58XXXA Exposure to other specified factors, initial encounter: Secondary | ICD-10-CM | POA: Insufficient documentation

## 2017-04-15 DIAGNOSIS — E039 Hypothyroidism, unspecified: Secondary | ICD-10-CM | POA: Insufficient documentation

## 2017-04-15 DIAGNOSIS — E2839 Other primary ovarian failure: Secondary | ICD-10-CM | POA: Diagnosis not present

## 2017-04-15 DIAGNOSIS — F419 Anxiety disorder, unspecified: Secondary | ICD-10-CM | POA: Diagnosis not present

## 2017-04-15 DIAGNOSIS — Z79899 Other long term (current) drug therapy: Secondary | ICD-10-CM | POA: Diagnosis not present

## 2017-04-15 DIAGNOSIS — F603 Borderline personality disorder: Secondary | ICD-10-CM | POA: Insufficient documentation

## 2017-04-15 DIAGNOSIS — Z6841 Body Mass Index (BMI) 40.0 and over, adult: Secondary | ICD-10-CM | POA: Insufficient documentation

## 2017-04-15 DIAGNOSIS — F319 Bipolar disorder, unspecified: Secondary | ICD-10-CM | POA: Diagnosis not present

## 2017-04-15 DIAGNOSIS — K219 Gastro-esophageal reflux disease without esophagitis: Secondary | ICD-10-CM | POA: Diagnosis not present

## 2017-04-15 DIAGNOSIS — Y929 Unspecified place or not applicable: Secondary | ICD-10-CM | POA: Diagnosis not present

## 2017-04-15 DIAGNOSIS — F17210 Nicotine dependence, cigarettes, uncomplicated: Secondary | ICD-10-CM | POA: Insufficient documentation

## 2017-04-15 DIAGNOSIS — S83282A Other tear of lateral meniscus, current injury, left knee, initial encounter: Secondary | ICD-10-CM | POA: Insufficient documentation

## 2017-04-15 DIAGNOSIS — M65862 Other synovitis and tenosynovitis, left lower leg: Secondary | ICD-10-CM | POA: Insufficient documentation

## 2017-04-15 DIAGNOSIS — M222X2 Patellofemoral disorders, left knee: Secondary | ICD-10-CM | POA: Diagnosis present

## 2017-04-15 DIAGNOSIS — R7303 Prediabetes: Secondary | ICD-10-CM | POA: Insufficient documentation

## 2017-04-15 HISTORY — PX: KNEE ARTHROSCOPY WITH MEDIAL MENISECTOMY: SHX5651

## 2017-04-15 SURGERY — ARTHROSCOPY, KNEE, WITH MEDIAL MENISCECTOMY
Anesthesia: General | Site: Knee | Laterality: Left

## 2017-04-15 MED ORDER — FENTANYL CITRATE (PF) 250 MCG/5ML IJ SOLN
INTRAMUSCULAR | Status: AC
  Filled 2017-04-15: qty 5

## 2017-04-15 MED ORDER — ONDANSETRON HCL 4 MG/2ML IJ SOLN
INTRAMUSCULAR | Status: DC | PRN
  Administered 2017-04-15: 4 mg via INTRAVENOUS

## 2017-04-15 MED ORDER — LIDOCAINE 2% (20 MG/ML) 5 ML SYRINGE
INTRAMUSCULAR | Status: AC
  Filled 2017-04-15: qty 5

## 2017-04-15 MED ORDER — DEXAMETHASONE SODIUM PHOSPHATE 10 MG/ML IJ SOLN
INTRAMUSCULAR | Status: AC
  Filled 2017-04-15: qty 1

## 2017-04-15 MED ORDER — FENTANYL CITRATE (PF) 100 MCG/2ML IJ SOLN
INTRAMUSCULAR | Status: DC | PRN
  Administered 2017-04-15 (×5): 50 ug via INTRAVENOUS

## 2017-04-15 MED ORDER — ACETAMINOPHEN 10 MG/ML IV SOLN
INTRAVENOUS | Status: DC | PRN
  Administered 2017-04-15: 1000 mg via INTRAVENOUS

## 2017-04-15 MED ORDER — ONDANSETRON HCL 4 MG/2ML IJ SOLN
INTRAMUSCULAR | Status: AC
  Filled 2017-04-15: qty 2

## 2017-04-15 MED ORDER — PROPOFOL 10 MG/ML IV BOLUS
INTRAVENOUS | Status: AC
Start: 2017-04-15 — End: ?
  Filled 2017-04-15: qty 20

## 2017-04-15 MED ORDER — PROPOFOL 10 MG/ML IV BOLUS
INTRAVENOUS | Status: DC | PRN
  Administered 2017-04-15: 200 mg via INTRAVENOUS

## 2017-04-15 MED ORDER — LACTATED RINGERS IV SOLN
INTRAVENOUS | Status: DC | PRN
  Administered 2017-04-15 (×2): via INTRAVENOUS

## 2017-04-15 MED ORDER — IBUPROFEN 600 MG PO TABS: 600.0000 mg | ORAL_TABLET | Freq: Four times a day (QID) | ORAL | 1 refills | Status: DC | PRN

## 2017-04-15 MED ORDER — BUPIVACAINE HCL (PF) 0.25 % IJ SOLN
INTRAMUSCULAR | Status: AC
  Filled 2017-04-15: qty 30

## 2017-04-15 MED ORDER — MIDAZOLAM HCL 5 MG/5ML IJ SOLN
INTRAMUSCULAR | Status: DC | PRN
  Administered 2017-04-15: 2 mg via INTRAVENOUS

## 2017-04-15 MED ORDER — ACETAMINOPHEN 10 MG/ML IV SOLN
INTRAVENOUS | Status: AC
  Filled 2017-04-15: qty 100

## 2017-04-15 MED ORDER — MIDAZOLAM HCL 2 MG/2ML IJ SOLN
INTRAMUSCULAR | Status: AC
  Filled 2017-04-15: qty 2

## 2017-04-15 MED ORDER — LIDOCAINE HCL (CARDIAC) 10 MG/ML IV SOLN
INTRAVENOUS | Status: DC | PRN
  Administered 2017-04-15: 100 mg via INTRAVENOUS

## 2017-04-15 MED ORDER — KETOROLAC TROMETHAMINE 30 MG/ML IJ SOLN
INTRAMUSCULAR | Status: DC | PRN
  Administered 2017-04-15: 30 mg via INTRAVENOUS

## 2017-04-15 MED ORDER — CHLORHEXIDINE GLUCONATE 4 % EX LIQD
60.0000 mL | Freq: Once | CUTANEOUS | Status: AC
  Administered 2017-04-15: 4 via TOPICAL

## 2017-04-15 MED ORDER — LACTATED RINGERS IR SOLN
Status: DC | PRN
  Administered 2017-04-15: 6000 mL

## 2017-04-15 MED ORDER — METHOCARBAMOL 500 MG PO TABS
ORAL_TABLET | ORAL | Status: AC
  Administered 2017-04-15: 500 mg
  Filled 2017-04-15: qty 1

## 2017-04-15 MED ORDER — METHOCARBAMOL 500 MG PO TABS: 500.0000 mg | ORAL_TABLET | Freq: Four times a day (QID) | ORAL | 1 refills | Status: DC | PRN

## 2017-04-15 MED ORDER — KETOROLAC TROMETHAMINE 30 MG/ML IJ SOLN
INTRAMUSCULAR | Status: AC
  Filled 2017-04-15: qty 1

## 2017-04-15 MED ORDER — 0.9 % SODIUM CHLORIDE (POUR BTL) OPTIME
TOPICAL | Status: DC | PRN
  Administered 2017-04-15: 1000 mL

## 2017-04-15 MED ORDER — BUPIVACAINE HCL (PF) 0.25 % IJ SOLN
INTRAMUSCULAR | Status: DC | PRN
  Administered 2017-04-15: 30 mL

## 2017-04-15 MED ORDER — LACTATED RINGERS IV SOLN
INTRAVENOUS | Status: DC
  Administered 2017-04-15: 12:00:00 via INTRAVENOUS

## 2017-04-15 MED ORDER — DEXAMETHASONE SODIUM PHOSPHATE 10 MG/ML IJ SOLN
INTRAMUSCULAR | Status: DC | PRN
  Administered 2017-04-15: 10 mg via INTRAVENOUS

## 2017-04-15 MED ORDER — FENTANYL CITRATE (PF) 100 MCG/2ML IJ SOLN: 25.0000 ug | INTRAMUSCULAR | Status: DC | PRN

## 2017-04-15 SURGICAL SUPPLY — 33 items
BANDAGE ACE 6X5 VEL STRL LF (GAUZE/BANDAGES/DRESSINGS) ×3 IMPLANT
BLADE CUDA SHAVER 3.5 (BLADE) ×3 IMPLANT
BLADE SURG SZ11 CARB STEEL (BLADE) ×3 IMPLANT
CLOTH BEACON ORANGE TIMEOUT ST (SAFETY) ×3 IMPLANT
COUNTER NEEDLE 20 DBL MAG RED (NEEDLE) ×3 IMPLANT
COVER SURGICAL LIGHT HANDLE (MISCELLANEOUS) ×3 IMPLANT
DRAPE U-SHAPE 47X51 STRL (DRAPES) ×3 IMPLANT
DRSG EMULSION OIL 3X3 NADH (GAUZE/BANDAGES/DRESSINGS) ×3 IMPLANT
DURAPREP 26ML APPLICATOR (WOUND CARE) ×3 IMPLANT
GAUZE SPONGE 4X4 12PLY STRL (GAUZE/BANDAGES/DRESSINGS) ×3 IMPLANT
GAUZE SPONGE 4X4 16PLY XRAY LF (GAUZE/BANDAGES/DRESSINGS) ×3 IMPLANT
GLOVE BIOGEL PI IND STRL 7.0 (GLOVE) ×2 IMPLANT
GLOVE BIOGEL PI IND STRL 7.5 (GLOVE) ×1 IMPLANT
GLOVE BIOGEL PI INDICATOR 7.0 (GLOVE) ×4
GLOVE BIOGEL PI INDICATOR 7.5 (GLOVE) ×2
GLOVE ORTHO TXT STRL SZ7.5 (GLOVE) ×3 IMPLANT
GOWN STRL REUS W/TWL LRG LVL3 (GOWN DISPOSABLE) ×3 IMPLANT
GOWN STRL REUS W/TWL XL LVL3 (GOWN DISPOSABLE) ×3 IMPLANT
KIT BASIN OR (CUSTOM PROCEDURE TRAY) ×3 IMPLANT
LEGGING LITHOTOMY PAIR STRL (DRAPES) ×3 IMPLANT
MANIFOLD NEPTUNE II (INSTRUMENTS) ×3 IMPLANT
MARKER PEN SURG W/LABELS BLK (STERILIZATION PRODUCTS) ×3 IMPLANT
PACK ARTHROSCOPY WL (CUSTOM PROCEDURE TRAY) ×3 IMPLANT
PAD ABD 8X10 STRL (GAUZE/BANDAGES/DRESSINGS) ×3 IMPLANT
PAD MASON LEG HOLDER (PIN) ×3 IMPLANT
PADDING CAST COTTON 6X4 STRL (CAST SUPPLIES) ×3 IMPLANT
SHAVER 4.2 MM LANZA 9391A (BLADE) ×3 IMPLANT
SUT ETHILON 4 0 PS 2 18 (SUTURE) ×3 IMPLANT
SYR 30ML LL (SYRINGE) ×3 IMPLANT
TOWEL OR 17X26 10 PK STRL BLUE (TOWEL DISPOSABLE) ×3 IMPLANT
TUBING ARTHRO INFLOW-ONLY STRL (TUBING) ×3 IMPLANT
WAND HAND CNTRL MULTIVAC 50 (MISCELLANEOUS) ×3 IMPLANT
WRAP KNEE MAXI GEL POST OP (GAUZE/BANDAGES/DRESSINGS) ×3 IMPLANT

## 2017-04-15 NOTE — Anesthesia Preprocedure Evaluation (Addendum)
Anesthesia Evaluation  Patient identified by MRN, date of birth, ID band Patient awake    Reviewed: Allergy & Precautions, H&P , NPO status , Patient's Chart, lab work & pertinent test results  Airway Mallampati: III  TM Distance: >3 FB Neck ROM: Full    Dental no notable dental hx. (+) Teeth Intact, Dental Advisory Given   Pulmonary Current Smoker,    Pulmonary exam normal breath sounds clear to auscultation       Cardiovascular negative cardio ROS   Rhythm:Regular Rate:Normal     Neuro/Psych PSYCHIATRIC DISORDERS Anxiety Depression Bipolar Disorder negative neurological ROS     GI/Hepatic Neg liver ROS, GERD  ,  Endo/Other  Hypothyroidism Morbid obesity  Renal/GU negative Renal ROS  negative genitourinary   Musculoskeletal   Abdominal   Peds  Hematology negative hematology ROS (+)   Anesthesia Other Findings   Reproductive/Obstetrics negative OB ROS                            Anesthesia Physical Anesthesia Plan  ASA: III  Anesthesia Plan: General   Post-op Pain Management:    Induction: Intravenous  PONV Risk Score and Plan: 3 and Ondansetron, Dexamethasone and Midazolam  Airway Management Planned: LMA  Additional Equipment:   Intra-op Plan:   Post-operative Plan: Extubation in OR  Informed Consent: I have reviewed the patients History and Physical, chart, labs and discussed the procedure including the risks, benefits and alternatives for the proposed anesthesia with the patient or authorized representative who has indicated his/her understanding and acceptance.   Dental advisory given  Plan Discussed with: CRNA  Anesthesia Plan Comments:         Anesthesia Quick Evaluation

## 2017-04-15 NOTE — Anesthesia Procedure Notes (Signed)
Procedure Name: LMA Insertion Date/Time: 04/15/2017 1:19 PM Performed by: Illene SilverEvans, Saory Carriero E, CRNA Pre-anesthesia Checklist: Patient identified, Emergency Drugs available, Suction available and Patient being monitored Patient Re-evaluated:Patient Re-evaluated prior to induction Oxygen Delivery Method: Circle system utilized Preoxygenation: Pre-oxygenation with 100% oxygen Induction Type: IV induction LMA: LMA with gastric port inserted LMA Size: 4.0 Tube type: Oral Number of attempts: 1 Airway Equipment and Method: Oral airway Placement Confirmation: positive ETCO2 Tube secured with: Tape Dental Injury: Teeth and Oropharynx as per pre-operative assessment

## 2017-04-15 NOTE — Discharge Instructions (Addendum)
Knee Arthroscopy, Care After Refer to this sheet in the next few weeks. These instructions provide you with information about caring for yourself after your procedure. Your health care provider may also give you more specific instructions. Your treatment has been planned according to current medical practices, but problems sometimes occur. Call your health care provider if you have any problems or questions after your procedure. What can I expect after the procedure? After the procedure, it is common to have:  Soreness.  Pain.  Follow these instructions at home: Bathing  Do not take baths, swim, or use a hot tub until your health care provider approves. Incision care  There are many different ways to close and cover an incision, including stitches, skin glue, and adhesive strips. Follow your health care providers instructions about: ? Incision care. ? Bandage (dressing) changes and removal. ? Incision closure removal.  Check your incision area every day for signs of infection. Watch for: ? Redness, swelling, or pain. ? Fluid, blood, or pus. Activity  Avoid strenuous activities for as long as directed by your health care provider.  Return to your normal activities as directed by your health care provider. Ask your health care provider what activities are safe for you.  Perform range-of-motion exercises only as directed by your health care provider.  Do not lift anything that is heavier than 10 lb (4.5 kg).  Do not drive or operate heavy machinery while taking pain medicine.  If you were given crutches, use them as directed by your health care provider. Managing pain, stiffness, and swelling  If directed, apply ice to the injured area: ? Put ice in a plastic bag. ? Place a towel between your skin and the bag. ? Leave the ice on for 20 minutes, 2-3 times per day.  Raise the injured area above the level of your heart while you are sitting or lying down as directed by your  health care provider. General instructions  Keep all follow-up visits as directed by your health care provider. This is important.  Take medicines only as directed by your health care provider.  Do not use any tobacco products, including cigarettes, chewing tobacco, or electronic cigarettes. If you need help quitting, ask your health care provider.  If you were given compression stockings, wear them as directed by your health care provider. These stockings help prevent blood clots and reduce swelling in your legs. Contact a health care provider if:  You have severe pain with any movement of your knee.  You notice a bad smell coming from the incision or dressing.  You have redness, swelling, or pain at the site of your incision.  You have fluid, blood, or pus coming from your incision. Get help right away if:  You develop a rash.  You have a fever.  You have difficulty breathing or have shortness of breath.  You develop pain in your calves or in the back of your knee.  You develop chest pain.  You develop numbness or tingling in your leg or foot. This information is not intended to replace advice given to you by your health care provider. Make sure you discuss any questions you have with your health care provider. Document Released: 10/04/2004 Document Revised: 08/17/2015 Document Reviewed: 03/13/2014 Elsevier Interactive Patient Education  2018 [employer].    Orthopedic discharge instructions:  -Okay for full weightbearing as tolerated to the left lower extremity.  Use your walker for weightbearing assistance until you have functional recovery of the knee. -  He will begin physical therapy within 1 week to work on rehabbing the knee. -Apply ice to the knee for 20-30 minutes out of every hour. -Maintain your postoperative bandage for 3 days.  On the fourth day postoperatively you may remove and begin showering at that time.  Cover wounds with Band-Aids. -For mild pain take  Tylenol as needed.  For moderate pain take prescription strength ibuprofen as directed.  For muscle spasms use Robaxin as directed. -For prevention of blood clots take an 81 mg aspirin twice daily for 6 weeks. -Return to see Dr. Aundria Rudogers in 2-3 weeks for suture removal.

## 2017-04-15 NOTE — Brief Op Note (Signed)
04/15/2017  2:19 PM  PATIENT:  Lindsey Nicholson  47 y.o. female  PRE-OPERATIVE DIAGNOSIS:  Left knee lateral and medial meniscus tears  POST-OPERATIVE DIAGNOSIS:  Left knee lateral and medial meniscus tears  PROCEDURE:  Procedure(s): Left knee arthroscopic partial medial, lateral and partial menisectomies, and lateral release (Left)  SURGEON:  Surgeon(s) and Role:    * Yolonda Kidaogers, Harley Mccartney Patrick, MD - Primary  PHYSICIAN ASSISTANT:   ASSISTANTS: none   ANESTHESIA:   general  EBL:  5 mL   BLOOD ADMINISTERED:none  DRAINS: none   LOCAL MEDICATIONS USED:  MARCAINE     SPECIMEN:  No Specimen  DISPOSITION OF SPECIMEN:  N/A  COUNTS:  YES  TOURNIQUET:  * No tourniquets in log *  DICTATION: .Note written in EPIC  PLAN OF CARE: Discharge to home after PACU  PATIENT DISPOSITION:  PACU - hemodynamically stable.   Delay start of Pharmacological VTE agent (>24hrs) due to surgical blood loss or risk of bleeding: not applicable

## 2017-04-15 NOTE — Anesthesia Postprocedure Evaluation (Signed)
Anesthesia Post Note  Patient: Solon PalmMichelle A Tillett  Procedure(s) Performed: Left knee arthroscopic partial medial, lateral and partial menisectomies with lateral release (Left Knee)     Patient location during evaluation: PACU Anesthesia Type: General Level of consciousness: awake and alert Pain management: pain level controlled Vital Signs Assessment: post-procedure vital signs reviewed and stable Respiratory status: spontaneous breathing, nonlabored ventilation and respiratory function stable Cardiovascular status: blood pressure returned to baseline and stable Postop Assessment: no apparent nausea or vomiting Anesthetic complications: no    Last Vitals:  Vitals:   04/15/17 1519 04/15/17 1615  BP: 107/76 118/78  Pulse: 90 89  Resp: 16 16  Temp: 36.4 C   SpO2: 96% 96%    Last Pain:  Vitals:   04/15/17 1615  TempSrc:   PainSc: 6                  Kimmie Doren,W. EDMOND

## 2017-04-15 NOTE — Op Note (Signed)
Surgery Date: 04/15/2017  Surgeon(s): Yolonda Kidaogers, Cammie Faulstich Patrick, MD  ANESTHESIA:  general  FLUIDS: Per anesthesia record.   ESTIMATED BLOOD LOSS: minimal  PREOPERATIVE DIAGNOSES:  1.  Left knee lateral meniscus tear 2.  knee synovitis 3. Left knee patellofemoral disorder  POSTOPERATIVE DIAGNOSES:  same  PROCEDURES PERFORMED:  1. Left knee arthroscopy with limited synovectomy 2. knee arthroscopy with arthroscopic partial lateral meniscectomy 3. knee arthroscopy with arthroscopic chondroplasty medial femoral condyle and medial tibial plateau, and Trochlea 4. Left knee lateral release  DESCRIPTION OF PROCEDURE: [name] is a 47 y.o.-year-old female with left knee lateral meniscus tear. Plans are to proceed with partial lateral meniscectomy and diagnostic arthroscopy with debridement as indicated. Full discussion held regarding risks benefits alternatives and complications related surgical intervention. Conservative care options reviewed. All questions answered.  The patient was identified in the preoperative holding area and the operative extremity was marked. The patient was brought to the operating room and transferred to operating table in a supine position. Satisfactory general anesthesia was induced by anesthesiology.    Standard anterolateral, anteromedial arthroscopy portals were obtained. The anteromedial portal was obtained with a spinal needle for localization under direct visualization with subsequent diagnostic findings.   Anteromedial and anterolateral chambers: moderate synovitis. The synovitis was debrided with a 4.5 mm full radius shaver through both the anteromedial and lateral portals.  We did also utilize radiofrequency ablation device to maintain hemostasis.  Suprapatellar pouch and gutters: moderate synovitis or debris. Patella chondral surface: Grade 2 Trochlear chondral surface: Grade 3 centrally Patellofemoral tracking: Lateral tilt Medial meniscus: Intact, no  tear.  Medial femoral condyle flexion bearing surface: Grade 2 Medial femoral condyle extension bearing surface: Grade 0 Medial tibial plateau: Grade 0 Anterior cruciate ligament:stable Posterior cruciate ligament:stable Lateral meniscus: Partial peripheral tearing of the posterior root.  Peripheral tearing noted as well at the posterior horn.   Lateral femoral condyle flexion bearing surface: Grade 0 Lateral femoral condyle extension bearing surface: Grade 0 Lateral tibial plateau: Grade 0  Partial lateral meniscectomy was carried out with combination of straight baskets biters as well as rotary shaver.  Following the completion of partial meniscectomy that were smooth contoured edges with no free flaps.  Lateral release was carried out with radiofrequency ablation device working from the medial portal.  Retinaculum was released from the superior pole of the patella down to the level of the lateral viewing portal.  Hemostasis was achieved throughout the process utilizing electrocautery.  After completion of synovectomy, diagnostic exam, and debridements as described, all compartments were checked and no residual debris remained. Hemostasis was achieved with the cautery wand. The portals were approximated with buried monocryl. All excess fluid was expressed from the joint.  Xeroform sterile gauze dressings were applied followed by Ace bandage and ice pack.   DISPOSITION: The patient was awakened from general anesthetic, extubated, taken to the recovery room in medically stable condition, no apparent complications. The patient may be weightbearing as tolerated to the operative lower extremity.  Range of motion of right knee as tolerated.

## 2017-04-15 NOTE — H&P (Signed)
ORTHOPAEDIC H and P  REQUESTING PHYSICIAN: Yolonda Kida, MD  PCP:  Coralee Rud, PA-C  Chief Complaint: Left knee lateral meniscus tear.  HPI: Lindsey Nicholson is a 47 y.o. female who complains of recalcitrant left knee pain following conservative management with injections, physical therapy, and oral medications.  Her MRI demonstrated lateral meniscus pathology as well as lateral tilting of the patella.  We have recommended arthroscopic interventions with partial lateral and possible partial medial meniscectomy as well as likely lateral release.  We reviewed the risk, benefits, and indications of this procedure in the office.  She presents today for that surgery.  She has no new complaints at this time.  Past Medical History:  Diagnosis Date  . Anxiety   . Bipolar 1 disorder (HCC)   . Borderline personality disorder (HCC)   . Borderline personality disorder (HCC)   . Cervical dysplasia   . GERD (gastroesophageal reflux disease)   . History of vulvar dysplasia    vin I  --- s/p  wide excision 06-21-2012   . Hypothyroidism   . Moderate major depression (HCC)   . Morbid obesity (HCC)   . NAFL (nonalcoholic fatty liver)   . Pre-diabetes   . Premature ovarian failure   . Wears glasses    Past Surgical History:  Procedure Laterality Date  . CARPAL TUNNEL RELEASE Bilateral LEFT  2013//  RIGHT  JAN  2015  . CHOLECYSTECTOMY  2011  . HYSTEROSCOPY W/D&C  10/17/2010   Procedure: DILATATION AND CURETTAGE (D&C) /HYSTEROSCOPY;  Surgeon: Jessee Avers;  Location: [facility];  Service: Gynecology;  Laterality: N/A;  . HYSTEROSCOPY W/D&C N/A 06/21/2012   Procedure: DILATATION AND CURETTAGE /HYSTEROSCOPY;  Surgeon: [name], MD;  Location: [facility];  Service: Gynecology;  Laterality: N/A;  . LAPAROSCOPIC TUBAL LIGATION  01/09/2011   Procedure: LAPAROSCOPIC TUBAL LIGATION;  Surgeon: [name];  Location: [facility];  Service: Gynecology;  Laterality: Bilateral;  with [place]  clips  . LEEP  01/09/2011   Procedure: LOOP ELECTROSURGICAL EXCISION PROCEDURE (LEEP);  Surgeon: [name];  Location: [facility];  Service: Gynecology;  Laterality: N/A;  . LEEP N/A 09/16/2013   Procedure: LOOP ELECTROSURGICAL EXCISION PROCEDURE (LEEP);  Surgeon: [name], MD;  Location: [facility];  Service: Gynecology;  Laterality: N/A;  . NEGATIVE SLEEP STUDY  2014   per pt  . ORIF LEFT FOOT FX  2008   HARDWARE  REMOVED 6 MONTHS LATER  . STOMACH SURGERY  [facility] JULY 2018  . VULVECTOMY N/A 06/21/2012   Procedure: WIDE EXCISION VULVECTOMY;  Surgeon: [name], MD;  Location: [facility];  Service: Gynecology;  Laterality: N/A;  excision of vulvar lesions,  need colposcope   Social History   Socioeconomic History  . Marital status: Married    Spouse name: None  . Number of children: None  . Years of education: None  . Highest education level: None  Social Needs  . Financial resource strain: None  . Food insecurity - worry: None  . Food insecurity - inability: None  . Transportation needs - medical: None  . Transportation needs - non-medical: None  Occupational History  . None  Tobacco Use  . Smoking status: Current Every Day Smoker    Packs/day: 0.50    Years: 25.00    Pack years: 12.50    Types: Cigarettes  . Smokeless tobacco: Never Used  Substance and Sexual Activity  . Alcohol use: No  .  Drug use: No  . Sexual activity: Yes    Birth control/protection: Surgical  Other Topics Concern  . None  Social History Narrative  . None   Family History  Problem Relation Age of Onset  . Cancer Mother   . Mental illness Mother   . Alcohol abuse Mother   . Drug abuse Mother   . Bipolar disorder Sister   . Alcohol abuse Maternal Aunt   . Drug abuse Maternal Aunt   . Depression Maternal Aunt   . Schizophrenia Maternal Aunt   . Alcohol abuse Maternal Uncle   . Alcohol abuse Maternal Grandfather   . Diabetes Father     Allergies  Allergen Reactions  . Codeine Anaphylaxis  . Hydrocodone Anaphylaxis  . Oxycodone Anaphylaxis and Other (See Comments)    Percocet  . Trazodone And Nefazodone Other (See Comments)    Blurry vision   Prior to Admission medications   Medication Sig Start Date End Date Taking? Authorizing Provider  acetaminophen (TYLENOL) 650 MG CR tablet Take 1,300 mg by mouth every 8 (eight) hours as needed for pain.   Yes [provider]  amantadine (SYMMETREL) 100 MG capsule Take 100 mg by mouth 2 (two) times daily.   Yes [provider]  ARIPiprazole (ABILIFY) 10 MG tablet Take 10 mg by mouth at bedtime.   Yes [provider]  buPROPion (WELLBUTRIN XL) 150 MG 24 hr tablet Take 450 mg by mouth every morning.    Yes [provider]  divalproex (DEPAKOTE ER) 500 MG 24 hr tablet Take 500 mg by mouth 2 (two) times daily.    Yes [provider]  DULoxetine (CYMBALTA) 60 MG capsule Take 60 mg by mouth 2 (two) times daily.    Yes [provider]  hydrOXYzine (ATARAX/VISTARIL) 10 MG tablet Take 10 mg by mouth 2 (two) times daily.    Yes [provider]  levothyroxine (SYNTHROID, LEVOTHROID) 125 MCG tablet Take 250 mcg by mouth daily before breakfast.    Yes [provider]  omeprazole (PRILOSEC) 20 MG capsule Take 20 mg by mouth 2 (two) times daily.   Yes [provider]  zolpidem (AMBIEN) 10 MG tablet Take 10 mg by mouth at bedtime.    Yes [provider]  diazepam (VALIUM) 5 MG tablet Take 1 tablet (5 mg total) by mouth every 12 (twelve) hours as needed for muscle spasms. Patient not taking: Reported on 04/02/2017 03/22/17   Long, Arlyss Repress, MD  naproxen (NAPROSYN) 500 MG tablet Take 1 tablet (500 mg total) by mouth 2 (two) times daily. Patient not taking: Reported on 04/02/2017 01/13/17   Felicie Morn, NP  traMADol (ULTRAM) 50 MG tablet Take 1 tablet (50 mg total) by mouth every 8 (eight) hours as needed. Patient  not taking: Reported on 04/02/2017 07/02/16   Ward, Layla Maw, DO   No results found.  Positive ROS: All other systems have been reviewed and were otherwise negative with the exception of those mentioned in the HPI and as above.  Physical Exam: General: Alert, no acute distress Cardiovascular: No pedal edema Respiratory: No cyanosis, no use of accessory musculature GI: No organomegaly, abdomen is soft and non-tender Skin: No lesions in the area of chief complaint Neurologic: Sensation intact distally Psychiatric: Patient is competent for consent with normal mood and affect Lymphatic: No axillary or cervical lymphadenopathy    Assessment: 1.  Left knee lateral meniscus tear 2.  Patellofemoral syndrome left knee  Plan: Marcelino Duster, her husband,  and I again reviewed the risk, benefits, and indications of this procedure.  We will plan for arthroscopic intervention today.  Plan will be for discharge home from PACU.  She will be weightbearing as tolerated with assistive devices postoperatively.  She will begin physical therapy in 1 week. -All questions were solicited and answered to her satisfaction.  Informed consent was obtained from the patient.    Yolonda KidaJason Patrick Anel Purohit, MD Cell [phone]    04/15/2017 12:57 PM

## 2017-04-15 NOTE — Transfer of Care (Signed)
Immediate Anesthesia Transfer of Care Note  Patient: Lindsey Nicholson  Procedure(s) Performed: Left knee arthroscopic partial medial, lateral and partial menisectomies, and lateral release (Left Knee)  Patient Location: PACU  Anesthesia Type:General  Level of Consciousness: awake, alert , oriented and patient cooperative  Airway & Oxygen Therapy: Patient Spontanous Breathing and Patient connected to face mask oxygen  Post-op Assessment: Report given to RN, Post -op Vital signs reviewed and stable and Patient moving all extremities X 4  Post vital signs: stable  Last Vitals:  Vitals:   04/15/17 1119  BP: 119/78  Pulse: 90  Resp: 18  Temp: 36.9 C  SpO2: 98%    Last Pain:  Vitals:   04/15/17 1158  TempSrc:   PainSc: 3       Patients Stated Pain Goal: 5 (04/15/17 1158)  Complications: No apparent anesthesia complications

## 2017-05-14 ENCOUNTER — Emergency Department (HOSPITAL_BASED_OUTPATIENT_CLINIC_OR_DEPARTMENT_OTHER)
Admission: EM | Admit: 2017-05-14 | Discharge: 2017-05-14 | Disposition: A | Discharge status: Home / Self Care | Payer: Medicare Other | Attending: Emergency Medicine | Admitting: Emergency Medicine

## 2017-05-14 ENCOUNTER — Encounter (HOSPITAL_BASED_OUTPATIENT_CLINIC_OR_DEPARTMENT_OTHER): Payer: Self-pay

## 2017-05-14 ENCOUNTER — Other Ambulatory Visit: Payer: Self-pay

## 2017-05-14 DIAGNOSIS — R300 Dysuria: Secondary | ICD-10-CM | POA: Diagnosis present

## 2017-05-14 DIAGNOSIS — Z5321 Procedure and treatment not carried out due to patient leaving prior to being seen by health care provider: Secondary | ICD-10-CM | POA: Insufficient documentation

## 2017-05-14 LAB — URINALYSIS, ROUTINE W REFLEX MICROSCOPIC
Glucose, UA: NEGATIVE mg/dL
Ketones, ur: 15 mg/dL — AB
NITRITE: NEGATIVE
PH: 6 (ref 5.0–8.0)
Protein, ur: 100 mg/dL — AB
Specific Gravity, Urine: >1.03 — ABNORMAL HIGH (ref 1.005–1.030)

## 2017-05-14 LAB — URINALYSIS, MICROSCOPIC (REFLEX)

## 2017-05-14 LAB — PREGNANCY, URINE: Preg Test, Ur: NEGATIVE

## 2017-05-14 NOTE — ED Triage Notes (Signed)
C/o feels she has UTI-dysuria x 2 hours-NAD-steady slow gait

## 2017-05-17 LAB — URINE CULTURE: Culture: >=100000 — AB

## 2017-10-03 ENCOUNTER — Encounter (HOSPITAL_BASED_OUTPATIENT_CLINIC_OR_DEPARTMENT_OTHER): Payer: Self-pay | Admitting: Emergency Medicine

## 2017-10-03 ENCOUNTER — Other Ambulatory Visit: Payer: Self-pay

## 2017-10-03 ENCOUNTER — Emergency Department (HOSPITAL_BASED_OUTPATIENT_CLINIC_OR_DEPARTMENT_OTHER)
Admission: EM | Admit: 2017-10-03 | Discharge: 2017-10-03 | Disposition: A | Discharge status: Home / Self Care | Payer: Medicare Other | Attending: Emergency Medicine | Admitting: Emergency Medicine

## 2017-10-03 DIAGNOSIS — M545 Low back pain, unspecified: Secondary | ICD-10-CM

## 2017-10-03 DIAGNOSIS — Z79899 Other long term (current) drug therapy: Secondary | ICD-10-CM | POA: Insufficient documentation

## 2017-10-03 DIAGNOSIS — F17210 Nicotine dependence, cigarettes, uncomplicated: Secondary | ICD-10-CM | POA: Diagnosis not present

## 2017-10-03 MED ORDER — FENTANYL CITRATE (PF) 100 MCG/2ML IJ SOLN: 50.0000 ug | Freq: Once | INTRAMUSCULAR | Status: DC

## 2017-10-03 MED ORDER — METHOCARBAMOL 500 MG PO TABS
750.0000 mg | ORAL_TABLET | Freq: Once | ORAL | Status: AC
  Administered 2017-10-03: 750 mg via ORAL
  Filled 2017-10-03: qty 2

## 2017-10-03 MED ORDER — LIDOCAINE 5 % EX PTCH: 1.0000 | MEDICATED_PATCH | CUTANEOUS | 0 refills | Status: DC

## 2017-10-03 MED ORDER — FENTANYL CITRATE (PF) 100 MCG/2ML IJ SOLN
50.0000 ug | Freq: Once | INTRAMUSCULAR | Status: AC
  Administered 2017-10-03: 50 ug via INTRAMUSCULAR
  Filled 2017-10-03: qty 2

## 2017-10-03 MED ORDER — MELOXICAM 15 MG PO TABS: 15.0000 mg | ORAL_TABLET | Freq: Every day | ORAL | 0 refills | Status: DC

## 2017-10-03 MED ORDER — METHOCARBAMOL 500 MG PO TABS: 500.0000 mg | ORAL_TABLET | Freq: Every evening | ORAL | 0 refills | Status: DC | PRN

## 2017-10-03 NOTE — Discharge Instructions (Addendum)
You were seen here today for Back Pain: Low back pain is discomfort in the lower back that may be due to injuries to muscles and ligaments around the spine. Occasionally, it may be caused by a problem to a part of the spine called a disc. Your back pain should be treated with medicines listed below as well as back exercises and this back pain should get better over the next 2 weeks. Most patients get completely well in 4 weeks. It is important to know however, if you develop severe or worsening pain, low back pain with fever, numbness, weakness or inability to walk or urinate, you should return to the ER immediately.  Please follow up with your doctor this week for a recheck if still having symptoms.  HOME INSTRUCTIONS Self - care:  The application of heat can help soothe the pain.  Maintaining your daily activities, including walking (this is encouraged), as it will help you get better faster than just staying in bed. Do not life, push, pull anything more than 10 pounds for the next week. I am attaching back exercises that you can do at home to help facilitate your recovery.   Back Exercises - I have attached a handout on back exercises that can be done at home to help facilitate your recovery.   Medications are also useful to help with pain control.   Acetaminophen.  This medication is generally safe, and found over the counter. Take as directed for your age. You should not take more than 8 of the extra strength (500mg ) pills a day (max dose is 4000mg  total OVER one day)  Non steroidal anti inflammatory: This includes medications including Ibuprofen, naproxen and Mobic; These medications help both pain and swelling and are very useful in treating back pain.  They should be taken with food, as they can cause stomach upset, and more seriously, stomach bleeding. Do not combine the medications.  Muscle relaxants:  These medications can help with muscle tightness that is a cause of lower back pain.  Most of  these medications can cause drowsiness, and it is not safe to drive or use dangerous machinery while taking them. They are primarily helpful when taken at night before sleep.  You will need to follow up with your primary healthcare provider  or the Orthopedist in 1-2 weeks for reassessment and persistent symptoms.  Be aware that if you develop new symptoms, such as a fever, leg weakness, difficulty with or loss of control of your urine or bowels, abdominal pain, or more severe pain, you will need to seek medical attention and/or return to the Emergency department. Additional Information:  Your vital signs today were: BP 121/85 (BP Location: Right Arm)    Pulse (!) 110    Temp 98.4 F (36.9 C) (Oral)    Resp 18    Ht 5\' 4"  (1.626 m)    Wt (!) 147.4 kg (325 lb)    SpO2 100%    BMI 55.79 kg/m  If your blood pressure (BP) was elevated above 135/85 this visit, please have this repeated by your doctor within one month. ---------------

## 2017-10-03 NOTE — ED Notes (Signed)
Pt and FM given d/c instructions as per chart. Rx x 3 with precautions. Verbalizes understanding. No questions.

## 2017-10-03 NOTE — ED Notes (Signed)
ED Provider at bedside. 

## 2017-10-03 NOTE — ED Provider Notes (Signed)
[facility] EMERGENCY DEPARTMENT Provider Note   CSN: [id number] Arrival date & time: 10/03/17  2101     History   Chief Complaint Chief Complaint  Patient presents with  . Back Pain    HPI Lindsey Nicholson is a 47 y.o. female with a past medical history of morbid obesity, prediabetes who presents emergency department today for left lower back pain.  Patient notes that she woke this morning with left lower back pain that radiated into her left leg but did not extend past her left knee.  She reports the pain is constant and describes as a sharp, throbbing pain she currently rates as an 8/10.  The patient reports the pain is worse with movement, ambulation and palpation.  She notes that she has had the same in the past and it frequently occurs when she sleeps on a new bed as she did last night.  Patient notes she is taken ibuprofen for symptoms without any relief.  She denies history of prior back surgeries or sciatica in the past. Denies history of cancer, trauma, fever, night pain, IV drug use, recent spinal manipulation or procedures, upper back pain or neck pain, numbness/tingling/weakness of the lower extremities, urinary retention, loss of bowel/bladder function, saddle anesthesia, or unexplained weight loss. Denies dysuria, flank pain, suprapubic pain, frequency, urgency, or hematuria.   HPI  Past Medical History:  Diagnosis Date  . Anxiety   . Bipolar 1 disorder (HCC)   . Borderline personality disorder (HCC)   . Borderline personality disorder (HCC)   . Cervical dysplasia   . GERD (gastroesophageal reflux disease)   . History of vulvar dysplasia    vin I  --- s/p  wide excision 06-21-2012   . Hypothyroidism   . Moderate major depression (HCC)   . Morbid obesity (HCC)   . NAFL (nonalcoholic fatty liver)   . Pre-diabetes   . Premature ovarian failure   . Wears glasses     Patient Active Problem List   Diagnosis Date Noted  . Acute tear lateral meniscus, left,  initial encounter 04/15/2017  . 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 Bipolar 1 disorder, depressed (HCC) 04/16/2012  . Depression, major, recurrent, moderate (HCC) 04/01/2012    Past Surgical History:  Procedure Laterality Date  . CARPAL TUNNEL RELEASE Bilateral LEFT  2013//  RIGHT  JAN  2015  . CHOLECYSTECTOMY  2011  . HYSTEROSCOPY W/D&C  10/17/2010   Procedure: DILATATION AND CURETTAGE (D&C) /HYSTEROSCOPY;  Surgeon: Jessee Avers;  Location: [facility];  Service: Gynecology;  Laterality: N/A;  . HYSTEROSCOPY W/D&C N/A 06/21/2012   Procedure: DILATATION AND CURETTAGE /HYSTEROSCOPY;  Surgeon: [name], MD;  Location: [facility];  Service: Gynecology;  Laterality: N/A;  . KNEE ARTHROSCOPY WITH MEDIAL MENISECTOMY Left 04/15/2017   Procedure: Left knee arthroscopic partial medial, lateral and partial menisectomies with lateral release;  Surgeon: Yolonda Kida, MD;  Location: [facility];  Service: Orthopedics;  Laterality: Left;  . LAPAROSCOPIC TUBAL LIGATION  01/09/2011   Procedure: LAPAROSCOPIC TUBAL LIGATION;  Surgeon: [name];  Location: [facility];  Service: Gynecology;  Laterality: Bilateral;  with Filshie clips  . LEEP  01/09/2011   Procedure: LOOP ELECTROSURGICAL EXCISION PROCEDURE (LEEP);  Surgeon: [name];  Location: [facility];  Service: Gynecology;  Laterality: N/A;  . LEEP N/A 09/16/2013   Procedure: LOOP ELECTROSURGICAL EXCISION PROCEDURE (LEEP);  Surgeon: [name], MD;  Location: [facility];  Service: Gynecology;  Laterality: N/A;  . NEGATIVE SLEEP STUDY  2014   per pt  . ORIF LEFT FOOT FX  2008   HARDWARE  REMOVED 6 MONTHS LATER  . STOMACH SURGERY  OBERA GASTRIC BALLON JULY 2018  . VULVECTOMY N/A 06/21/2012   Procedure: WIDE EXCISION VULVECTOMY;  Surgeon: [name], MD;  Location: [facility];  Service: Gynecology;  Laterality: N/A;  excision of vulvar lesions,  need colposcope     OB  History    Gravida  0   Para      Term      Preterm      AB      Living        SAB      TAB      Ectopic      Multiple      Live Births               Home Medications    Prior to Admission medications   Medication Sig Start Date End Date Taking? Authorizing Provider  acetaminophen (TYLENOL) 650 MG CR tablet Take 1,300 mg by mouth every 8 (eight) hours as needed for pain.    [provider]  amantadine (SYMMETREL) 100 MG capsule Take 100 mg by mouth 2 (two) times daily.    [provider]  ARIPiprazole (ABILIFY) 10 MG tablet Take 10 mg by mouth at bedtime.    [provider]  buPROPion (WELLBUTRIN XL) 150 MG 24 hr tablet Take 450 mg by mouth every morning.     [provider]  divalproex (DEPAKOTE ER) 500 MG 24 hr tablet Take 500 mg by mouth 2 (two) times daily.     [provider]  DULoxetine (CYMBALTA) 60 MG capsule Take 60 mg by mouth 2 (two) times daily.     [provider]  hydrOXYzine (ATARAX/VISTARIL) 10 MG tablet Take 10 mg by mouth 2 (two) times daily.     [provider]  ibuprofen (IBU) 600 MG tablet Take 1 tablet (600 mg total) by mouth every 6 (six) hours as needed for mild pain or moderate pain. 04/15/17   Yolonda Kida, MD  levothyroxine (SYNTHROID, LEVOTHROID) 125 MCG tablet Take 250 mcg by mouth daily before breakfast.     [provider]  omeprazole (PRILOSEC) 20 MG capsule Take 20 mg by mouth 2 (two) times daily.    [provider]  zolpidem (AMBIEN) 10 MG tablet Take 10 mg by mouth at bedtime.     [provider]    Family History Family History  Problem Relation Age of Onset  . Cancer Mother   . Mental illness Mother   . Alcohol abuse Mother   . Drug abuse Mother   . Bipolar disorder Sister   . Alcohol abuse Maternal Aunt   . Drug abuse Maternal Aunt   . Depression Maternal Aunt   . Schizophrenia Maternal Aunt   . Alcohol abuse Maternal Uncle    . Alcohol abuse Maternal Grandfather   . Diabetes Father     Social History Social History   Tobacco Use  . Smoking status: Current Every Day Smoker    Packs/day: 0.50    Years: 25.00    Pack years: 12.50    Types: Cigarettes  . Smokeless tobacco: Never Used  Substance Use Topics  . Alcohol use: No  . Drug use: No     Allergies   Codeine; Hydrocodone; Oxycodone; and Trazodone and nefazodone  Review of Systems Review of Systems  All other systems reviewed and are negative.    Physical Exam Updated Vital Signs BP 121/85 (BP Location: Right Arm)   Pulse 96   Temp 98.4 F (36.9 C) (Oral)   Resp 18   Ht 5\' 4"  (1.626 m)   Wt (!) 147.4 kg (325 lb)   SpO2 100%   BMI 55.79 kg/m   Physical Exam  Constitutional: She appears well-developed and well-nourished. No distress.  Non-toxic appearing  HENT:  Head: Normocephalic and atraumatic.  Right Ear: External ear normal.  Left Ear: External ear normal.  Neck: Normal range of motion. Neck supple. No spinous process tenderness present. No neck rigidity. Normal range of motion present.  Cardiovascular: Normal rate, regular rhythm, normal heart sounds and intact distal pulses.  No murmur heard. Pulses:      Radial pulses are 2+ on the right side, and 2+ on the left side.       Femoral pulses are 2+ on the right side, and 2+ on the left side. Unable to palpate DP and PT pulses. Doppler pulses for DP and PT intact.   Pulmonary/Chest: Effort normal and breath sounds normal. No respiratory distress.  Abdominal: Soft. Bowel sounds are normal. She exhibits no pulsatile midline mass. There is no tenderness. There is no rigidity, no rebound and no CVA tenderness.  Musculoskeletal:  Posterior and appearance appears normal. No evidence of obvious scoliosis or kyphosis. No obvious signs of skin changes, trauma, deformity, infection. No C, T, or L spine tenderness or step-offs to palpation. No C, T paraspinal tenderness. Left  paraspinal lumbar ttp. Lung expansion normal. Bilateral lower extremity strength 5 out of 5 including extensor hallucis longus. Patellar and Achilles deep tendon reflex 2+ and equal bilaterally. Sensation of lower extremities grossly intact. Straight leg right neg. Straight leg left neg. Gait able but patient notes painful. Lower extremity compartments soft. PT and DP 2+ b/l. Cap refill <2 seconds.   Neurological: She is alert.  No foot drop. Speech clear. Follows commands. No facial droop. PERRLA. EOM grossly intact. CN III-XII grossly intact. Grossly moves all extremities 4 without ataxia. Able and appropriate strength for age to upper and lower extremities bilaterally. Normal sensation b/l. Able but painful gait.   Skin: Skin is warm, dry and intact. Capillary refill takes less than 2 seconds. No rash noted. She is not diaphoretic. No erythema.  No vesicular-like rash noted.  Nursing note and vitals reviewed.    ED Treatments / Results  Labs (all labs ordered are listed, but only abnormal results are displayed) Labs Reviewed - No data to display  EKG None  Radiology No results found.  Procedures Procedures (including critical care time)  Medications Ordered in ED Medications  fentaNYL (SUBLIMAZE) injection 50 mcg (has no administration in time range)  methocarbamol (ROBAXIN) tablet 750 mg (has no administration in time range)     Initial Impression / Assessment and Plan / ED Course  I have reviewed the triage vital signs and the nursing notes.  Pertinent labs & imaging results that were available during my care of the patient were reviewed by me and considered in my medical decision making (see chart for details).     48 y.o. year old female with back pain.  No neurologic deficits and normal neuro exam.  Patient can walk but states it is painful.  No bowel or bladder incontinence.  No urinary retention or saddle anesthesia.  No concern for cauda equina.  No history of trauma,  personal history of cancer, night sweats or weight loss that would warrant a x-ray at this time.  No spinal injections, fever or IV drug use that make me concerned for spinal hematoma or abscess.  No pulsatile mass of the abdomen.  No abdominal tenderness to palpation or guarding to make me concern for intra-abdominal pathology.  No urinary symptoms or CVA tenderness to make me concerned for cystitis versus pyelonephritis versus kidney stone.  Suspect patient's symptoms are musculoskeletal in nature. No evidence of sciatica with negative straight leg raise.  Will treat the patient with back exercises, activity modification, muscle relaxers, NSAIDs (no history of kidney disease [most recent Cr wnl] or GI bleed), and lidocaine patch.  Patient is to follow-up with PCP versus sports medicine. Discussed that she may need further imaging and physical therapy in the future if your symptoms resolved.  Strict return precautions discussed.  Patient appears safe for discharge.  Final Clinical Impressions(s) / ED Diagnoses   Final diagnoses:  Acute left-sided low back pain without sciatica    ED Discharge Orders        Ordered    methocarbamol (ROBAXIN) 500 MG tablet  At bedtime PRN     10/03/17 2149    meloxicam (MOBIC) 15 MG tablet  Daily     10/03/17 2149    lidocaine (LIDODERM) 5 %  Every 24 hours     10/03/17 2149       Princella PellegriniMaczis, Michael M, PA-C 10/03/17 2149    Tilden Fossaees, Elizabeth, MD 10/04/17 [phone]

## 2017-10-03 NOTE — ED Triage Notes (Signed)
Patient states that she woke up this am with back pain to her left side of her back

## 2017-12-11 ENCOUNTER — Emergency Department (HOSPITAL_BASED_OUTPATIENT_CLINIC_OR_DEPARTMENT_OTHER)
Admission: EM | Admit: 2017-12-11 | Discharge: 2017-12-11 | Disposition: A | Discharge status: Home / Self Care | Payer: Medicare Other | Attending: Emergency Medicine | Admitting: Emergency Medicine

## 2017-12-11 ENCOUNTER — Other Ambulatory Visit: Payer: Self-pay

## 2017-12-11 ENCOUNTER — Emergency Department (HOSPITAL_BASED_OUTPATIENT_CLINIC_OR_DEPARTMENT_OTHER): Payer: Medicare Other

## 2017-12-11 ENCOUNTER — Encounter (HOSPITAL_BASED_OUTPATIENT_CLINIC_OR_DEPARTMENT_OTHER): Payer: Self-pay

## 2017-12-11 DIAGNOSIS — R05 Cough: Secondary | ICD-10-CM | POA: Diagnosis present

## 2017-12-11 DIAGNOSIS — F17210 Nicotine dependence, cigarettes, uncomplicated: Secondary | ICD-10-CM | POA: Insufficient documentation

## 2017-12-11 DIAGNOSIS — J189 Pneumonia, unspecified organism: Secondary | ICD-10-CM | POA: Insufficient documentation

## 2017-12-11 DIAGNOSIS — J181 Lobar pneumonia, unspecified organism: Secondary | ICD-10-CM

## 2017-12-11 DIAGNOSIS — E039 Hypothyroidism, unspecified: Secondary | ICD-10-CM | POA: Diagnosis not present

## 2017-12-11 LAB — COMPREHENSIVE METABOLIC PANEL
ALBUMIN: 3.9 g/dL (ref 3.5–5.0)
ALT: 27 U/L (ref 0–44)
ANION GAP: 12 (ref 5–15)
AST: 26 U/L (ref 15–41)
Alkaline Phosphatase: 76 U/L (ref 38–126)
BILIRUBIN TOTAL: 0.6 mg/dL (ref 0.3–1.2)
BUN: 16 mg/dL (ref 6–20)
CHLORIDE: 99 mmol/L (ref 98–111)
CO2: 27 mmol/L (ref 22–32)
Calcium: 9.4 mg/dL (ref 8.9–10.3)
Creatinine, Ser: 1 mg/dL (ref 0.44–1.00)
GFR calc Af Amer: >60 mL/min (ref >60)
GLUCOSE: 143 mg/dL — AB (ref 70–99)
POTASSIUM: 4.1 mmol/L (ref 3.5–5.1)
Sodium: 138 mmol/L (ref 135–145)
TOTAL PROTEIN: 7.5 g/dL (ref 6.5–8.1)

## 2017-12-11 LAB — CBC WITH DIFFERENTIAL/PLATELET
BASOS ABS: 0 10*3/uL (ref 0.0–0.1)
Basophils Relative: 0 %
EOS PCT: 6 %
Eosinophils Absolute: 0.6 10*3/uL (ref 0.0–0.7)
HEMATOCRIT: 46.4 % — AB (ref 36.0–46.0)
Hemoglobin: 15.9 g/dL — ABNORMAL HIGH (ref 12.0–15.0)
LYMPHS ABS: 2.2 10*3/uL (ref 0.7–4.0)
LYMPHS PCT: 20 %
MCH: 29.9 pg (ref 26.0–34.0)
MCHC: 34.3 g/dL (ref 30.0–36.0)
MCV: 87.2 fL (ref 78.0–100.0)
Monocytes Absolute: 0.6 10*3/uL (ref 0.1–1.0)
Monocytes Relative: 6 %
NEUTROS ABS: 7.8 10*3/uL — AB (ref 1.7–7.7)
Neutrophils Relative %: 68 %
PLATELETS: 319 10*3/uL (ref 150–400)
RBC: 5.32 MIL/uL — AB (ref 3.87–5.11)
RDW: 12.7 % (ref 11.5–15.5)
WBC: 11.2 10*3/uL — AB (ref 4.0–10.5)

## 2017-12-11 LAB — TROPONIN I: Troponin I: <0.03 ng/mL (ref <0.03)

## 2017-12-11 MED ORDER — ALBUTEROL SULFATE HFA 108 (90 BASE) MCG/ACT IN AERS
2.0000 | INHALATION_SPRAY | Freq: Once | RESPIRATORY_TRACT | Status: AC
  Administered 2017-12-11: 2 via RESPIRATORY_TRACT
  Filled 2017-12-11: qty 6.7

## 2017-12-11 MED ORDER — ALBUTEROL SULFATE HFA 108 (90 BASE) MCG/ACT IN AERS: 1.0000 | INHALATION_SPRAY | Freq: Four times a day (QID) | RESPIRATORY_TRACT | 0 refills | Status: DC | PRN

## 2017-12-11 MED ORDER — IPRATROPIUM-ALBUTEROL 0.5-2.5 (3) MG/3ML IN SOLN
3.0000 mL | Freq: Four times a day (QID) | RESPIRATORY_TRACT | Status: DC
  Administered 2017-12-11: 3 mL via RESPIRATORY_TRACT
  Filled 2017-12-11: qty 3

## 2017-12-11 NOTE — ED Notes (Signed)
Pt refused to put on mask in the lobby and triage.

## 2017-12-11 NOTE — Discharge Instructions (Signed)
You were seen in the ED today with difficulty breathing. You may have a small pneumonia on the right. Continue you antibiotics and use the albuterol as needed for shortness of breath. Call your PCP on Monday. Return to the ED with any new or worsening symptoms.

## 2017-12-11 NOTE — ED Provider Notes (Signed)
Emergency Department Provider Note   I have reviewed the triage vital signs and the nursing notes.   HISTORY  Chief Complaint Cough   HPI Lindsey Nicholson is a 47 y.o. female with PMH of Bipolar disorder, Tobacco use, GERD, and pre-diabetes presents to the emergency department for evaluation of cough and shortness of breath.  Symptoms have been worsening over the past week.  The patient denies productive cough or fever.  She states that she has had chills.  She states "I feel like I'm drowning."  She went to her primary care physician's office and was prescribed doxycycline.  She is been taking that for the past 2 days.  She states that prior to that she was on amoxicillin for a dental abscess which has resolved.  She denies any sick contacts or recent travel.  No radiation of symptoms or other modifying factors. No LE edema.    Past Medical History:  Diagnosis Date  . Anxiety   . Bipolar 1 disorder (HCC)   . Borderline personality disorder (HCC)   . Borderline personality disorder (HCC)   . Cervical dysplasia   . GERD (gastroesophageal reflux disease)   . History of vulvar dysplasia    vin I  --- s/p  wide excision 06-21-2012   . Hypothyroidism   . Moderate major depression (HCC)   . Morbid obesity (HCC)   . NAFL (nonalcoholic fatty liver)   . Pre-diabetes   . Premature ovarian failure   . Wears glasses     Patient Active Problem List   Diagnosis Date Noted  . Acute tear lateral meniscus, left, initial encounter 04/15/2017  . 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 Bipolar 1 disorder, depressed (HCC) 04/16/2012  . Depression, major, recurrent, moderate (HCC) 04/01/2012    Past Surgical History:  Procedure Laterality Date  . CARPAL TUNNEL RELEASE Bilateral LEFT  2013//  RIGHT  JAN  2015  . CHOLECYSTECTOMY  2011  . HYSTEROSCOPY W/D&C  10/17/2010   Procedure: DILATATION AND CURETTAGE (D&C) /HYSTEROSCOPY;  Surgeon: Jessee Avers;  Location: [facility];  Service: Gynecology;  Laterality: N/A;  . HYSTEROSCOPY W/D&C N/A 06/21/2012   Procedure: DILATATION AND CURETTAGE /HYSTEROSCOPY;  Surgeon: [name], MD;  Location: [facility];  Service: Gynecology;  Laterality: N/A;  . KNEE ARTHROSCOPY WITH MEDIAL MENISECTOMY Left 04/15/2017   Procedure: Left knee arthroscopic partial medial, lateral and partial menisectomies with lateral release;  Surgeon: Yolonda Kida, MD;  Location: [facility];  Service: Orthopedics;  Laterality: Left;  . LAPAROSCOPIC TUBAL LIGATION  01/09/2011   Procedure: LAPAROSCOPIC TUBAL LIGATION;  Surgeon: [name];  Location: [facility];  Service: Gynecology;  Laterality: Bilateral;  with Filshie clips  . LEEP  01/09/2011   Procedure: LOOP ELECTROSURGICAL EXCISION PROCEDURE (LEEP);  Surgeon: [name];  Location: [facility];  Service: Gynecology;  Laterality: N/A;  . LEEP N/A 09/16/2013   Procedure: LOOP ELECTROSURGICAL EXCISION PROCEDURE (LEEP);  Surgeon: [name], MD;  Location: [facility];  Service: Gynecology;  Laterality: N/A;  . NEGATIVE SLEEP STUDY  2014   per pt  . ORIF LEFT FOOT FX  2008   HARDWARE  REMOVED 6 MONTHS LATER  . STOMACH SURGERY  OBERA GASTRIC BALLON JULY 2018  . VULVECTOMY N/A 06/21/2012   Procedure: WIDE EXCISION VULVECTOMY;  Surgeon: [name], MD;  Location: [facility];  Service: Gynecology;  Laterality: N/A;  excision of vulvar lesions,  need colposcope    Allergies Codeine; Hydrocodone; Oxycodone; Trazodone and nefazodone; and Vicodin [hydrocodone-acetaminophen]  Family History  Problem Relation Age of Onset  . Cancer Mother   . Mental illness Mother   . Alcohol abuse Mother   . Drug abuse Mother   . Bipolar disorder Sister   . Alcohol abuse Maternal Aunt   . Drug abuse Maternal Aunt   . Depression Maternal Aunt   . Schizophrenia Maternal Aunt   . Alcohol abuse Maternal Uncle   . Alcohol abuse Maternal Grandfather   .  Diabetes Father     Social History Social History   Tobacco Use  . Smoking status: Current Every Day Smoker    Packs/day: 0.50    Years: 25.00    Pack years: 12.50    Types: Cigarettes  . Smokeless tobacco: Never Used  Substance Use Topics  . Alcohol use: No  . Drug use: No    Review of Systems  Constitutional: No fever/chills Eyes: No visual changes. ENT: No sore throat. Cardiovascular: Denies chest pain. Respiratory: Positive shortness of breath and cough.  Gastrointestinal: No abdominal pain.  No nausea, no vomiting.  No diarrhea.  No constipation. Genitourinary: Negative for dysuria. Musculoskeletal: Negative for back pain. Skin: Negative for rash. Neurological: Negative for headaches, focal weakness or numbness.  10-point ROS otherwise negative.  ____________________________________________   PHYSICAL EXAM:  VITAL SIGNS: ED Triage Vitals  Enc Vitals Group     BP 12/11/17 1904 (!) 128/96     Pulse Rate 12/11/17 1904 (!) 126     Resp 12/11/17 1904 (!) 28     Temp 12/11/17 1904 98.4 F (36.9 C)     Temp Source 12/11/17 1904 Oral     SpO2 12/11/17 1904 97 %     Weight 12/11/17 1905 (!) 326 lb (147.9 kg)     Height 12/11/17 1905 5\' 4"  (1.626 m)   Constitutional: Alert and oriented. Well appearing and in no acute distress. Eyes: Conjunctivae are normal. Head: Atraumatic. Nose: No congestion/rhinnorhea. Mouth/Throat: Mucous membranes are moist.  Neck: No stridor. Cardiovascular: Tachycardia. Good peripheral circulation. Grossly normal heart sounds.   Respiratory: Increased respiratory effort.  No retractions. Lungs with bilateral coarse wheezing and rhonchi. Symmetrical exam.  Gastrointestinal: Soft and nontender. No distention.  Musculoskeletal: No lower extremity tenderness nor edema. No gross deformities of extremities. Neurologic:  Normal speech and language. No gross focal neurologic deficits are appreciated.  Skin:  Skin is warm, dry and intact. No  rash noted.  ____________________________________________   LABS (all labs ordered are listed, but only abnormal results are displayed)  Labs Reviewed  COMPREHENSIVE METABOLIC PANEL - Abnormal; Notable for the following components:      Result Value   Glucose, Bld 143 (*)    All other components within normal limits  CBC WITH DIFFERENTIAL/PLATELET - Abnormal; Notable for the following components:   WBC 11.2 (*)    RBC 5.32 (*)    Hemoglobin 15.9 (*)    HCT 46.4 (*)    Neutro Abs 7.8 (*)    All other components within normal limits  TROPONIN I   ____________________________________________  EKG   EKG Interpretation  Date/Time:  Friday December 11 2017 19:47:12 EDT Ventricular Rate:  109 PR Interval:    QRS Duration: 92 QT Interval:  331 QTC Calculation: 446 R Axis:   11 Text Interpretation:  Sinus tachycardia Low voltage, precordial leads No STEMI.  Confirmed by Alona Bene [phone]) on 12/11/2017 7:52:13 PM  ____________________________________________  RADIOLOGY  Dg Chest 2 View  Result Date: 12/11/2017 CLINICAL DATA:  Cough, congestion, headache for 1 week.  Abscess 2 EXAM: CHEST - 2 VIEW COMPARISON:  02/16/2016 FINDINGS: Normal heart size. Lungs under aerated. Patchy opacities at the medial right lung base. No pneumothorax. No pleural effusion. IMPRESSION: Right basilar atelectasis versus airspace disease. Electronically Signed   By: Jolaine ClickArthur  Hoss M.D.   On: 12/11/2017 20:23    ____________________________________________   PROCEDURES  Procedure(s) performed:   Procedures  None ____________________________________________   INITIAL IMPRESSION / ASSESSMENT AND PLAN / ED COURSE  Pertinent labs & imaging results that were available during my care of the patient were reviewed by me and considered in my medical decision making (see chart for details).  She presents to the emergency department with cough and shortness of breath.  She has been on  doxycycline for the past 2 days with worsening symptoms.  She does smoke.  She has coarse wheezing and rhonchi bilaterally.  She does not appear volume overloaded.  Plan for chest x-ray, labs, DuoNeb, reassess.   Patient is feeling dramatically better after DuoNeb.  Chest x-ray shows atelectasis at the right base.  No fever or hypoxemia.  I will have the patient continue her doxycycline as she is only been on this for 2 days.  She is adamantly refusing any steroids due to upcoming bariatric procedure though she does not have a date for this procedure scheduled as of yet.  Gave inhaler in the emergency department and will have the patient continue this as needed at home. She has tessalon at home already. HR and tachypnea improving significantly. Discussed ED return precautions and plan to call PCP on Monday for f/u appointment early next week.   ____________________________________________  FINAL CLINICAL IMPRESSION(S) / ED DIAGNOSES  Final diagnoses:  Community acquired pneumonia of right lower lobe of lung (HCC)    MEDICATIONS GIVEN DURING THIS VISIT:  Medications  ipratropium-albuterol (DUONEB) 0.5-2.5 (3) MG/3ML nebulizer solution 3 mL (3 mLs Nebulization Given 12/11/17 1935)  albuterol (PROVENTIL HFA;VENTOLIN HFA) 108 (90 Base) MCG/ACT inhaler 2 puff (2 puffs Inhalation Given 12/11/17 2112)     NEW OUTPATIENT MEDICATIONS STARTED DURING THIS VISIT:  Discharge Medication List as of 12/11/2017  9:02 PM    START taking these medications   Details  albuterol (PROVENTIL HFA;VENTOLIN HFA) 108 (90 Base) MCG/ACT inhaler Inhale 1-2 puffs into the lungs every 6 (six) hours as needed for wheezing or shortness of breath., Starting Fri 12/11/2017, Print        Note:  This document was prepared using Dragon voice recognition software and may include unintentional dictation errors.  Alona BeneJoshua Long, MD Emergency Medicine    Long, Arlyss RepressJoshua G, MD 12/11/17 2138

## 2017-12-11 NOTE — ED Triage Notes (Signed)
Pt reports cough for 1 week. Pt went and seen her PCP and was given doxycycline and pt is still feeling worse.

## 2018-03-31 DIAGNOSIS — K3184 Gastroparesis: Secondary | ICD-10-CM

## 2018-03-31 HISTORY — DX: Gastroparesis: K31.84

## 2018-04-06 ENCOUNTER — Other Ambulatory Visit: Payer: Self-pay

## 2018-04-06 ENCOUNTER — Emergency Department (HOSPITAL_BASED_OUTPATIENT_CLINIC_OR_DEPARTMENT_OTHER)
Admission: EM | Admit: 2018-04-06 | Discharge: 2018-04-06 | Disposition: A | Discharge status: Home / Self Care | Payer: Medicare Other | Attending: Emergency Medicine | Admitting: Emergency Medicine

## 2018-04-06 ENCOUNTER — Encounter (HOSPITAL_BASED_OUTPATIENT_CLINIC_OR_DEPARTMENT_OTHER): Payer: Self-pay

## 2018-04-06 DIAGNOSIS — E039 Hypothyroidism, unspecified: Secondary | ICD-10-CM | POA: Diagnosis not present

## 2018-04-06 DIAGNOSIS — F17210 Nicotine dependence, cigarettes, uncomplicated: Secondary | ICD-10-CM | POA: Diagnosis not present

## 2018-04-06 DIAGNOSIS — L0291 Cutaneous abscess, unspecified: Secondary | ICD-10-CM

## 2018-04-06 DIAGNOSIS — Z79899 Other long term (current) drug therapy: Secondary | ICD-10-CM | POA: Diagnosis not present

## 2018-04-06 DIAGNOSIS — N764 Abscess of vulva: Secondary | ICD-10-CM | POA: Insufficient documentation

## 2018-04-06 MED ORDER — TRAMADOL HCL 50 MG PO TABS
50.0000 mg | ORAL_TABLET | Freq: Once | ORAL | Status: AC
  Administered 2018-04-06: 50 mg via ORAL
  Filled 2018-04-06: qty 1

## 2018-04-06 MED ORDER — LIDOCAINE-EPINEPHRINE (PF) 2 %-1:200000 IJ SOLN
INTRAMUSCULAR | Status: AC
  Filled 2018-04-06: qty 10

## 2018-04-06 MED ORDER — LIDOCAINE-EPINEPHRINE (PF) 2 %-1:200000 IJ SOLN
20.0000 mL | Freq: Once | INTRAMUSCULAR | Status: AC
  Administered 2018-04-06: 10 mL via INTRADERMAL
  Filled 2018-04-06: qty 20

## 2018-04-06 NOTE — ED Provider Notes (Signed)
[facility] EMERGENCY DEPARTMENT Provider Note   CSN: [id number] Arrival date & time: 04/06/18  1835     History   Chief Complaint Chief Complaint  Patient presents with  . Abscess    HPI Lindsey Nicholson is a 48 y.o. female presents for evaluation of abscess to vaginal area x3 days.  Patient reports that yesterday it started draining but then today, got more painful, more swollen.  Patient reports that any type of pressure to the area causes pain and reports difficulty sitting secondary to pain.  Patient states she has had 1 here previously before this required drainage from her OB/GYN.  Patient reports she has had some nausea but no vomiting.  Patient reports he has been able to have bowel movements without any significant pain.  She has not noticed any dysuria, hematuria.  Patient states that she has not noted any fevers.  The history is provided by the patient.    Past Medical History:  Diagnosis Date  . Anxiety   . Bipolar 1 disorder (HCC)   . Borderline personality disorder (HCC)   . Borderline personality disorder (HCC)   . Cervical dysplasia   . GERD (gastroesophageal reflux disease)   . History of vulvar dysplasia    vin I  --- s/p  wide excision 06-21-2012   . Hypothyroidism   . Moderate major depression (HCC)   . Morbid obesity (HCC)   . NAFL (nonalcoholic fatty liver)   . Pre-diabetes   . Premature ovarian failure   . Wears glasses     Patient Active Problem List   Diagnosis Date Noted  . Acute tear lateral meniscus, left, initial encounter 04/15/2017  . 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 Bipolar 1 disorder, depressed (HCC) 04/16/2012  . Depression, major, recurrent, moderate (HCC) 04/01/2012    Past Surgical History:  Procedure Laterality Date  . CARPAL TUNNEL RELEASE Bilateral LEFT  2013//  RIGHT  JAN  2015  . CHOLECYSTECTOMY  2011  . HYSTEROSCOPY W/D&C  10/17/2010   Procedure: DILATATION AND  CURETTAGE (D&C) /HYSTEROSCOPY;  Surgeon: Jessee Aversara J. Cole;  Location: [facility];  Service: Gynecology;  Laterality: N/A;  . HYSTEROSCOPY W/D&C N/A 06/21/2012   Procedure: DILATATION AND CURETTAGE /HYSTEROSCOPY;  Surgeon: Meriel Picaichard M Holland, MD;  Location: [facility];  Service: Gynecology;  Laterality: N/A;  . KNEE ARTHROSCOPY WITH MEDIAL MENISECTOMY Left 04/15/2017   Procedure: Left knee arthroscopic partial medial, lateral and partial menisectomies with lateral release;  Surgeon: Yolonda Kidaogers, Jason Patrick, MD;  Location: [facility];  Service: Orthopedics;  Laterality: Left;  . LAPAROSCOPIC TUBAL LIGATION  01/09/2011   Procedure: LAPAROSCOPIC TUBAL LIGATION;  Surgeon: Meriel Picaichard M Holland;  Location: [facility];  Service: Gynecology;  Laterality: Bilateral;  with [place] clips  . LEEP  01/09/2011   Procedure: LOOP ELECTROSURGICAL EXCISION PROCEDURE (LEEP);  Surgeon: Meriel Picaichard M Holland;  Location: [facility];  Service: Gynecology;  Laterality: N/A;  . LEEP N/A 09/16/2013   Procedure: LOOP ELECTROSURGICAL EXCISION PROCEDURE (LEEP);  Surgeon: Meriel Picaichard M Holland, MD;  [facility];  Service: Gynecology;  Laterality: N/A;  . NEGATIVE SLEEP STUDY  2014   per pt  . ORIF LEFT FOOT FX  2008   HARDWARE  REMOVED 6 MONTHS LATER  . STOMACH SURGERY  [facility]ERA [facility]STR[facility] JULY 2018  . VULVECTOMY N/A 06/21/2012   Procedure: WIDE EXCISION VULVECTOMY;  Surgeon: Meriel Picaichard M Holland, MD;  Location: [facility];  Service: Gynecology;  Laterality:  N/A;  excision of vulvar lesions,  need colposcope     OB History    Gravida  0   Para      Term      Preterm      AB      Living        SAB      TAB      Ectopic      Multiple      Live Births               Home Medications    Prior to Admission medications   Medication Sig Start Date End Date Taking? Authorizing Provider  acetaminophen (TYLENOL) 650 MG CR tablet Take 1,300 mg by mouth every 8 (eight) hours as needed for pain.    [provider]    albuterol (PROVENTIL HFA;VENTOLIN HFA) 108 (90 Base) MCG/ACT inhaler Inhale 1-2 puffs into the lungs every 6 (six) hours as needed for wheezing or shortness of breath. 12/11/17   Long, Arlyss Repress, MD  amantadine (SYMMETREL) 100 MG capsule Take 100 mg by mouth 2 (two) times daily.    [provider]  ARIPiprazole (ABILIFY) 10 MG tablet Take 10 mg by mouth at bedtime.    [provider]  buPROPion (WELLBUTRIN XL) 150 MG 24 hr tablet Take 450 mg by mouth every morning.     [provider]  divalproex (DEPAKOTE ER) 500 MG 24 hr tablet Take 500 mg by mouth 2 (two) times daily.     [provider]  DULoxetine (CYMBALTA) 60 MG capsule Take 60 mg by mouth 2 (two) times daily.     [provider]  hydrOXYzine (ATARAX/VISTARIL) 10 MG tablet Take 10 mg by mouth 2 (two) times daily.     [provider]  ibuprofen (IBU) 600 MG tablet Take 1 tablet (600 mg total) by mouth every 6 (six) hours as needed for mild pain or moderate pain. 04/15/17   Yolonda Kida, MD  levothyroxine (SYNTHROID, LEVOTHROID) 125 MCG tablet Take 250 mcg by mouth daily before breakfast.     [provider]  lidocaine (LIDODERM) 5 % Place 1 patch onto the skin daily. Remove & Discard patch within 12 hours or as directed by MD 10/03/17   Maczis, Elmer Sow, PA-C  meloxicam (MOBIC) 15 MG tablet Take 1 tablet (15 mg total) by mouth daily. 10/03/17   Maczis, Elmer Sow, PA-C  methocarbamol (ROBAXIN) 500 MG tablet Take 1 tablet (500 mg total) by mouth at bedtime as needed for muscle spasms. 10/03/17   Maczis, Elmer Sow, PA-C  omeprazole (PRILOSEC) 20 MG capsule Take 20 mg by mouth 2 (two) times daily.    [provider]  zolpidem (AMBIEN) 10 MG tablet Take 10 mg by mouth at bedtime.     [provider]    Family History Family History  Problem Relation Age of Onset  . Cancer Mother   . Mental illness Mother   . Alcohol abuse Mother   . Drug abuse Mother   .  Bipolar disorder Sister   . Alcohol abuse Maternal Aunt   . Drug abuse Maternal Aunt   . Depression Maternal Aunt   . Schizophrenia Maternal Aunt   . Alcohol abuse Maternal Uncle   . Alcohol abuse Maternal Grandfather   . Diabetes Father     Social History Social History   Tobacco Use  . Smoking status: Current Every Day Smoker    Packs/day: 0.50  Years: 25.00    Pack years: 12.50    Types: Cigarettes  . Smokeless tobacco: Never Used  Substance Use Topics  . Alcohol use: No  . Drug use: No     Allergies   Codeine; Hydrocodone; Oxycodone; Trazodone and nefazodone; and Vicodin [hydrocodone-acetaminophen]   Review of Systems Review of Systems  Constitutional: Negative for fever.  Gastrointestinal: Positive for nausea. Negative for vomiting.  Skin: Positive for wound.  All other systems reviewed and are negative.    Physical Exam Updated Vital Signs BP (!) 127/91 (BP Location: Right Arm)   Pulse 100   Temp 98.5 F (36.9 C) (Oral)   Resp 18   Ht 5\' 4"  (1.626 m)   Wt (!) 155.1 kg   SpO2 98%   BMI 58.70 kg/m   Physical Exam Vitals signs and nursing note reviewed. Exam conducted with a chaperone present.  Constitutional:      Appearance: She is well-developed.  HENT:     Head: Normocephalic and atraumatic.  Eyes:     General: No scleral icterus.       Right eye: No discharge.        Left eye: No discharge.     Conjunctiva/sclera: Conjunctivae normal.  Pulmonary:     Effort: Pulmonary effort is normal.  Genitourinary:    Rectum: Normal. No mass or tenderness.       Comments: The exam was performed with a chaperone present.  Diffuse area of warmth, induration, swelling with central fluctuance noted to the left labial fold.  Exam not consistent with Bartholin's abscess.  Rectal exam shows no evidence of rectal mass, rectal tenderness. Skin:    General: Skin is warm and dry.  Neurological:     Mental Status: She is alert.  Psychiatric:        Speech:  Speech normal.        Behavior: Behavior normal.      ED Treatments / Results  Labs (all labs ordered are listed, but only abnormal results are displayed) Labs Reviewed - No data to display  EKG None  Radiology No results found.  Procedures .Marland KitchenIncision and Drainage Date/Time: 04/06/2018 8:42 PM Performed by: Maxwell Caul, PA-C Authorized by: Maxwell Caul, PA-C   Consent:    Consent obtained:  Verbal   Consent given by:  Patient   Risks discussed:  Bleeding, incomplete drainage, pain and damage to other organs   Alternatives discussed:  No treatment Universal protocol:    Procedure explained and questions answered to patient or proxy's satisfaction: yes     Relevant documents present and verified: yes     Test results available and properly labeled: yes     Imaging studies available: yes     Required blood products, implants, devices, and special equipment available: yes     Site/side marked: yes     Immediately prior to procedure a time out was called: yes     Patient identity confirmed:  Verbally with patient Location:    Type:  Abscess Pre-procedure details:    Skin preparation:  Betadine Anesthesia (see MAR for exact dosages):    Anesthesia method:  Local infiltration   Local anesthetic:  Lidocaine 1% WITH epi Procedure type:    Complexity:  Complex Procedure details:    Incision types:  Single straight   Incision depth:  Subcutaneous   Scalpel blade:  11   Wound management:  Probed and deloculated, irrigated with saline and extensive cleaning   Drainage:  Purulent   Drainage amount:  Moderate   Packing materials:  None Post-procedure details:    Patient tolerance of procedure:  Tolerated well, no immediate complications Comments:     Once the wound was anesthetized, was thoroughly and extensively irrigated and cleaned with Betadine.  On incision, there was moderate purulent drainage noted.   (including critical care time)  Medications Ordered in  ED Medications  lidocaine-EPINEPHrine (XYLOCAINE W/EPI) 2 %-1:200000 (PF) injection 20 mL (10 mLs Intradermal Given 04/06/18 1942)  traMADol (ULTRAM) tablet 50 mg (50 mg Oral Given 04/06/18 1943)     Initial Impression / Assessment and Plan / ED Course  I have reviewed the triage vital signs and the nursing notes.  Pertinent labs & imaging results that were available during my care of the patient were reviewed by me and considered in my medical decision making (see chart for details).     48 year old female who presents for evaluation of redness, pain, swelling noted to the left groin x3 days.  She reports yesterday it started draining but then stopped.  She reports that since then, has had had increased pain, swelling to the area.  She has had an abscess there previously that was had to be drained by her OB/GYN.  Patient states she has not had any fevers.  She does report that is worse to sit but has not had any rectal pain or pain with bowel movements.  Initially to arrival, patient is is afebrile.  She appears uncomfortable but no acute distress.  Vital signs reviewed and stable.  She is slightly tachycardic.  Likely secondary to pain.  On exam, she does have an area of diffuse erythema, induration and warmth with central fluctuance noted to the left groin that just extends right up to the labia majora.  No evidence of Bartholin's abscess on exam.  Rectal exam revealed no obvious tenderness, palpable mass.  History/physical exam not concerning for perianal, perirectal abscess.  Discussed treatment options with patient.  Patient is agreeable to drainage here in the ED.  I&D as documented above.  Patient tolerated procedure well.  Patient reports she is currently taking penicillin for dental infection.  She reports she was just recently given a prescription for doxycycline which he supposed to start in the next few days.  Encouraged her to continue taking this antibiotic.  Additionally, instructed  patient for wound recheck in 2 days.  Courage at home supportive care measures.  Repeat vitals reviewed and stable. At this time, patient exhibits no emergent life-threatening condition that require further evaluation in ED or admission. Patient had ample opportunity for questions and discussion. All patient's questions were answered with full understanding. Strict return precautions discussed. Patient expresses understanding and agreement to plan.    Portions of this note were generated with Scientist, clinical (histocompatibility and immunogenetics). Dictation errors may occur despite best attempts at proofreading.    Final Clinical Impressions(s) / ED Diagnoses   Final diagnoses:  Abscess    ED Discharge Orders    None       Maxwell Caul, PA-C 04/06/18 2311    Melene Plan, DO 04/06/18 2337

## 2018-04-06 NOTE — ED Triage Notes (Signed)
C/o abscess to perineum x 3 days-NAD-steady gait

## 2018-04-06 NOTE — ED Notes (Signed)
Perineum abscess x 3 days  States was draining yesterday  But not today  Increased pain

## 2018-04-06 NOTE — Discharge Instructions (Signed)
Apply warm compresses to the area or soak the area in warm water to help continue express drainage.   Keep the wound clean and dry. Gently wash the wound with soap and water and make sure to pat it dry.    Take antibiotic and complete the entire course.   You can take Tylenol or Ibuprofen as directed for pain.  Followup with urgency department or your OB/GYN in 2 days for wound recheck.   Return to the Emergency Department if you experienced any worsening/spreading redness or swelling, fever, worsening pain, or any other worsening or concerning symptoms.

## 2018-04-14 NOTE — H&P (Signed)
NAME: Lindsey Nicholson, Lindsey Nicholson. MEDICAL RECORD ER:[id number] ACCOUNT [id number] DATE OF BIRTH:09-30-1970 FACILITY: [facility] LOCATION:  PHYSICIAN:Cassie Shedlock Milana Obey, MD  HISTORY AND PHYSICAL  DATE OF ADMISSION:  05/04/2018  HISTORY OF PRESENT ILLNESS:  The patient is a 48 year old G0 P0.  She had a prior tubal ligation and loop excision for cervical dysplasia that was in 2015.  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  Her most recent Pap on 06/19 was normal, HPV negative, and a cervical cytology was normal.  Recently, she has had continued problems related to several things including some irregular bleeding.  Evaluation for this problem showed that her FSH was 8.7.  SHG dated 03/25/2018 showed an 11 mm polypoid mass in the endometrium.  She presents at this  time for outpatient D and C hysteroscopy, resection of polyp, and NovaSure endometrial ablation.  This procedure including the specific risks regarding bleeding, infection, adjacent organ injury, possible need for other surgery was reviewed with her,  which she understands and accepts.  Also, recently she had an MAU visit for drainage of a vulvar abscess.  PAST MEDICAL HISTORY:  She has had cholecystectomy, tubal carpal tunnel release, LEEP, D and C, vulvar biopsy.  MEDICATIONS:  Wellbutrin, levothyroxine, hydroxyzine, Ambien as needed, amantadine, Cymbalta, levothyroxine.  REVIEW OF SYSTEMS:  Positive for history of stress migraines, carpal tunnel problems, hypothyroidism, bipolar disease, anxiety with panic attacks and recurrent moderate depression.  SOCIAL HISTORY:  She is divorced.  Denies alcohol or drug use.  She does smoke 1/2 PPD.  PHYSICAL EXAMINATION: VITAL SIGNS:  Temperature 98.2, blood pressure 130/78. HEENT:  Unremarkable. NECK:  Supple, without masses. LUNGS:  Clear. CARDIOVASCULAR:  Regular rate and rhythm without murmurs, rubs or  gallops. BREASTS:  Without masses. ABDOMEN:  Soft, flat, nontender. PELVIC:  Vulva exam unremarkable.  Vagina and cervix normal.  Uterus midposition, normal size.  Adnexa negative.  Exam difficult due to her weight of 338. EXTREMITIES:  Unremarkable. NEUROLOGIC:  Unremarkable.  IMPRESSION:  Abnormal bleeding with endometrial polyp.  PLAN:  D and C, hysteroscopy with MyoSure and NovaSure endometrial ablation.  Procedure and risks reviewed as above.  LN/NUANCE  D:04/12/2018 T:04/12/2018 JOB:[id number]

## 2018-04-21 ENCOUNTER — Other Ambulatory Visit: Payer: Self-pay

## 2018-04-21 ENCOUNTER — Emergency Department (HOSPITAL_BASED_OUTPATIENT_CLINIC_OR_DEPARTMENT_OTHER)
Admission: EM | Admit: 2018-04-21 | Discharge: 2018-04-21 | Disposition: A | Discharge status: Home / Self Care | Payer: Medicare Other | Attending: Emergency Medicine | Admitting: Emergency Medicine

## 2018-04-21 ENCOUNTER — Encounter (HOSPITAL_BASED_OUTPATIENT_CLINIC_OR_DEPARTMENT_OTHER): Payer: Self-pay | Admitting: Emergency Medicine

## 2018-04-21 DIAGNOSIS — R358 Other polyuria: Secondary | ICD-10-CM | POA: Diagnosis not present

## 2018-04-21 DIAGNOSIS — Z79899 Other long term (current) drug therapy: Secondary | ICD-10-CM | POA: Insufficient documentation

## 2018-04-21 DIAGNOSIS — E1065 Type 1 diabetes mellitus with hyperglycemia: Secondary | ICD-10-CM | POA: Diagnosis not present

## 2018-04-21 DIAGNOSIS — E039 Hypothyroidism, unspecified: Secondary | ICD-10-CM | POA: Insufficient documentation

## 2018-04-21 DIAGNOSIS — R631 Polydipsia: Secondary | ICD-10-CM | POA: Insufficient documentation

## 2018-04-21 DIAGNOSIS — R739 Hyperglycemia, unspecified: Secondary | ICD-10-CM

## 2018-04-21 DIAGNOSIS — F17210 Nicotine dependence, cigarettes, uncomplicated: Secondary | ICD-10-CM | POA: Insufficient documentation

## 2018-04-21 DIAGNOSIS — Z7984 Long term (current) use of oral hypoglycemic drugs: Secondary | ICD-10-CM | POA: Diagnosis not present

## 2018-04-21 LAB — CBC
HCT: 45.4 % (ref 36.0–46.0)
Hemoglobin: 14.7 g/dL (ref 12.0–15.0)
MCH: 29.6 pg (ref 26.0–34.0)
MCHC: 32.4 g/dL (ref 30.0–36.0)
MCV: 91.3 fL (ref 80.0–100.0)
Platelets: 273 10*3/uL (ref 150–400)
RBC: 4.97 MIL/uL (ref 3.87–5.11)
RDW: 12.6 % (ref 11.5–15.5)
WBC: 6.3 10*3/uL (ref 4.0–10.5)
nRBC: 0 % (ref 0.0–0.2)

## 2018-04-21 LAB — URINALYSIS, ROUTINE W REFLEX MICROSCOPIC
Bilirubin Urine: NEGATIVE
Glucose, UA: >=500 mg/dL — AB
Hgb urine dipstick: NEGATIVE
Ketones, ur: NEGATIVE mg/dL
Leukocytes, UA: NEGATIVE
Nitrite: NEGATIVE
PH: 6 (ref 5.0–8.0)
Protein, ur: NEGATIVE mg/dL
Specific Gravity, Urine: <1.005 — ABNORMAL LOW (ref 1.005–1.030)

## 2018-04-21 LAB — COMPREHENSIVE METABOLIC PANEL
ALT: 62 U/L — ABNORMAL HIGH (ref 0–44)
AST: 50 U/L — ABNORMAL HIGH (ref 15–41)
Albumin: 3.8 g/dL (ref 3.5–5.0)
Alkaline Phosphatase: 75 U/L (ref 38–126)
Anion gap: 9 (ref 5–15)
BILIRUBIN TOTAL: 0.4 mg/dL (ref 0.3–1.2)
BUN: 15 mg/dL (ref 6–20)
CO2: 25 mmol/L (ref 22–32)
CREATININE: 0.88 mg/dL (ref 0.44–1.00)
Calcium: 9 mg/dL (ref 8.9–10.3)
Chloride: 98 mmol/L (ref 98–111)
GFR calc Af Amer: >60 mL/min (ref >60)
GFR calc non Af Amer: >60 mL/min (ref >60)
Glucose, Bld: 357 mg/dL — ABNORMAL HIGH (ref 70–99)
Potassium: 3.9 mmol/L (ref 3.5–5.1)
Sodium: 132 mmol/L — ABNORMAL LOW (ref 135–145)
Total Protein: 7 g/dL (ref 6.5–8.1)

## 2018-04-21 LAB — URINALYSIS, MICROSCOPIC (REFLEX)

## 2018-04-21 LAB — CBG MONITORING, ED
Glucose-Capillary: 419 mg/dL — ABNORMAL HIGH (ref 70–99)
Glucose-Capillary: 260 mg/dL — ABNORMAL HIGH (ref 70–99)

## 2018-04-21 LAB — PREGNANCY, URINE: Preg Test, Ur: NEGATIVE

## 2018-04-21 MED ORDER — SODIUM CHLORIDE 0.9 % IV BOLUS
1000.0000 mL | Freq: Once | INTRAVENOUS | Status: AC
  Administered 2018-04-21: 1000 mL via INTRAVENOUS

## 2018-04-21 MED ORDER — METFORMIN HCL 500 MG PO TABS: 500.0000 mg | ORAL_TABLET | Freq: Two times a day (BID) | ORAL | 0 refills | Status: DC

## 2018-04-21 NOTE — ED Provider Notes (Signed)
[facility] EMERGENCY DEPARTMENT Provider Note   CSN: [id number] Arrival date & time: 04/21/18  1051     History   Chief Complaint Chief Complaint  Patient presents with  . Hyperglycemia    HPI Lindsey Nicholson is a 48 y.o. female.  48 year old female with past medical history below including bipolar disorder, GERD, prediabetes, nonalcoholic fatty liver who presents with hyperglycemia.  The patient began feeling shaky and generally not well this morning.  She checked her blood sugar at home and it was in the 400s.  Reports having some dry heaves but nothing came up.  No associated diarrhea or constipation.  No abdominal pain, URI symptoms, fevers, or urinary symptoms.  She reports polyuria and polydipsia, has been drinking mostly water.  She states that she was told she was prediabetic but has never been on any medications for diabetes.  The history is provided by the patient.  Hyperglycemia    Past Medical History:  Diagnosis Date  . Anxiety   . Bipolar 1 disorder (HCC)   . Borderline personality disorder (HCC)   . Borderline personality disorder (HCC)   . Cervical dysplasia   . GERD (gastroesophageal reflux disease)   . History of vulvar dysplasia    vin I  --- s/p  wide excision 06-21-2012   . Hypothyroidism   . Moderate major depression (HCC)   . Morbid obesity (HCC)   . NAFL (nonalcoholic fatty liver)   . Pre-diabetes   . Premature ovarian failure   . Wears glasses     Patient Active Problem List   Diagnosis Date Noted  . Acute tear lateral meniscus, left, initial encounter 04/15/2017  . 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 Bipolar 1 disorder, depressed (HCC) 04/16/2012  . Depression, major, recurrent, moderate (HCC) 04/01/2012    Past Surgical History:  Procedure Laterality Date  . CARPAL TUNNEL RELEASE Bilateral LEFT  2013//  RIGHT  JAN  2015  . CHOLECYSTECTOMY  2011  . HYSTEROSCOPY W/D&C   10/17/2010   Procedure: DILATATION AND CURETTAGE (D&C) /HYSTEROSCOPY;  Surgeon: Jessee Avers;  Location: [facility];  Service: Gynecology;  Laterality: N/A;  . HYSTEROSCOPY W/D&C N/A 06/21/2012   Procedure: DILATATION AND CURETTAGE /HYSTEROSCOPY;  Surgeon: [name], MD;  Location: [facility];  Service: Gynecology;  Laterality: N/A;  . KNEE ARTHROSCOPY WITH MEDIAL MENISECTOMY Left 04/15/2017   Procedure: Left knee arthroscopic partial medial, lateral and partial menisectomies with lateral release;  Surgeon: Yolonda Kida, MD;  Location: [facility];  Service: Orthopedics;  Laterality: Left;  . LAPAROSCOPIC TUBAL LIGATION  01/09/2011   Procedure: LAPAROSCOPIC TUBAL LIGATION;  Surgeon: [name];  Location: [facility];  Service: Gynecology;  Laterality: Bilateral;  with Filshie clips  . LEEP  01/09/2011   Procedure: LOOP ELECTROSURGICAL EXCISION PROCEDURE (LEEP);  Surgeon: [name];  Location: [facility];  Service: Gynecology;  Laterality: N/A;  . LEEP N/A 09/16/2013   Procedure: LOOP ELECTROSURGICAL EXCISION PROCEDURE (LEEP);  Surgeon: [name], MD;  Location: [facility];  Service: Gynecology;  Laterality: N/A;  . NEGATIVE SLEEP STUDY  2014   per pt  . ORIF LEFT FOOT FX  2008   HARDWARE  REMOVED 6 MONTHS LATER  . STOMACH SURGERY  [facility]ERA GASTRIC [facility] JULY 2018  . VULVECTOMY N/A 06/21/2012   Procedure: WIDE EXCISION VULVECTOMY;  Surgeon: [name], MD;  Location: [facility];  Service: Gynecology;  Laterality: N/A;  excision of vulvar lesions,  need colposcope     OB History    Gravida  0   Para      Term      Preterm      AB      Living        SAB      TAB      Ectopic      Multiple      Live Births               Home Medications    Prior to Admission medications   Medication Sig Start Date End Date Taking? Authorizing Provider  PERPHENAZINE PO Take by mouth.   Yes [provider]  albuterol (PROVENTIL HFA;VENTOLIN  HFA) 108 (90 Base) MCG/ACT inhaler Inhale 1-2 puffs into the lungs every 6 (six) hours as needed for wheezing or shortness of breath. Patient not taking: Reported on 04/19/2018 12/11/17   Long, Arlyss Repress, MD  amantadine (SYMMETREL) 100 MG capsule Take 100 mg by mouth 2 (two) times daily.    [provider]  buPROPion (WELLBUTRIN XL) 150 MG 24 hr tablet Take 450 mg by mouth daily.     [provider]  divalproex (DEPAKOTE ER) 500 MG 24 hr tablet Take 500 mg by mouth 2 (two) times daily.     [provider]  doxycycline (VIBRAMYCIN) 100 MG capsule Take 100 mg by mouth 2 (two) times daily. 04/17/18   [provider]  DULoxetine (CYMBALTA) 60 MG capsule Take 60 mg by mouth 2 (two) times daily.     [provider]  hydrOXYzine (ATARAX/VISTARIL) 10 MG tablet Take 10 mg by mouth 2 (two) times daily.     [provider]  ibuprofen (ADVIL,MOTRIN) 200 MG tablet Take 400 mg by mouth every 6 (six) hours as needed for headache or moderate pain.    [provider]  levothyroxine (SYNTHROID, LEVOTHROID) 125 MCG tablet Take 250 mcg by mouth daily before breakfast.     [provider]  metFORMIN (GLUCOPHAGE) 500 MG tablet Take 1 tablet (500 mg total) by mouth 2 (two) times daily with a meal for 30 days. 04/21/18 05/21/18  Chasten Blaze, Ambrose Finland, MD  perphenazine (TRILAFON) 4 MG tablet Take 4 mg by mouth at bedtime.    [provider]  zolpidem (AMBIEN) 10 MG tablet Take 10 mg by mouth at bedtime.     [provider]    Family History Family History  Problem Relation Age of Onset  . Cancer Mother   . Mental illness Mother   . Alcohol abuse Mother   . Drug abuse Mother   . Bipolar disorder Sister   . Alcohol abuse Maternal Aunt   . Drug abuse Maternal Aunt   . Depression Maternal Aunt   . Schizophrenia Maternal Aunt   . Alcohol abuse Maternal Uncle   . Alcohol abuse Maternal Grandfather   . Diabetes Father     Social  History Social History   Tobacco Use  . Smoking status: Current Every Day Smoker    Packs/day: 0.50    Years: 25.00    Pack years: 12.50    Types: Cigarettes  . Smokeless tobacco: Never Used  Substance Use Topics  . Alcohol use: No  . Drug use: No     Allergies   Codeine; Hydrocodone; Oxycodone; Trazodone and nefazodone; and Vicodin [hydrocodone-acetaminophen]   Review of Systems Review of Systems All other systems reviewed and are negative except  that which was mentioned in HPI   Physical Exam Updated Vital Signs BP (!) 135/102   Pulse (!) 102   Temp 98.1 F (36.7 C) (Oral)   Resp 20   Ht 5\' 4"  (1.626 m)   Wt (!) 156.8 kg   SpO2 100%   BMI 59.34 kg/m   Physical Exam Vitals signs and nursing note reviewed.  Constitutional:      General: She is not in acute distress.    Appearance: She is well-developed.  HENT:     Head: Normocephalic and atraumatic.     Mouth/Throat:     Mouth: Mucous membranes are dry.     Pharynx: Oropharynx is clear.  Eyes:     Conjunctiva/sclera: Conjunctivae normal.     Pupils: Pupils are equal, round, and reactive to light.  Neck:     Musculoskeletal: Neck supple.  Cardiovascular:     Rate and Rhythm: Normal rate and regular rhythm.     Heart sounds: Normal heart sounds. No murmur.  Pulmonary:     Effort: Pulmonary effort is normal.     Breath sounds: Normal breath sounds.  Abdominal:     General: Bowel sounds are normal. There is no distension.     Palpations: Abdomen is soft.     Tenderness: There is no abdominal tenderness.  Skin:    General: Skin is warm and dry.  Neurological:     Mental Status: She is alert and oriented to person, place, and time.     Comments: Fluent speech  Psychiatric:     Comments: Tremulous, mildly anxious      ED Treatments / Results  Labs (all labs ordered are listed, but only abnormal results are displayed) Labs Reviewed  URINALYSIS, ROUTINE W REFLEX MICROSCOPIC - Abnormal; Notable for  the following components:      Result Value   Color, Urine STRAW (*)    Specific Gravity, Urine <1.005 (*)    Glucose, UA >=500 (*)    All other components within normal limits  COMPREHENSIVE METABOLIC PANEL - Abnormal; Notable for the following components:   Sodium 132 (*)    Glucose, Bld 357 (*)    AST 50 (*)    ALT 62 (*)    All other components within normal limits  URINALYSIS, MICROSCOPIC (REFLEX) - Abnormal; Notable for the following components:   Bacteria, UA RARE (*)    All other components within normal limits  CBG MONITORING, ED - Abnormal; Notable for the following components:   Glucose-Capillary 419 (*)    All other components within normal limits  CBG MONITORING, ED - Abnormal; Notable for the following components:   Glucose-Capillary 260 (*)    All other components within normal limits  PREGNANCY, URINE  CBC    EKG None  Radiology No results found.  Procedures Procedures (including critical care time)  Medications Ordered in ED Medications  sodium chloride 0.9 % bolus 1,000 mL ( Intravenous Stopped 04/21/18 1258)     Initial Impression / Assessment and Plan / ED Course  I have reviewed the triage vital signs and the nursing notes.  Pertinent labs that were available during my care of the patient were reviewed by me and considered in my medical decision making (see chart for details).    No infectious symptoms, mild tachycardia on exam. Anxious. Initial BG 357 on CMP, no evidence of DKA. Mildly elevated LFTs c/w h/o fatty liver. UA without infection.  Gave fluid bolus and blood glucose improved to  260.  She has been drinking liquids here without problems.  Because of her severe hyperglycemia, recommended initiating diabetes treatment with metformin.  I explained that she would need to see PCP very closely for titration of this medication and any additional work-up and treatment.  Have spent at least 5 minutes educating patient on diabetic diet and the  importance of staying hydrated.  Reviewed return precautions and she voiced understanding.  Final Clinical Impressions(s) / ED Diagnoses   Final diagnoses:  Hyperglycemia    ED Discharge Orders         Ordered    metFORMIN (GLUCOPHAGE) 500 MG tablet  2 times daily with meals     04/21/18 1346           Luisdaniel Kenton, Ambrose Finlandachel Morgan, MD 04/21/18 1408

## 2018-04-21 NOTE — ED Triage Notes (Signed)
Pt reports checked glucose at home- 439; sts she is pre-diabetic and not on any meds for DM; was feeling shaky and had dry heaves

## 2018-04-27 ENCOUNTER — Inpatient Hospital Stay (HOSPITAL_COMMUNITY)
Admission: RE | Admit: 2018-04-27 | Discharge: 2018-04-27 | Disposition: A | Discharge status: Home / Self Care | Payer: Self-pay | Source: Ambulatory Visit

## 2018-04-27 NOTE — Progress Notes (Addendum)
In reviewing pt. Chart noted that she was seen at the [facility] on 04-21-18. Pt came in because she had check her blood sugar at home and it was over 400. Blood sugar was 357 on CMET done at the med Center. She was placed on Metformin 500 mg 2 times daily and ask to follow up with PCP. The patient already has an endocrinologist Dr. Alda Berthold who manages her thyroid issuses. I called Dr. Eliane Decree office and they were unaware of this issue  Dr. Eliane Decree office requested that I ask her to make an appointment to see them ASAP,which I will do when she comes for pre-admission appointment today. Discussed the problem with Antionette Poles PA-C.Marland Kitchen  Pt. Did not show up for pre-admit appointment.   Called pt. At home ,she could not give me a reason why she did not come.  Spoke with her about her visit to [facility].States that she was told she is diabetic,says she is checking blood sugars 4-6 times daily and they are running between 260-320. Informed pt. That she needed to make an appointment with her endocrinologist since she is now taking medication for diabetes before her surgery. Pt. States that she will call today to make an appointment.  Short Stay scheduler will call pt. Tomorrow to re-schedule her pre-op appointment

## 2018-04-27 NOTE — Pre-Procedure Instructions (Signed)
Solon PalmMichelle A Darwish  04/27/2018      [facility] [place] [address] [id number] [facility] [address] [facility] [address] [place] [address] Phone: [phone] Fax: [phone]    Your procedure is scheduled on 05-04-2008 Tuesday.   [facility].M[facility]  Call this number if you have problems the morning of surgery:  [phone]   Remember:  Do not eat   Food after midnight.  You may drink clear liquids until 4:30.  Clear liquids allowed are:                    Water, Juice (non-citric and without pulp), Carbonated beverages, Clear Tea, Black Coffee only and Gatorade    Take these medicines the morning of surgery with A SIP OF WATER   Amantadine(Symmetrel) Bupropion(Wellbutrin XL) Depakote ER Duloxetine(Cymbalta) Hydroxyzine(attarax/vistaril) Levothyroxine(Synthroid) Perphenazine(Trilafon)   STOP TAKING ANY ASPIRIN  (UNLESS OTHERWISE INSTRUCTED BY YOUR SURGEON),ANTIINFLAMATORIES (IBUPROFEN,ALEVE,MOTRIN,ADVIL,GOODY'S POWDERS),HERBAL SUPPLEMENTS,FISH OIL,AND VITAMINS 5-7 DAYS PRIOR TO SURGERY     How to Manage Your Diabetes Before and After Surgery  Why is it important to control my blood sugar before and after surgery? . Improving blood sugar levels before and after surgery helps healing and can limit problems. . A way of improving blood sugar control is eating a healthy diet by: o  Eating less sugar and carbohydrates o  Increasing activity/exercise o  Talking with your doctor about reaching your blood sugar goals . High blood sugars (greater than 180 mg/dL) can raise your risk of infections and slow your recovery, so you will need to focus on controlling your diabetes during the weeks before surgery. . Make sure that the doctor who takes care of your diabetes knows about your planned surgery including the date and location.  How do I manage my blood sugar before surgery? . Check your blood sugar at least 4 times a  day, starting 2 days before surgery, to make sure that the level is not too high or low. o Check your blood sugar the morning of your surgery when you wake up and every 2 hours until you get to the Short Stay unit. . If your blood sugar is less than 70 mg/dL, you will need to treat for low blood sugar: o Do not take insulin. o Treat a low blood sugar (less than 70 mg/dL) with  cup of clear juice (cranberry or apple), 4 glucose tablets, OR glucose gel. Recheck blood sugar in 15 minutes after treatment (to make sure it is greater than 70 mg/dL). If your blood sugar is not greater than 70 mg/dL on recheck, call [phone] o  for further instructions. . Report your blood sugar to the short stay nurse when you get to Short Stay.  . If you are admitted to the hospital after surgery: o Your blood sugar will be checked by the staff and you will probably be given insulin after surgery (instead of oral diabetes medicines) to make sure you have good blood sugar levels. o The goal for blood sugar control after surgery is 80-180 mg/dL.              WHAT DO I DO ABOUT MY DIABETES MEDICATION?   Marland Kitchen. Do not take oral diabetes medicines (pills) the morning of surgery.Metformin(glucophage  ).  .    Other Instructions:          :  Date:   :  Date:  Reviewed and Endorsed by [facility] [place] Patient Education Committee, August 2015   Do not wear jewelry, make-up or nail polish.  Do not wear lotions, powders, or perfumes,   Do not shave 48 hours prior to surgery.  .  Do not bring valuables to the hospital.  [facility] is not responsible for any belongings or valuables.  [facility] - Preparing for Surgery  Before surgery, you can play an important role.  Because skin is not sterile, your skin needs to be as free of germs as possible.  You can reduce the number of germs on you skin by washing with CHG (chlorahexidine gluconate) soap before surgery.  CHG is an antiseptic cleaner which kills  germs and bonds with the skin to continue killing germs even after washing.  Oral Hygiene is also important in reducing the risk of infection.  Remember to brush your teeth with your regular toothpaste the morning of surgery.  Please DO NOT use if you have an allergy to CHG or antibacterial soaps.  If your skin becomes reddened/irritated stop using the CHG and inform your nurse when you arrive at Short Stay.  Do not shave (including legs and underarms) for at least 48 hours prior to the first CHG shower.  You may shave your face.  Please follow these instructions carefully:   1.  Shower with CHG Soap the night before surgery and the morning of Surgery.  2.  If you choose to wash your hair, wash your hair first as usual with your normal shampoo.  3.  After you shampoo, rinse your hair and body thoroughly to remove the shampoo. 4.  Use CHG as you would any other liquid soap.  You can apply chg directly to the skin and wash gently with a      scrungie or washcloth.           5.  Apply the CHG Soap to your body ONLY FROM THE NECK DOWN.   Do not use on open wounds or open sores. Avoid contact with your eyes, ears, mouth and genitals (private parts).  Wash genitals (private parts) with your normal soap.  6.  Wash thoroughly, paying special attention to the area where your surgery will be performed.  7.  Thoroughly rinse your body with warm water from the neck down.  8.  DO NOT shower/wash with your normal soap after using and rinsing off the CHG Soap.  9.  Pat yourself dry with a clean towel.            10.  Wear clean pajamas.            11.  Place clean sheets on your bed the night of your first shower and do not sleep with pets.  Day of Surgery  Do not apply any lotions the morning of surgery.   Please wear clean clothes to the hospital/surgery center. Remember to brush your teeth with toothpaste.    Contacts, dentures or bridgework may not be worn into surgery.  Leave your suitcase in the  car.  After surgery it may be brought to your room.  For patients admitted to the hospital, discharge time will be determined by your treatment team.  Patients discharged the day of surgery will not be allowed to drive home.      Please read over the following fact sheets that you were given. Pain Booklet and Surgical Site Infection Prevention

## 2018-04-27 NOTE — Progress Notes (Signed)
Called Dr. Allena Katz office pt. Had called for appointment but the first available available is 05-07-2018.  Called Dr. Dennie Bible office and spoke with Corrie Dandy, Dr.Holland is out of the state this week and he is scheduled to see pt. On 05-03-2018 in his office. Explained that pt. Really should be seen by Dr. Allena Katz prior to surgery. Corrie Dandy said she let Herbert Seta their surgical scheduler know the situation. She will call Short Stay Scheduler and let her know if and when we need to re-schedule her pre-admit appointment.  All this information was relayed to Antionette Poles PA-C anesthesia.

## 2018-05-03 ENCOUNTER — Other Ambulatory Visit (HOSPITAL_COMMUNITY): Payer: Self-pay | Admitting: Obstetrics and Gynecology

## 2018-05-03 ENCOUNTER — Ambulatory Visit (HOSPITAL_COMMUNITY)
Admission: RE | Admit: 2018-05-03 | Discharge: 2018-05-03 | Disposition: A | Discharge status: Home / Self Care | Payer: Medicare Other | Source: Ambulatory Visit | Attending: Obstetrics and Gynecology | Admitting: Obstetrics and Gynecology

## 2018-05-03 DIAGNOSIS — R0789 Other chest pain: Secondary | ICD-10-CM

## 2018-05-04 ENCOUNTER — Other Ambulatory Visit: Payer: Self-pay

## 2018-05-04 ENCOUNTER — Ambulatory Visit (HOSPITAL_COMMUNITY): Payer: Medicare Other | Admitting: Physician Assistant

## 2018-05-04 ENCOUNTER — Encounter (HOSPITAL_COMMUNITY)
Admission: RE | Disposition: A | Discharge status: Home / Self Care | Payer: Self-pay | Source: Ambulatory Visit | Attending: Obstetrics and Gynecology

## 2018-05-04 ENCOUNTER — Ambulatory Visit (HOSPITAL_COMMUNITY)
Admission: RE | Admit: 2018-05-04 | Discharge: 2018-05-04 | Disposition: A | Discharge status: Home / Self Care | Payer: Medicare Other | Source: Ambulatory Visit | Attending: Obstetrics and Gynecology | Admitting: Obstetrics and Gynecology

## 2018-05-04 ENCOUNTER — Ambulatory Visit (HOSPITAL_COMMUNITY): Payer: Medicare Other | Admitting: Anesthesiology

## 2018-05-04 ENCOUNTER — Encounter (HOSPITAL_COMMUNITY): Payer: Self-pay

## 2018-05-04 DIAGNOSIS — F17210 Nicotine dependence, cigarettes, uncomplicated: Secondary | ICD-10-CM | POA: Diagnosis not present

## 2018-05-04 DIAGNOSIS — N840 Polyp of corpus uteri: Secondary | ICD-10-CM | POA: Insufficient documentation

## 2018-05-04 DIAGNOSIS — Z6841 Body Mass Index (BMI) 40.0 and over, adult: Secondary | ICD-10-CM | POA: Diagnosis not present

## 2018-05-04 DIAGNOSIS — Z79899 Other long term (current) drug therapy: Secondary | ICD-10-CM | POA: Diagnosis not present

## 2018-05-04 DIAGNOSIS — Z9049 Acquired absence of other specified parts of digestive tract: Secondary | ICD-10-CM | POA: Insufficient documentation

## 2018-05-04 DIAGNOSIS — Z79890 Hormone replacement therapy (postmenopausal): Secondary | ICD-10-CM | POA: Insufficient documentation

## 2018-05-04 DIAGNOSIS — N939 Abnormal uterine and vaginal bleeding, unspecified: Secondary | ICD-10-CM | POA: Diagnosis present

## 2018-05-04 DIAGNOSIS — F319 Bipolar disorder, unspecified: Secondary | ICD-10-CM | POA: Insufficient documentation

## 2018-05-04 DIAGNOSIS — F410 Panic disorder [episodic paroxysmal anxiety] without agoraphobia: Secondary | ICD-10-CM | POA: Insufficient documentation

## 2018-05-04 DIAGNOSIS — E039 Hypothyroidism, unspecified: Secondary | ICD-10-CM | POA: Diagnosis not present

## 2018-05-04 HISTORY — PX: DILATATION & CURETTAGE/HYSTEROSCOPY WITH MYOSURE: SHX6511

## 2018-05-04 LAB — BASIC METABOLIC PANEL
ANION GAP: 12 (ref 5–15)
BUN: 19 mg/dL (ref 6–20)
CO2: 24 mmol/L (ref 22–32)
Calcium: 8.9 mg/dL (ref 8.9–10.3)
Chloride: 98 mmol/L (ref 98–111)
Creatinine, Ser: 1.01 mg/dL — ABNORMAL HIGH (ref 0.44–1.00)
GFR calc Af Amer: >60 mL/min (ref >60)
GFR calc non Af Amer: >60 mL/min (ref >60)
GLUCOSE: 187 mg/dL — AB (ref 70–99)
Potassium: 3.7 mmol/L (ref 3.5–5.1)
Sodium: 134 mmol/L — ABNORMAL LOW (ref 135–145)

## 2018-05-04 LAB — CBC
HCT: 46.7 % — ABNORMAL HIGH (ref 36.0–46.0)
Hemoglobin: 16.1 g/dL — ABNORMAL HIGH (ref 12.0–15.0)
MCH: 31.3 pg (ref 26.0–34.0)
MCHC: 34.5 g/dL (ref 30.0–36.0)
MCV: 90.7 fL (ref 80.0–100.0)
Platelets: 369 10*3/uL (ref 150–400)
RBC: 5.15 MIL/uL — ABNORMAL HIGH (ref 3.87–5.11)
RDW: 13 % (ref 11.5–15.5)
WBC: 6.3 10*3/uL (ref 4.0–10.5)
nRBC: 0 % (ref 0.0–0.2)

## 2018-05-04 LAB — TYPE AND SCREEN
ABO/RH(D): A POS
ANTIBODY SCREEN: NEGATIVE

## 2018-05-04 LAB — GLUCOSE, CAPILLARY
Glucose-Capillary: 187 mg/dL — ABNORMAL HIGH (ref 70–99)
Glucose-Capillary: 160 mg/dL — ABNORMAL HIGH (ref 70–99)

## 2018-05-04 LAB — ABO/RH: ABO/RH(D): A POS

## 2018-05-04 SURGERY — DILATATION & CURETTAGE/HYSTEROSCOPY WITH MYOSURE
Anesthesia: General | Site: Vagina

## 2018-05-04 MED ORDER — LIDOCAINE HCL (CARDIAC) PF 100 MG/5ML IV SOSY
PREFILLED_SYRINGE | INTRAVENOUS | Status: DC | PRN
  Administered 2018-05-04: 100 mg via INTRAVENOUS

## 2018-05-04 MED ORDER — SCOPOLAMINE 1 MG/3DAYS TD PT72
MEDICATED_PATCH | TRANSDERMAL | Status: AC
  Administered 2018-05-04: 1.5 mg via TRANSDERMAL
  Filled 2018-05-04: qty 1

## 2018-05-04 MED ORDER — MEPERIDINE HCL 25 MG/ML IJ SOLN: 6.2500 mg | INTRAMUSCULAR | Status: DC | PRN

## 2018-05-04 MED ORDER — PROPOFOL 10 MG/ML IV BOLUS
INTRAVENOUS | Status: AC
  Filled 2018-05-04: qty 20

## 2018-05-04 MED ORDER — KETOROLAC TROMETHAMINE 30 MG/ML IJ SOLN
30.0000 mg | Freq: Once | INTRAMUSCULAR | Status: AC | PRN
  Administered 2018-05-04: 30 mg via INTRAVENOUS

## 2018-05-04 MED ORDER — FENTANYL CITRATE (PF) 100 MCG/2ML IJ SOLN
25.0000 ug | INTRAMUSCULAR | Status: DC | PRN
  Administered 2018-05-04: 25 ug via INTRAVENOUS

## 2018-05-04 MED ORDER — DEXAMETHASONE SODIUM PHOSPHATE 10 MG/ML IJ SOLN
INTRAMUSCULAR | Status: DC | PRN
  Administered 2018-05-04: 4 mg via INTRAVENOUS

## 2018-05-04 MED ORDER — KETOROLAC TROMETHAMINE 30 MG/ML IJ SOLN
INTRAMUSCULAR | Status: AC
  Filled 2018-05-04: qty 1

## 2018-05-04 MED ORDER — LIDOCAINE HCL (CARDIAC) PF 100 MG/5ML IV SOSY
PREFILLED_SYRINGE | INTRAVENOUS | Status: AC
  Filled 2018-05-04: qty 5

## 2018-05-04 MED ORDER — LIDOCAINE HCL 1 % IJ SOLN
INTRAMUSCULAR | Status: AC
  Filled 2018-05-04: qty 20

## 2018-05-04 MED ORDER — DEXAMETHASONE SODIUM PHOSPHATE 4 MG/ML IJ SOLN
INTRAMUSCULAR | Status: AC
  Filled 2018-05-04: qty 1

## 2018-05-04 MED ORDER — PHENYLEPHRINE HCL 10 MG/ML IJ SOLN
INTRAMUSCULAR | Status: DC | PRN
  Administered 2018-05-04 (×2): 80 ug via INTRAVENOUS
  Administered 2018-05-04: 40 ug via INTRAVENOUS
  Administered 2018-05-04 (×4): 80 ug via INTRAVENOUS

## 2018-05-04 MED ORDER — PROMETHAZINE HCL 25 MG/ML IJ SOLN: 6.2500 mg | INTRAMUSCULAR | Status: DC | PRN

## 2018-05-04 MED ORDER — ONDANSETRON HCL 4 MG/2ML IJ SOLN
INTRAMUSCULAR | Status: AC
  Filled 2018-05-04: qty 2

## 2018-05-04 MED ORDER — MIDAZOLAM HCL 2 MG/2ML IJ SOLN
INTRAMUSCULAR | Status: DC | PRN
  Administered 2018-05-04: 2 mg via INTRAVENOUS

## 2018-05-04 MED ORDER — SODIUM CHLORIDE 0.9 % IV SOLN
INTRAVENOUS | Status: AC
  Filled 2018-05-04: qty 2

## 2018-05-04 MED ORDER — LIDOCAINE HCL 1 % IJ SOLN
INTRAMUSCULAR | Status: DC | PRN
  Administered 2018-05-04: 10 mL

## 2018-05-04 MED ORDER — IBUPROFEN 100 MG/5ML PO SUSP
200.0000 mg | Freq: Four times a day (QID) | ORAL | Status: DC | PRN
  Filled 2018-05-04: qty 20

## 2018-05-04 MED ORDER — PHENYLEPHRINE 40 MCG/ML (10ML) SYRINGE FOR IV PUSH (FOR BLOOD PRESSURE SUPPORT)
PREFILLED_SYRINGE | INTRAVENOUS | Status: AC
  Filled 2018-05-04: qty 20

## 2018-05-04 MED ORDER — IBUPROFEN 200 MG PO TABS
200.0000 mg | ORAL_TABLET | Freq: Four times a day (QID) | ORAL | Status: DC | PRN
  Filled 2018-05-04: qty 2

## 2018-05-04 MED ORDER — FENTANYL CITRATE (PF) 100 MCG/2ML IJ SOLN
INTRAMUSCULAR | Status: AC
  Filled 2018-05-04: qty 2

## 2018-05-04 MED ORDER — EPHEDRINE 5 MG/ML INJ
INTRAVENOUS | Status: AC
  Filled 2018-05-04: qty 10

## 2018-05-04 MED ORDER — FENTANYL CITRATE (PF) 100 MCG/2ML IJ SOLN
INTRAMUSCULAR | Status: DC | PRN
  Administered 2018-05-04: 100 ug via INTRAVENOUS

## 2018-05-04 MED ORDER — SODIUM CHLORIDE 0.9 % IR SOLN
Status: DC | PRN
  Administered 2018-05-04: 3000 mL

## 2018-05-04 MED ORDER — MIDAZOLAM HCL 2 MG/2ML IJ SOLN
INTRAMUSCULAR | Status: AC
  Filled 2018-05-04: qty 2

## 2018-05-04 MED ORDER — LACTATED RINGERS IV SOLN
INTRAVENOUS | Status: DC
  Administered 2018-05-04: 100 mL/h via INTRAVENOUS

## 2018-05-04 MED ORDER — SCOPOLAMINE 1 MG/3DAYS TD PT72
1.0000 | MEDICATED_PATCH | TRANSDERMAL | Status: DC
  Administered 2018-05-04: 1.5 mg via TRANSDERMAL

## 2018-05-04 MED ORDER — EPHEDRINE SULFATE 50 MG/ML IJ SOLN
INTRAMUSCULAR | Status: DC | PRN
  Administered 2018-05-04 (×2): 10 mg via INTRAVENOUS

## 2018-05-04 MED ORDER — PROPOFOL 10 MG/ML IV BOLUS
INTRAVENOUS | Status: DC | PRN
  Administered 2018-05-04: 200 mg via INTRAVENOUS

## 2018-05-04 MED ORDER — ONDANSETRON HCL 4 MG/2ML IJ SOLN
INTRAMUSCULAR | Status: DC | PRN
  Administered 2018-05-04: 4 mg via INTRAVENOUS

## 2018-05-04 MED ORDER — SODIUM CHLORIDE 0.9 % IV SOLN
2.0000 g | INTRAVENOUS | Status: AC
  Administered 2018-05-04: 2 g via INTRAVENOUS

## 2018-05-04 SURGICAL SUPPLY — 14 items
ABLATOR SURESOUND NOVASURE (ABLATOR) ×3 IMPLANT
CATH ROBINSON RED A/P 16FR (CATHETERS) ×3 IMPLANT
DEVICE MYOSURE LITE (MISCELLANEOUS) ×3 IMPLANT
DEVICE MYOSURE REACH (MISCELLANEOUS) IMPLANT
GLOVE BIO SURGEON STRL SZ7 (GLOVE) ×3 IMPLANT
GLOVE BIOGEL PI IND STRL 7.0 (GLOVE) ×1 IMPLANT
GLOVE BIOGEL PI INDICATOR 7.0 (GLOVE) ×2
GOWN STRL REUS W/TWL LRG LVL3 (GOWN DISPOSABLE) ×6 IMPLANT
HIBICLENS CHG 4% 4OZ BTL (MISCELLANEOUS) ×3 IMPLANT
KIT PROCEDURE FLUENT (KITS) ×3 IMPLANT
PACK VAGINAL MINOR WOMEN LF (CUSTOM PROCEDURE TRAY) ×3 IMPLANT
PAD OB MATERNITY 4.3X12.25 (PERSONAL CARE ITEMS) ×3 IMPLANT
SEAL ROD LENS SCOPE MYOSURE (ABLATOR) ×3 IMPLANT
TOWEL OR 17X24 6PK STRL BLUE (TOWEL DISPOSABLE) ×6 IMPLANT

## 2018-05-04 NOTE — Anesthesia Preprocedure Evaluation (Signed)
Anesthesia Evaluation  Patient identified by MRN, date of birth, ID band Patient awake    Reviewed: Allergy & Precautions, H&P , NPO status , Patient's Chart, lab work & pertinent test results  Airway Mallampati: III  TM Distance: >3 FB Neck ROM: Full    Dental no notable dental hx. (+) Teeth Intact, Dental Advisory Given   Pulmonary Current Smoker,    Pulmonary exam normal breath sounds clear to auscultation       Cardiovascular negative cardio ROS Normal cardiovascular exam Rhythm:Regular Rate:Normal     Neuro/Psych PSYCHIATRIC DISORDERS Anxiety Depression Bipolar Disorder negative neurological ROS     GI/Hepatic Neg liver ROS, GERD  Medicated,  Endo/Other  Hypothyroidism Morbid obesity  Renal/GU negative Renal ROS  negative genitourinary   Musculoskeletal   Abdominal (+) + obese,   Peds  Hematology negative hematology ROS (+)   Anesthesia Other Findings   Reproductive/Obstetrics negative OB ROS                             Anesthesia Physical  Anesthesia Plan  ASA: III  Anesthesia Plan: General   Post-op Pain Management:    Induction: Intravenous  PONV Risk Score and Plan: 3 and Ondansetron, Dexamethasone and Midazolam  Airway Management Planned: LMA  Additional Equipment:   Intra-op Plan:   Post-operative Plan: Extubation in OR  Informed Consent: I have reviewed the patients History and Physical, chart, labs and discussed the procedure including the risks, benefits and alternatives for the proposed anesthesia with the patient or authorized representative who has indicated his/her understanding and acceptance.     Dental advisory given  Plan Discussed with: CRNA  Anesthesia Plan Comments:         Anesthesia Quick Evaluation

## 2018-05-04 NOTE — Anesthesia Postprocedure Evaluation (Signed)
Anesthesia Post Note  Patient: Lindsey Nicholson  Procedure(s) Performed: DILATATION & CURETTAGE/HYSTEROSCOPY WITH MYOSURE  POLYPECTOMY AND FAILED NOVASURE ABLATION (N/A Vagina )     Patient location during evaluation: PACU Anesthesia Type: General Level of consciousness: awake Pain management: pain level controlled Vital Signs Assessment: post-procedure vital signs reviewed and stable Respiratory status: spontaneous breathing Cardiovascular status: stable Postop Assessment: no apparent nausea or vomiting Anesthetic complications: no    Last Vitals:  Vitals:   05/04/18 0915 05/04/18 0945  BP: 123/87 112/77  Pulse: (!) 103 (!) 101  Resp: 16 16  Temp:  36.8 C  SpO2: 94% 96%    Last Pain:  Vitals:   05/04/18 0945  TempSrc:   PainSc: 3    Pain Goal: Patients Stated Pain Goal: 3 (05/04/18 0629)                 Caren Macadam

## 2018-05-04 NOTE — Transfer of Care (Signed)
Immediate Anesthesia Transfer of Care Note  Patient: Lindsey Nicholson  Procedure(s) Performed: DILATATION & CURETTAGE/HYSTEROSCOPY WITH MYOSURE  POLYPECTOMY AND FAILED NOVASURE ABLATION (N/A Vagina )  Patient Location: PACU  Anesthesia Type:General  Level of Consciousness: awake, alert  and oriented  Airway & Oxygen Therapy: Patient Spontanous Breathing and Patient connected to nasal cannula oxygen  Post-op Assessment: Report given to RN, Post -op Vital signs reviewed and stable and Patient moving all extremities  Post vital signs: Reviewed and stable  Last Vitals:  Vitals Value Taken Time  BP    Temp    Pulse 67 05/04/2018  8:12 AM  Resp 14 05/04/2018  8:13 AM  SpO2 91 % 05/04/2018  8:12 AM  Vitals shown include unvalidated device data.  Last Pain:  Vitals:   05/04/18 0629  TempSrc: Oral  PainSc: 0-No pain      Patients Stated Pain Goal: 3 (05/04/18 [id number])  Complications: No apparent anesthesia complications

## 2018-05-04 NOTE — Progress Notes (Signed)
Pt seen and examined pre op, CXR yest NEG, recently dx with AODM, started on Metformin

## 2018-05-04 NOTE — Discharge Instructions (Signed)
DISCHARGE INSTRUCTIONS: D&C/ HYSTEROSCOPY / MYOSURE POLYPECTOMY The following instructions have been prepared to help you care for yourself upon your return home.  May Remove Scop patch behind left ear on or before Friday(WASH HANDS WELL AFTER DISCARD[facility])  May take Ibuprofen after 1:25 PM TODAY**   Personal hygiene:  Use sanitary pads for vaginal drainage, not tampons.  Shower the day after your procedure.  NO tub baths, pools or Jacuzzis for 2-3 weeks.  Wipe front to back after using the bathroom.  Activity and limitations:  Do NOT drive or operate any equipment for 24 hours. The effects of anesthesia are still present and drowsiness may result.  Do NOT rest in bed all day.  Walking is encouraged.  Walk up and down stairs slowly.  You may resume your normal activity in one to two days or as indicated by your physician. Sexual activity: NO intercourse for at least 2 weeks after the procedure, or as indicated by your Doctor.  Diet: Eat a light meal as desired this evening. You may resume your usual diet tomorrow.  Return to Work: You may resume your work activities in one to two days or as indicated by Therapist, sports.  What to expect after your surgery: Expect to have vaginal bleeding/discharge for 2-3 days and spotting for up to 10 days. It is not unusual to have soreness for up to 1-2 weeks. You may have a slight burning sensation when you urinate for the first day. Mild cramps may continue for a couple of days. You may have a regular period in 2-6 weeks.  Call your doctor for any of the following:  Excessive vaginal bleeding or clotting, saturating and changing one pad every hour.  Inability to urinate 6 hours after discharge from hospital.  Pain not relieved by pain medication.  Fever of 100.4 F or greater.  Unusual vaginal discharge or odor.

## 2018-05-04 NOTE — Op Note (Signed)
Preoperative diagnosis: Abnormal uterine bleeding, endometrial polypPatient ID: Lindsey Nicholson, female   DOB: 10/22/1970, 48 y.o.   MRN: [id number]    Preoperative diagnosis: Abnormal uterine bleeding, endometrial polyp  Postoperative diagnosis: Same  Procedure: D&C, hysteroscopy, MyoSure resection of endometrial polyp, failed NovaSure ablation  Surgeon: Marcelle Overlie  Anesthesia: General plus paracervical block  EBL: Less than 10 cc  Procedure findings:  The patient was taken to the operating room after an adequate level of general anesthesia was obtained with the patient's legs in stirrups the perineum and vagina were prepped and draped in the bladder was drained.  Appropriate timeouts were taken at that point.  Weighted speculum was positioned, cervix grasped anteriorly paracervical block was then created by infiltrating at 3 and 9:00 submucosally, 5 to 7 cc of 1% Xylocaine at each side after negative aspiration.  Uterus was then sounded to 8 cm progressively dilated to 27 Pratt dilator.  The continuous-flow hysteroscope was inserted revealing some shaggy endometrial buildup and a endometrial polyp emanating from the left cornual area. The MyoSure lite was positioned, the polyp was completely resected down to the surrounding level, sharp curettage was carried out, the scope was reinserted and cavity cleaned out all specimen sent to pathology cavity was noted to be clean.  Cervical length 2.0 however I could not engage the NovaSure device for the width primarily.  This was tried several times without being able to engage.  She has not been pregnant before.  Procedure was terminated at that point with the fluid deficit 90 cc She tolerated this well went to recovery room in good condition.  Dictated with Dragon medical 1  Return Lindsey Obey MD

## 2018-05-04 NOTE — Anesthesia Procedure Notes (Signed)
Procedure Name: LMA Insertion Date/Time: 05/04/2018 7:26 AM Performed by: Elgie Congo, CRNA Pre-anesthesia Checklist: Patient identified, Emergency Drugs available, Suction available and Patient being monitored Patient Re-evaluated:Patient Re-evaluated prior to induction Oxygen Delivery Method: Circle system utilized Preoxygenation: Pre-oxygenation with 100% oxygen Induction Type: IV induction LMA: LMA inserted LMA Size: 4.0 Number of attempts: 1 Placement Confirmation: positive ETCO2 and breath sounds checked- equal and bilateral Tube secured with: Tape Dental Injury: Teeth and Oropharynx as per pre-operative assessment

## 2018-05-05 ENCOUNTER — Encounter (HOSPITAL_COMMUNITY): Payer: Self-pay | Admitting: Obstetrics and Gynecology

## 2018-09-09 ENCOUNTER — Other Ambulatory Visit: Payer: Self-pay

## 2018-09-09 ENCOUNTER — Encounter (HOSPITAL_BASED_OUTPATIENT_CLINIC_OR_DEPARTMENT_OTHER): Payer: Self-pay | Admitting: *Deleted

## 2018-09-09 ENCOUNTER — Emergency Department (HOSPITAL_BASED_OUTPATIENT_CLINIC_OR_DEPARTMENT_OTHER): Payer: Medicare Other

## 2018-09-09 ENCOUNTER — Emergency Department (HOSPITAL_BASED_OUTPATIENT_CLINIC_OR_DEPARTMENT_OTHER)
Admission: EM | Admit: 2018-09-09 | Discharge: 2018-09-09 | Disposition: A | Discharge status: Home / Self Care | Payer: Medicare Other | Attending: Emergency Medicine | Admitting: Emergency Medicine

## 2018-09-09 DIAGNOSIS — F17210 Nicotine dependence, cigarettes, uncomplicated: Secondary | ICD-10-CM | POA: Insufficient documentation

## 2018-09-09 DIAGNOSIS — M25562 Pain in left knee: Secondary | ICD-10-CM | POA: Diagnosis not present

## 2018-09-09 DIAGNOSIS — Z79899 Other long term (current) drug therapy: Secondary | ICD-10-CM | POA: Diagnosis not present

## 2018-09-09 MED ORDER — ACETAMINOPHEN 500 MG PO TABS
1000.0000 mg | ORAL_TABLET | Freq: Once | ORAL | Status: AC
  Administered 2018-09-09: 1000 mg via ORAL
  Filled 2018-09-09: qty 2

## 2018-09-09 MED ORDER — KETOROLAC TROMETHAMINE 30 MG/ML IJ SOLN
30.0000 mg | Freq: Once | INTRAMUSCULAR | Status: AC
  Administered 2018-09-09: 20:00:00 30 mg via INTRAMUSCULAR
  Filled 2018-09-09: qty 1

## 2018-09-09 MED ORDER — FENTANYL CITRATE (PF) 100 MCG/2ML IJ SOLN
100.0000 ug | Freq: Once | INTRAMUSCULAR | Status: AC
  Administered 2018-09-09: 21:00:00 100 ug via NASAL
  Filled 2018-09-09: qty 2

## 2018-09-09 NOTE — ED Provider Notes (Signed)
[facility] EMERGENCY DEPARTMENT Provider Note   CSN: [id number] Arrival date & time: 09/09/18  1923    History   Chief Complaint Chief Complaint  Patient presents with  . Knee Pain    HPI Lindsey Nicholson is a 48 y.o. female.     The history is provided by medical records and the patient. No language interpreter was used.  Knee Pain  Lindsey Nicholson is a 48 y.o. female  with a PMH as listed below who presents to the Emergency Department complaining of left knee pain which began upon awakening this morning.  She has tried ibuprofen at home with little improvement.  Pain is worse with movement of the knee or ambulation.  She denies any recent injury or trauma.  No locking, catching or giving way.  History of previous left meniscus tear as well as several flares of pain to this knee in the past.  She is followed by orthopedics, Dr. Christell ConstantMoore.  She states that Dr. Christell ConstantMoore told her that this is due to arthritis.    Past Medical History:  Diagnosis Date  . Anxiety   . Bipolar 1 disorder (HCC)   . Borderline personality disorder (HCC)   . Borderline personality disorder (HCC)   . Cervical dysplasia   . GERD (gastroesophageal reflux disease)   . History of vulvar dysplasia    vin I  --- s/p  wide excision 06-21-2012   . Hypothyroidism   . Moderate major depression (HCC)   . Morbid obesity (HCC)   . NAFL (nonalcoholic fatty liver)   . Pre-diabetes   . Premature ovarian failure   . Wears glasses     Patient Active Problem List   Diagnosis Date Noted  . Acute tear lateral meniscus, left, initial encounter 04/15/2017  . 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 Bipolar 1 disorder, depressed (HCC) 04/16/2012  . Depression, major, recurrent, moderate (HCC) 04/01/2012    Past Surgical History:  Procedure Laterality Date  . CARPAL TUNNEL RELEASE Bilateral LEFT  2013//  RIGHT  JAN  2015  . CHOLECYSTECTOMY  2011  . DILATATION &  CURETTAGE/HYSTEROSCOPY WITH MYOSURE N/A 05/04/2018   Procedure: DILATATION & CURETTAGE/HYSTEROSCOPY WITH MYOSURE  POLYPECTOMY AND FAILED NOVASURE ABLATION;  Surgeon: Richarda OverlieHolland, Richard, MD;  Location: [facility];  Service: Gynecology;  Laterality: N/A;  . HYSTEROSCOPY W/D&C  10/17/2010   Procedure: DILATATION AND CURETTAGE (D&C) /HYSTEROSCOPY;  Surgeon: Jessee Aversara J. Cole;  Location: [facility];  Service: Gynecology;  Laterality: N/A;  . HYSTEROSCOPY W/D&C N/A 06/21/2012   Procedure: DILATATION AND CURETTAGE /HYSTEROSCOPY;  Surgeon: Meriel Picaichard M Holland, MD;  Location: [facility];  Service: Gynecology;  Laterality: N/A;  . KNEE ARTHROSCOPY WITH MEDIAL MENISECTOMY Left 04/15/2017   Procedure: Left knee arthroscopic partial medial, lateral and partial menisectomies with lateral release;  Surgeon: Yolonda Kidaogers, Jason Patrick, MD;  Location: [facility];  Service: Orthopedics;  Laterality: Left;  . LAPAROSCOPIC TUBAL LIGATION  01/09/2011   Procedure: LAPAROSCOPIC TUBAL LIGATION;  Surgeon: Meriel Picaichard M Holland;  Location: [facility];  Service: Gynecology;  Laterality: Bilateral;  with [place] clips  . LEEP  01/09/2011   Procedure: LOOP ELECTROSURGICAL EXCISION PROCEDURE (LEEP);  Surgeon: Meriel Picaichard M Holland;  Location: [facility];  Service: Gynecology;  Laterality: N/A;  . LEEP N/A 09/16/2013   Procedure: LOOP ELECTROSURGICAL EXCISION PROCEDURE (LEEP);  Surgeon: Meriel Picaichard M Holland, MD;  [facility] [place];  Service: Gynecology;  Laterality: N/A;  . NEGATIVE SLEEP STUDY  2014   per pt  . ORIF LEFT FOOT FX  2008   HARDWARE  REMOVED 6 MONTHS LATER  . [place] [facility] JULY 2018  . VULVECTOMY N/A 06/21/2012   Procedure: WIDE EXCISION VULVECTOMY;  Surgeon: Margarette Asal, MD;  Location: [facility];  Service: Gynecology;  Laterality: N/A;  excision of vulvar lesions,  need colposcope     OB History    Gravida  0   Para      Term      Preterm      AB      Living        SAB      TAB      Ectopic       Multiple      Live Births               Home Medications    Prior to Admission medications   Medication Sig Start Date End Date Taking? Authorizing Provider  amantadine (SYMMETREL) 100 MG capsule Take 100 mg by mouth 2 (two) times daily.   Yes [provider]  buPROPion (WELLBUTRIN XL) 150 MG 24 hr tablet Take 450 mg by mouth daily.    Yes [provider]  divalproex (DEPAKOTE ER) 500 MG 24 hr tablet Take 500 mg by mouth 2 (two) times daily.    Yes [provider]  DULoxetine (CYMBALTA) 60 MG capsule Take 60 mg by mouth 2 (two) times daily.    Yes [provider]  ibuprofen (ADVIL,MOTRIN) 200 MG tablet Take 400 mg by mouth every 6 (six) hours as needed for headache or moderate pain.   Yes [provider]  levothyroxine (SYNTHROID, LEVOTHROID) 125 MCG tablet Take 250 mcg by mouth daily before breakfast.    Yes [provider]  metFORMIN (GLUCOPHAGE) 500 MG tablet Take 1 tablet (500 mg total) by mouth 2 (two) times daily with a meal for 30 days. 04/21/18 09/09/18 Yes Little, Wenda Overland, MD  PERPHENAZINE PO Take by mouth.   Yes [provider]  Semaglutide,0.25 or 0.5MG /DOS, 2 MG/1.5ML SOPN Inject into the skin. 05/03/18  Yes [provider]  zolpidem (AMBIEN) 10 MG tablet Take 10 mg by mouth at bedtime.    Yes [provider]  albuterol (PROVENTIL HFA;VENTOLIN HFA) 108 (90 Base) MCG/ACT inhaler Inhale 1-2 puffs into the lungs every 6 (six) hours as needed for wheezing or shortness of breath. Patient not taking: Reported on 04/19/2018 12/11/17   Long, Wonda Olds, MD  doxycycline (VIBRAMYCIN) 100 MG capsule Take 100 mg by mouth 2 (two) times daily. 04/17/18   [provider]  hydrOXYzine (ATARAX/VISTARIL) 10 MG tablet Take 10 mg by mouth 2 (two) times daily.     [provider]  perphenazine (TRILAFON) 4 MG tablet Take 4 mg by mouth at bedtime.    [provider]    Family History Family  History  Problem Relation Age of Onset  . Cancer Mother   . Mental illness Mother   . Alcohol abuse Mother   . Drug abuse Mother   . Bipolar disorder Sister   . Alcohol abuse Maternal Aunt   . Drug abuse Maternal Aunt   . Depression Maternal Aunt   . Schizophrenia Maternal Aunt   . Alcohol abuse Maternal Uncle   . Alcohol abuse Maternal Grandfather   . Diabetes Father     Social History Social History   Tobacco Use  . Smoking status: Current  Every Day Smoker    Packs/day: 0.50    Years: 25.00    Pack years: 12.50    Types: Cigarettes  . Smokeless tobacco: Never Used  Substance Use Topics  . Alcohol use: No  . Drug use: No     Allergies   Codeine, Hydrocodone, Oxycodone, Trazodone and nefazodone, and Vicodin [hydrocodone-acetaminophen]   Review of Systems Review of Systems  Musculoskeletal: Positive for arthralgias and myalgias.  Skin: Negative for color change and wound.  Neurological: Negative for weakness and numbness.     Physical Exam Updated Vital Signs BP 107/72 (BP Location: Right Arm)   Pulse 68   Temp 98.7 F (37.1 C) (Oral)   Resp 18   Ht 5\' 4"  (1.626 m)   Wt (!) 149.7 kg   SpO2 100%   BMI 56.64 kg/m   Physical Exam Vitals signs and nursing note reviewed.  Constitutional:      General: She is not in acute distress.    Appearance: She is well-developed.  HENT:     Head: Normocephalic and atraumatic.  Neck:     Musculoskeletal: Neck supple.  Cardiovascular:     Rate and Rhythm: Normal rate and regular rhythm.     Heart sounds: Normal heart sounds. No murmur.  Pulmonary:     Effort: Pulmonary effort is normal. No respiratory distress.     Breath sounds: Normal breath sounds. No wheezing or rales.  Musculoskeletal:     Comments: Knee with diffuse peripatellar tenderness. Decreased ROM 2/2 pain.No abnormal alignment or patellar mobility. No bruising, erythema or warmth overlaying the joint.  Ligaments appear intact. 2+ DP pulses  bilaterally. All compartments are soft. Sensation intact distal to injury.  Skin:    General: Skin is warm and dry.  Neurological:     Mental Status: She is alert.      ED Treatments / Results  Labs (all labs ordered are listed, but only abnormal results are displayed) Labs Reviewed - No data to display  EKG None  Radiology Dg Knee Complete 4 Views Left  Result Date: 09/09/2018 CLINICAL DATA:  Acute left knee pain without known injury. EXAM: LEFT KNEE - COMPLETE 4+ VIEW COMPARISON:  Radiographs of January 14, 2015. FINDINGS: No evidence of fracture, dislocation, or joint effusion. No evidence of arthropathy or other focal bone abnormality. Soft tissues are unremarkable. IMPRESSION: Negative. Electronically Signed   By: Lupita RaiderJames  Green Jr M.D.   On: 09/09/2018 20:43    Procedures Procedures (including critical care time)  Medications Ordered in ED Medications  ketorolac (TORADOL) 30 MG/ML injection 30 mg (30 mg Intramuscular Given 09/09/18 2009)  acetaminophen (TYLENOL) tablet 1,000 mg (1,000 mg Oral Given 09/09/18 2009)  fentaNYL (SUBLIMAZE) injection 100 mcg (100 mcg Nasal Given 09/09/18 2122)     Initial Impression / Assessment and Plan / ED Course  I have reviewed the triage vital signs and the nursing notes.  Pertinent labs & imaging results that were available during my care of the patient were reviewed by me and considered in my medical decision making (see chart for details).       Lindsey Nicholson is a 48 y.o. female who presents to ED for left knee pain. NVI on exam. No concern for septic joint. X-ray negative. Has appointment with orthopedist on Tuesday. Encouraged to keep this appointment. Return precautions discussed and all questions answered.   Patient discussed with Dr. Jodi MourningZavitz who agrees with treatment plan.    Final Clinical Impressions(s) / ED Diagnoses  Final diagnoses:  Acute pain of left knee    ED Discharge Orders    None       Kerah Hardebeck, Chase PicketJaime  Pilcher, PA-C 09/09/18 2134    Blane OharaZavitz, Joshua, MD 09/09/18 2309

## 2018-09-09 NOTE — ED Notes (Signed)
Returned from XRAY

## 2018-09-09 NOTE — ED Triage Notes (Signed)
She woke with pain in her left knee. No injury. She was told by her MD the pain is arthritis.

## 2018-09-09 NOTE — Discharge Instructions (Signed)
It was my pleasure taking care of you today!   Keep your appointment with the orthopedist on Tuesday.   Return to ER for new or worsening symptoms, any additional concerns.

## 2019-01-04 ENCOUNTER — Encounter (HOSPITAL_BASED_OUTPATIENT_CLINIC_OR_DEPARTMENT_OTHER): Payer: Self-pay | Admitting: *Deleted

## 2019-01-04 ENCOUNTER — Other Ambulatory Visit: Payer: Self-pay

## 2019-01-04 ENCOUNTER — Emergency Department (HOSPITAL_BASED_OUTPATIENT_CLINIC_OR_DEPARTMENT_OTHER)
Admission: EM | Admit: 2019-01-04 | Discharge: 2019-01-04 | Disposition: A | Discharge status: Home / Self Care | Payer: Medicare Other | Attending: Emergency Medicine | Admitting: Emergency Medicine

## 2019-01-04 DIAGNOSIS — E039 Hypothyroidism, unspecified: Secondary | ICD-10-CM | POA: Diagnosis not present

## 2019-01-04 DIAGNOSIS — M25562 Pain in left knee: Secondary | ICD-10-CM | POA: Diagnosis present

## 2019-01-04 DIAGNOSIS — Z79899 Other long term (current) drug therapy: Secondary | ICD-10-CM | POA: Diagnosis not present

## 2019-01-04 DIAGNOSIS — F17210 Nicotine dependence, cigarettes, uncomplicated: Secondary | ICD-10-CM | POA: Diagnosis not present

## 2019-01-04 MED ORDER — LIDOCAINE 5 % EX PTCH
1.0000 | MEDICATED_PATCH | CUTANEOUS | Status: DC
  Filled 2019-01-04: qty 1

## 2019-01-04 MED ORDER — ACETAMINOPHEN 500 MG PO TABS: 500.0000 mg | ORAL_TABLET | ORAL | 0 refills | Status: DC | PRN

## 2019-01-04 MED ORDER — LIDOCAINE 4 % EX CREA
TOPICAL_CREAM | Freq: Three times a day (TID) | CUTANEOUS | Status: DC | PRN
  Administered 2019-01-04: 23:00:00 via TOPICAL

## 2019-01-04 MED ORDER — LIDOCAINE 5 % EX PTCH: 1.0000 | MEDICATED_PATCH | CUTANEOUS | 0 refills | Status: DC

## 2019-01-04 MED ORDER — LIDOCAINE 4 % EX CREA
TOPICAL_CREAM | CUTANEOUS | Status: AC
  Administered 2019-01-04: 23:00:00 via TOPICAL
  Filled 2019-01-04: qty 5

## 2019-01-04 NOTE — ED Triage Notes (Signed)
Pt reports left knee pain. Pt has been wheelchair bound for 12 weeks due to a 'knee strain'. States that she started walking several days ago and pain started back.

## 2019-01-04 NOTE — ED Notes (Addendum)
Pt states MRI of left knee 12 weeks ago, was told it was a bone strain by [employer]. Was wheelchair bound since, has tried to ambulate x2 days, now cannot walk due to extreme pain. 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

## 2019-01-04 NOTE — ED Provider Notes (Signed)
[facility] EMERGENCY DEPARTMENT Provider Note   CSN: [id number] Arrival date & time: 01/04/19  1942     History   Chief Complaint Chief Complaint  Patient presents with  . Knee Pain    HPI Lindsey Nicholson is a 48 y.o. female who presents to the ED for acute on chronic medial left knee pain.  She states that she was evaluated by Dr. Darnell Level MD with [facility] approximately 12 weeks ago for her pain was instructed to stay off her left leg entirely until symptoms resolve.  She states that she had been wheelchair-bound until approximately 1 week ago when she felt comfortable enough to attempt to walk again.  Immediately upon doing so, she felt the same pain discomfort that brought her to the ER when it first happened.  There has been no new trauma or symptoms.  She has an appointment scheduled with Dr. Christell Constant for tomorrow, but states that the pain was so significant tonight that she could not wait and needed to be evaluated in the ER.  Pain is worse with left knee flexion, ambulation, and palpation in medial meniscal area.  She has taken "a couple" Tylenol, but with no relief.  She states that she is getting a gastric sleeve soon and so her options for pain medication is limited.  She denies any fevers, chills, recent illness or infection, erythematous knee, new sexual contacts, history of gout, other affected joints, or other symptoms.     HPI  Past Medical History:  Diagnosis Date  . Anxiety   . Bipolar 1 disorder (HCC)   . Borderline personality disorder (HCC)   . Borderline personality disorder (HCC)   . Cervical dysplasia   . GERD (gastroesophageal reflux disease)   . History of vulvar dysplasia    vin I  --- s/p  wide excision 06-21-2012   . Hypothyroidism   . Moderate major depression (HCC)   . Morbid obesity (HCC)   . NAFL (nonalcoholic fatty liver)   . Pre-diabetes   . Premature ovarian failure   . Wears glasses     Patient Active Problem List   Diagnosis Date Noted  . Acute tear lateral meniscus, left, initial encounter 04/15/2017  . 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 Bipolar 1 disorder, depressed (HCC) 04/16/2012  . Depression, major, recurrent, moderate (HCC) 04/01/2012    Past Surgical History:  Procedure Laterality Date  . CARPAL TUNNEL RELEASE Bilateral LEFT  2013//  RIGHT  JAN  2015  . CHOLECYSTECTOMY  2011  . DILATATION & CURETTAGE/HYSTEROSCOPY WITH MYOSURE N/A 05/04/2018   Procedure: DILATATION & CURETTAGE/HYSTEROSCOPY WITH MYOSURE  POLYPECTOMY AND FAILED NOVASURE ABLATION;  Surgeon: Richarda Overlie, MD;  Location: [facility];  Service: Gynecology;  Laterality: N/A;  . HYSTEROSCOPY W/D&C  10/17/2010   Procedure: DILATATION AND CURETTAGE (D&C) /HYSTEROSCOPY;  Surgeon: Jessee Avers;  Location: [facility];  Service: Gynecology;  Laterality: N/A;  . HYSTEROSCOPY W/D&C N/A 06/21/2012   Procedure: DILATATION AND CURETTAGE /HYSTEROSCOPY;  Surgeon: [name], MD;  Location: [facility];  Service: Gynecology;  Laterality: N/A;  . KNEE ARTHROSCOPY WITH MEDIAL MENISECTOMY Left 04/15/2017   Procedure: Left knee arthroscopic partial medial, lateral and partial menisectomies with lateral release;  Surgeon: Yolonda Kida, MD;  Location: [facility];  Service: Orthopedics;  Laterality: Left;  . LAPAROSCOPIC TUBAL LIGATION  01/09/2011   Procedure: LAPAROSCOPIC TUBAL LIGATION;  Surgeon: [name];  Location: [facility];  Service:  Gynecology;  Laterality: Bilateral;  with Filshie clips  . LEEP  01/09/2011   Procedure: LOOP ELECTROSURGICAL EXCISION PROCEDURE (LEEP);  Surgeon: Margarette Asal;  Location: [facility];  Service: Gynecology;  Laterality: N/A;  . LEEP N/A 09/16/2013   Procedure: LOOP ELECTROSURGICAL EXCISION PROCEDURE (LEEP);  Surgeon: Margarette Asal, MD;  Location: [facility];  Service: Gynecology;  Laterality: N/A;  . NEGATIVE SLEEP STUDY  2014   per pt  . ORIF LEFT  FOOT FX  2008   HARDWARE  REMOVED 6 MONTHS LATER  . [place] [facility] JULY 2018  . VULVECTOMY N/A 06/21/2012   Procedure: WIDE EXCISION VULVECTOMY;  Surgeon: Margarette Asal, MD;  Location: [facility];  Service: Gynecology;  Laterality: N/A;  excision of vulvar lesions,  need colposcope     OB History    Gravida  0   Para      Term      Preterm      AB      Living        SAB      TAB      Ectopic      Multiple      Live Births               Home Medications    Prior to Admission medications   Medication Sig Start Date End Date Taking? Authorizing Provider  acetaminophen (TYLENOL) 500 MG tablet Take 1 tablet (500 mg total) by mouth every 4 (four) hours as needed for moderate pain. 01/04/19   Corena Herter, PA-C  albuterol (PROVENTIL HFA;VENTOLIN HFA) 108 (90 Base) MCG/ACT inhaler Inhale 1-2 puffs into the lungs every 6 (six) hours as needed for wheezing or shortness of breath. Patient not taking: Reported on 04/19/2018 12/11/17   Long, Wonda Olds, MD  amantadine (SYMMETREL) 100 MG capsule Take 100 mg by mouth 2 (two) times daily.    [provider]  buPROPion (WELLBUTRIN XL) 150 MG 24 hr tablet Take 450 mg by mouth daily.     [provider]  divalproex (DEPAKOTE ER) 500 MG 24 hr tablet Take 500 mg by mouth 2 (two) times daily.     [provider]  doxycycline (VIBRAMYCIN) 100 MG capsule Take 100 mg by mouth 2 (two) times daily. 04/17/18   [provider]  DULoxetine (CYMBALTA) 60 MG capsule Take 60 mg by mouth 2 (two) times daily.     [provider]  hydrOXYzine (ATARAX/VISTARIL) 10 MG tablet Take 10 mg by mouth 2 (two) times daily.     [provider]  ibuprofen (ADVIL,MOTRIN) 200 MG tablet Take 400 mg by mouth every 6 (six) hours as needed for headache or moderate pain.    [provider]  levothyroxine (SYNTHROID, LEVOTHROID) 125 MCG tablet Take 250 mcg by mouth daily before breakfast.      [provider]  lidocaine (LIDODERM) 5 % Place 1 patch onto the skin daily. Remove & Discard patch within 12 hours or as directed by MD 01/04/19   Corena Herter, PA-C  metFORMIN (GLUCOPHAGE) 500 MG tablet Take 1 tablet (500 mg total) by mouth 2 (two) times daily with a meal for 30 days. 04/21/18 09/09/18  Little, Wenda Overland, MD  perphenazine (TRILAFON) 4 MG tablet Take 4 mg by mouth at bedtime.    [provider]  PERPHENAZINE PO Take by mouth.    [provider]  Semaglutide,0.25 or 0.5MG /DOS, 2 MG/1.5ML  SOPN Inject into the skin. 05/03/18   [provider]  zolpidem (AMBIEN) 10 MG tablet Take 10 mg by mouth at bedtime.     [provider]    Family History Family History  Problem Relation Age of Onset  . Cancer Mother   . Mental illness Mother   . Alcohol abuse Mother   . Drug abuse Mother   . Bipolar disorder Sister   . Alcohol abuse Maternal Aunt   . Drug abuse Maternal Aunt   . Depression Maternal Aunt   . Schizophrenia Maternal Aunt   . Alcohol abuse Maternal Uncle   . Alcohol abuse Maternal Grandfather   . Diabetes Father     Social History Social History   Tobacco Use  . Smoking status: Current Every Day Smoker    Packs/day: 0.50    Years: 25.00    Pack years: 12.50    Types: Cigarettes  . Smokeless tobacco: Never Used  Substance Use Topics  . Alcohol use: No  . Drug use: No     Allergies   Codeine, Hydrocodone, Oxycodone, Trazodone and nefazodone, and Vicodin [hydrocodone-acetaminophen]   Review of Systems Review of Systems  All other systems reviewed and are negative.    Physical Exam Updated Vital Signs BP 117/80 (BP Location: Right Arm)   Pulse 76   Temp 98 F (36.7 C) (Oral)   Resp 17   Ht 5\' 4"  (1.626 m)   Wt (!) 141.5 kg   SpO2 98%   BMI 53.55 kg/m   Physical Exam Vitals signs and nursing note reviewed. Exam conducted with a chaperone present.  Constitutional:      Appearance: Normal  appearance.  HENT:     Head: Normocephalic and atraumatic.  Eyes:     General: No scleral icterus.    Conjunctiva/sclera: Conjunctivae normal.  Pulmonary:     Effort: Pulmonary effort is normal.  Musculoskeletal:     Comments: Significant pain with passive or active flexion of left knee.  Tender to palpation over medial meniscus.  No proximal tibial or distal quadricep tenderness to palpation.  Tenderness localized to medial aspect of knee.  No overlying erythema or discoloration.  No sensitivity to light touch.  Ankle and hip has normal range of motion, strength, and sensation.  Distal pulses and cap refill intact.  No unilateral leg or knee swelling.  Not particularly warm relative unaffected knee.  Skin:    General: Skin is dry.  Neurological:     Mental Status: She is alert.     GCS: GCS eye subscore is 4. GCS verbal subscore is 5. GCS motor subscore is 6.  Psychiatric:        Mood and Affect: Mood normal.        Behavior: Behavior normal.        Thought Content: Thought content normal.      ED Treatments / Results  Labs (all labs ordered are listed, but only abnormal results are displayed) Labs Reviewed - No data to display  EKG None  Radiology No results found.  Procedures Procedures (including critical care time)  Medications Ordered in ED Medications  lidocaine (LMX) 4 % cream ( Topical Given 01/04/19 2231)     Initial Impression / Assessment and Plan / ED Course  I have reviewed the triage vital signs and the nursing notes.  Pertinent labs & imaging results that were available during my care of the patient were reviewed by me and considered in my medical decision  making (see chart for details).        Patient has scheduled follow-up with Dr. Darnell Level, MD with [facility] tomorrow.  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  After taking Dr. Kathi Der advice and  staying off her leg for approximately 11 weeks, she felt as though she would be able to ambulate.  However, she immediately regretted her decision as the pain came back almost immediately.   She states that she cannot take NSAIDs or steroids as she is scheduled for a gastric sleeve in the next month or so.  She also reports anaphylactic allergic reaction to opiates.  She had taken Tylenol occasionally, but I will prescribe Tylenol Extra Strength 500 mg every 4 hours as needed for pain relief as well as Lidoderm patches for topical relief.  She understands that we are limited with what we can provide her for pain management given her upcoming gastric sleeve and allergies.  She also understands why imaging is not indicated at this point she is had no new traumas and no changes to her previously diagnosed knee pain.  There is no overlying erythema or swelling to suggest a clot or infective process.  Her point tenderness is very localized and I do not suspect gout.  Patient and her husband voiced understanding and are agreeable to plan.  Return to ED or seek medical attention should he develop any fevers or chills, significant swelling, erythema, warmth, or other new or worsening symptoms.  Below is the impression from her MRI obtained 12 weeks ago when seen by orthopedics. IMPRESSION:  1. Suspect horizontal tear of the body of the lateral meniscus.   2. Degenerative changes with questionable small tear of the posterior horn of the medial meniscus.  3. Tricompartment chondrosis, as above.  4. Nonspecific edema in the medial femoral condyle which may be degenerative/reactive. This may also relate to stress related change/bony contusion. No discrete fracture line.   5. Edema along the medial collateral ligament is favored to relate to reactive changes, less likely grade 1 sprain.  6. Moderate knee joint effusion with synovitis.  7. Moderate leaking Baker cyst.   Final Clinical  Impressions(s) / ED Diagnoses   Final diagnoses:  Left knee pain, unspecified chronicity    ED Discharge Orders         Ordered    acetaminophen (TYLENOL) 500 MG tablet  Every 4 hours PRN,   Status:  Discontinued     01/04/19 2244    lidocaine (LIDODERM) 5 %  Every 24 hours,   Status:  Discontinued     01/04/19 2244    acetaminophen (TYLENOL) 500 MG tablet  Every 4 hours PRN     01/04/19 2257    lidocaine (LIDODERM) 5 %  Every 24 hours     01/04/19 2257           Lorelee New, PA-C 01/04/19 2321    Linwood Dibbles, MD 01/05/19 1126

## 2019-01-04 NOTE — Discharge Instructions (Addendum)
Please follow-up with Dr. Jaynee Eagles for your scheduled orthopedic appointment tomorrow.  Please take the prescriptions as prescribed.  Return to ED or seek medical attention should he develop any fevers or chills, significant swelling, erythema, warmth, or other new or worsening symptoms.

## 2019-02-01 NOTE — Progress Notes (Signed)
Subjective:   Lindsey Nicholson was seen in consultation in the movement disorder clinic at the request of Melburn Popper, *.  The evaluation is for 2nd opinion of tremor.  I have reviewed records from the referring neurologist.  Most recent records were dated December 27, 2018.  Patient is a 48 year old female with a history of bipolar disorder and anxiety disorder who presents for evaluation of tremor.  Tremor started approximately 10+ years ago and involves the bilateral UE.  Tremor is most noticeable when she is stressed or upset.   There is no family hx of tremor.    Affected by caffeine:  No. (drinks very little caffeine) Affected by alcohol:  Doesn't drink EtOH Affected by stress:  Yes.   Affected by fatigue:  No. Spills soup if on spoon:  Yes.   Spills glass of liquid if full:  Yes.   and this is why she drinks out of thermos Affects ADL's (tying shoes, brushing teeth, etc):  No.  Current/Previously tried tremor medications: Primidone (unknown dosage per patient, but records indicate it was not helpful); propranolol, 10 mg (records stated that patient did not want to increase the dose further); topiramate 200 mg; amantadine 100 mg twice per day  Current medications that may exacerbate tremor:  Depakote, 500 mg twice per day - beeon 3-4 years; Reglan - been on that for less than a year; Cymbalta - been on for about 5 years; perphenazine (unsure of how long been on this).    Outside reports reviewed: lab reports, office notes and referral letter/letters.  Allergies  Allergen Reactions  . Codeine Anaphylaxis  . Hydrocodone Anaphylaxis  . Oxycodone Anaphylaxis and Other (See Comments)    Percocet  . Trazodone And Nefazodone Other (See Comments)    Blurry vision  . Vicodin [Hydrocodone-Acetaminophen] Anaphylaxis    Current Outpatient Medications  Medication Instructions  . acetaminophen (TYLENOL) 500 mg, Oral, Every 4 hours PRN  . amantadine (SYMMETREL) 100 mg, Oral, 2  times daily  . buPROPion (WELLBUTRIN XL) 450 mg, Oral, Daily  . divalproex (DEPAKOTE ER) 500 mg, Oral, 2 times daily  . DULoxetine (CYMBALTA) 60 mg, Oral, 2 times daily  . levothyroxine (SYNTHROID) 250 mcg, Oral, Daily before breakfast  . metFORMIN (GLUCOPHAGE) 500 mg, Oral, 2 times daily with meals  . metoCLOPramide (REGLAN) 10 mg, Oral, 2 times daily with meals  . pantoprazole (PROTONIX) 20 mg, Oral, Daily  . perphenazine (TRILAFON) 4 mg, Oral, Daily at bedtime  . Semaglutide,0.25 or 0.5MG /DOS, 2 MG/1.5ML SOPN Subcutaneous  . zolpidem (AMBIEN) 10 mg, Oral, Daily at bedtime    Past Medical History:  Diagnosis Date  . Anxiety   . Bipolar 1 disorder ([place])   . Borderline personality disorder ([place])   . Borderline personality disorder ([place])   . Cervical dysplasia   . GERD (gastroesophageal reflux disease)   . History of vulvar dysplasia    vin I  --- s/p  wide excision 06-21-2012   . Hypothyroidism   . Moderate major depression ([place])   . Morbid obesity ([place])   . NAFL (nonalcoholic fatty liver)   . Pre-diabetes   . Premature ovarian failure   . Wears glasses     Past Surgical History:  Procedure Laterality Date  . CARPAL TUNNEL RELEASE Bilateral LEFT  2013//  RIGHT  JAN  2015  . CHOLECYSTECTOMY  2011  . DILATATION & CURETTAGE/HYSTEROSCOPY WITH MYOSURE N/A 05/04/2018   Procedure: DILATATION & CURETTAGE/HYSTEROSCOPY WITH MYOSURE  POLYPECTOMY AND FAILED  NOVASURE ABLATION;  Surgeon: Richarda Overlie, MD;  [facility]: [facility];  Service: Gynecology;  Laterality: N/A;  . HYSTEROSCOPY W/D&C  10/17/2010   Procedure: DILATATION AND CURETTAGE (D&C) /HYSTEROSCOPY;  Surgeon: Jessee Avers;  [facility];  Service: Gynecology;  Laterality: N/A;  . HYSTEROSCOPY W/D&C N/A 06/21/2012   Procedure: DILATATION AND CURETTAGE /HYSTEROSCOPY;  Surgeon: [name], MD;  [facility];  Service: Gynecology;  Laterality: N/A;  . KNEE ARTHROSCOPY WITH MEDIAL MENISECTOMY Left 04/15/2017   Procedure:  Left knee arthroscopic partial medial, lateral and partial menisectomies with lateral release;  Surgeon: Yolonda Kida, MD;  [facility];  Service: Orthopedics;  Laterality: Left;  . LAPAROSCOPIC TUBAL LIGATION  01/09/2011   Procedure: LAPAROSCOPIC TUBAL LIGATION;  Surgeon: [name];  [facility];  Service: Gynecology;  Laterality: Bilateral;  with Filshie clips  . LEEP  01/09/2011   Procedure: LOOP ELECTROSURGICAL EXCISION PROCEDURE (LEEP);  Surgeon: [name];  [facility];  Service: Gynecology;  Laterality: N/A;  . LEEP N/A 09/16/2013   Procedure: LOOP ELECTROSURGICAL EXCISION PROCEDURE (LEEP);  Surgeon: [name], MD;  Location: [facility];  Service: Gynecology;  Laterality: N/A;  . NEGATIVE SLEEP STUDY  2014   per pt  . ORIF LEFT FOOT FX  2008   HARDWARE  REMOVED 6 MONTHS LATER  . STOMACH SURGERY  [place] [facility] JULY 2018  . VULVECTOMY N/A 06/21/2012   Procedure: WIDE EXCISION VULVECTOMY;  Surgeon: [name], MD;  [facility];  Service: Gynecology;  Laterality: N/A;  excision of vulvar lesions,  need colposcope    Social History   Socioeconomic History  . Marital status: Married    Spouse name: Not on file  . Number of children: Not on file  . Years of education: Not on file  . Highest education level: Not on file  Occupational History  . Not on file  Social Needs  . Financial resource strain: Not on file  . Food insecurity    Worry: Not on file    Inability: Not on file  . Transportation needs    Medical: Not on file    Non-medical: Not on file  Tobacco Use  . Smoking status: Current Every Day Smoker    Packs/day: 0.50    Years: 25.00    Pack years: 12.50    Types: Cigarettes  . Smokeless tobacco: Never Used  Substance and Sexual Activity  . Alcohol use: No  . Drug use: No  . Sexual activity: Not on file  Lifestyle  . Physical activity    Days per week: Not on file    Minutes per  session: Not on file  . Stress: Not on file  Relationships  . Social Musician on phone: Not on file    Gets together: Not on file    Attends religious service: Not on file    Active member of club or organization: Not on file    Attends meetings of clubs or organizations: Not on file    Relationship status: Not on file  . Intimate partner violence    Fear of current or ex partner: Not on file    Emotionally abused: Not on file    Physically abused: Not on file    Forced sexual activity: Not on file  Other Topics Concern  . Not on file  Social History Narrative  . Not on file    Family Status  Relation Name Status  . Mother  Alive  . Sister  Alive  . Mat [employer]  Alive  . Mat Schering-PloughUncle  Alive  . MGF  Alive  . Father  Alive  . Brother  Alive    Review of Systems Review of Systems  Constitutional: Negative.   HENT: Negative.   Eyes: Negative.   Respiratory: Negative.   Cardiovascular: Negative.   Gastrointestinal: Negative.   Genitourinary: Positive for frequency (but drinks lots of water).  Musculoskeletal: Negative.   Skin: Negative.   Psychiatric/Behavioral: Positive for depression (well controlled right now).      Objective:   VITALS:   Vitals:   02/03/19 1328  BP: 114/72  Pulse: 94  Resp: 20  Temp: 98.5 F (36.9 C)  SpO2: 96%  Weight: (!) 318 lb 3.2 oz (144.3 kg)  Height: 5\' 4"  (1.626 m)   Gen:  Appears stated age and in NAD. HEENT:  Normocephalic, atraumatic. The mucous membranes are moist. The superficial temporal arteries are without ropiness or tenderness. Cardiovascular: Regular rate and rhythm. Lungs: Clear to auscultation bilaterally. Neck: There are no carotid bruits noted bilaterally.  NEUROLOGICAL:  Orientation:  The patient is alert and oriented x 3.  Recent and remote memory are intact.  Attention span and concentration are normal.  Able to name objects and repeat without trouble.  Fund of knowledge is appropriate Cranial nerves:  There is good facial symmetry.  Extraocular muscles are intact and visual fields are full to confrontational testing. Speech is fluent and clear. Soft palate rises symmetrically and there is no tongue deviation. Hearing is intact to conversational tone. Tone: Tone is good throughout. Sensation: Sensation is intact to light touch and pinprick throughout (facial, trunk, extremities). Vibration is intact at the bilateral big toe. There is no extinction with double simultaneous stimulation. There is no sensory dermatomal level identified. Coordination:  The patient has no dysdiadichokinesia or dysmetria. Motor: Strength is 5/5 in the bilateral upper and lower extremities.  Shoulder shrug is equal bilaterally.  There is no pronator drift.  There are no fasciculations noted. DTR's: Deep tendon reflexes are -4 at the bilateral biceps, triceps, brachioradialis, patella and achilles.  Plantar responses are downgoing bilaterally. Gait and Station: The patient has a slightly antalgic gait, but it is steady.  MOVEMENT EXAM: Tremor:  There is no rest tremor.  There is postural tremor.  It is not significantly worse with intention.  It is not significantly worse with the weight.  She has a lot of trouble with Archimedes spirals  Labs: On January 21, 2019, the patient's TSH was 1.392. On Aug 04, 2017, sodium was 140, potassium 4.4, chloride 100, CO2 24, BUN 10, creatinine 0.93, glucose 106, AST 45, ALT 60     Assessment/Plan:   1.  Tremor  -Explained to the patient that tremor is likely due to medications.  She is on multiple medications for bipolar d/o that induce tremor.  Suspect that the primary 1 is Depakote.  Despite best to that this does not mean that she is toxic on Depakote, but it is a common source of tremor.  Discussed with the patient that she should not go off or alter her medication in any way, but perhaps she can talk to her psychiatrist, Dr. Evelene CroonKaur about the medications.  Her perphenazine can  certainly induce tremor as well and we discussed this, but oftentimes this is more resting tremor.  Discussed with the patient that many times, if tremor is induced by other  medications, it is very resistant to treatment.  This has been her experience.  As long as she is on other tremor inducing medications, I really would not recommend adding medication to try and treat the tremor.  If she is able to safely get off of medications that induce tremor, then perhaps something like trihexyphenidyl could be of value.  She also did not know the dose of primidone that she tried in the past, so pushing up this dose could be of value.  She will be following up with her psychiatrist as well as her referring neurologist.  In the meantime, I did tell her that using weighted gloves or weighted spoons/forks could be of value.  I gave her information to the readi steadi glove.  She will follow-up with me on an as-needed basis.  CC:  Coralee Rud, PA-C

## 2019-02-03 ENCOUNTER — Encounter: Payer: Self-pay | Admitting: Neurology

## 2019-02-03 ENCOUNTER — Other Ambulatory Visit: Payer: Self-pay

## 2019-02-03 ENCOUNTER — Ambulatory Visit: Payer: Medicare Other | Admitting: Neurology

## 2019-02-03 VITALS — BP 114/72 | HR 94 | Temp 98.5°F | Resp 20 | Ht 64.0 in | Wt 318.2 lb

## 2019-02-03 DIAGNOSIS — G251 Drug-induced tremor: Secondary | ICD-10-CM | POA: Diagnosis not present

## 2019-02-03 DIAGNOSIS — F319 Bipolar disorder, unspecified: Secondary | ICD-10-CM

## 2019-02-03 NOTE — Progress Notes (Signed)
Done

## 2019-02-03 NOTE — Patient Instructions (Signed)
I will send a copy of my notes to Dr. Toy Care.  You can follow up with Melburn Popper, *.  It was good to see you today!  The physicians and staff at [facility] are committed to providing excellent care. You may receive a survey requesting feedback about your experience at our office. We strive to receive "very good" responses to the survey questions. If you feel that your experience would prevent you from giving the office a "very good " response, please contact our office to try to remedy the situation. We may be reached at [phone]. Thank you for taking the time out of your busy day to complete the survey.

## 2019-04-06 ENCOUNTER — Emergency Department (HOSPITAL_BASED_OUTPATIENT_CLINIC_OR_DEPARTMENT_OTHER)
Admission: EM | Admit: 2019-04-06 | Discharge: 2019-04-06 | Discharge status: Against Medical Advice | Payer: Medicare Other | Attending: Emergency Medicine | Admitting: Emergency Medicine

## 2019-04-06 ENCOUNTER — Encounter (HOSPITAL_BASED_OUTPATIENT_CLINIC_OR_DEPARTMENT_OTHER): Payer: Self-pay

## 2019-04-06 ENCOUNTER — Other Ambulatory Visit: Payer: Self-pay

## 2019-04-06 DIAGNOSIS — R739 Hyperglycemia, unspecified: Secondary | ICD-10-CM | POA: Diagnosis present

## 2019-04-06 DIAGNOSIS — R519 Headache, unspecified: Secondary | ICD-10-CM | POA: Diagnosis not present

## 2019-04-06 DIAGNOSIS — Z5321 Procedure and treatment not carried out due to patient leaving prior to being seen by health care provider: Secondary | ICD-10-CM | POA: Insufficient documentation

## 2019-04-06 LAB — CBG MONITORING, ED: Glucose-Capillary: 269 mg/dL — ABNORMAL HIGH (ref 70–99)

## 2019-04-06 NOTE — ED Triage Notes (Addendum)
Pt c/o elevated BS x "weeks", HA, feeling shaky-states she was seen by PCP today-reports no plan-reports last BS 322 at ~9pm-NAD-steady gait

## 2019-04-06 NOTE — ED Notes (Signed)
After CBG results pt states she is leaving-NAD-steady gait

## 2019-04-15 ENCOUNTER — Other Ambulatory Visit: Payer: Self-pay

## 2019-04-15 ENCOUNTER — Encounter (HOSPITAL_BASED_OUTPATIENT_CLINIC_OR_DEPARTMENT_OTHER): Payer: Self-pay

## 2019-04-15 ENCOUNTER — Emergency Department (HOSPITAL_BASED_OUTPATIENT_CLINIC_OR_DEPARTMENT_OTHER)
Admission: EM | Admit: 2019-04-15 | Discharge: 2019-04-15 | Disposition: A | Discharge status: Home / Self Care | Payer: Medicare Other | Attending: Emergency Medicine | Admitting: Emergency Medicine

## 2019-04-15 DIAGNOSIS — R21 Rash and other nonspecific skin eruption: Secondary | ICD-10-CM | POA: Diagnosis not present

## 2019-04-15 DIAGNOSIS — Z5321 Procedure and treatment not carried out due to patient leaving prior to being seen by health care provider: Secondary | ICD-10-CM | POA: Insufficient documentation

## 2019-04-15 DIAGNOSIS — E1165 Type 2 diabetes mellitus with hyperglycemia: Secondary | ICD-10-CM | POA: Insufficient documentation

## 2019-04-15 LAB — CBG MONITORING, ED: Glucose-Capillary: 255 mg/dL — ABNORMAL HIGH (ref 70–99)

## 2019-04-15 NOTE — ED Triage Notes (Addendum)
Pt c/o elevated BS x 1 week-also c/o scattered rash x 1.5 weeks-states she was seen by PCP and received steriod inj-seen by endocrinologist this week with med change-NAD-to triage in w/c

## 2019-04-15 NOTE — ED Notes (Signed)
After CBG results pt states she is leaving-NAD

## 2019-10-18 ENCOUNTER — Ambulatory Visit: Payer: Self-pay | Admitting: Allergy and Immunology

## 2019-12-30 ENCOUNTER — Other Ambulatory Visit: Payer: Self-pay

## 2019-12-30 ENCOUNTER — Emergency Department (HOSPITAL_BASED_OUTPATIENT_CLINIC_OR_DEPARTMENT_OTHER)
Admission: EM | Admit: 2019-12-30 | Discharge: 2019-12-30 | Disposition: A | Discharge status: Home / Self Care | Payer: Medicare Other | Attending: Emergency Medicine | Admitting: Emergency Medicine

## 2019-12-30 ENCOUNTER — Encounter (HOSPITAL_BASED_OUTPATIENT_CLINIC_OR_DEPARTMENT_OTHER): Payer: Self-pay | Admitting: Emergency Medicine

## 2019-12-30 ENCOUNTER — Emergency Department (HOSPITAL_BASED_OUTPATIENT_CLINIC_OR_DEPARTMENT_OTHER): Payer: Medicare Other

## 2019-12-30 DIAGNOSIS — R310 Gross hematuria: Secondary | ICD-10-CM

## 2019-12-30 DIAGNOSIS — N3001 Acute cystitis with hematuria: Secondary | ICD-10-CM

## 2019-12-30 DIAGNOSIS — F17210 Nicotine dependence, cigarettes, uncomplicated: Secondary | ICD-10-CM | POA: Diagnosis not present

## 2019-12-30 DIAGNOSIS — Z79899 Other long term (current) drug therapy: Secondary | ICD-10-CM | POA: Insufficient documentation

## 2019-12-30 DIAGNOSIS — E039 Hypothyroidism, unspecified: Secondary | ICD-10-CM | POA: Insufficient documentation

## 2019-12-30 DIAGNOSIS — R319 Hematuria, unspecified: Secondary | ICD-10-CM | POA: Diagnosis present

## 2019-12-30 LAB — URINALYSIS, ROUTINE W REFLEX MICROSCOPIC
Glucose, UA: NEGATIVE mg/dL
Ketones, ur: NEGATIVE mg/dL
Leukocytes,Ua: NEGATIVE
Nitrite: NEGATIVE
Protein, ur: NEGATIVE mg/dL
Specific Gravity, Urine: >1.03 — ABNORMAL HIGH (ref 1.005–1.030)
pH: 6 (ref 5.0–8.0)

## 2019-12-30 LAB — URINALYSIS, MICROSCOPIC (REFLEX)

## 2019-12-30 LAB — PREGNANCY, URINE: Preg Test, Ur: NEGATIVE

## 2019-12-30 MED ORDER — CEPHALEXIN 500 MG PO CAPS: 500.0000 mg | ORAL_CAPSULE | Freq: Two times a day (BID) | ORAL | 0 refills | Status: AC

## 2019-12-30 NOTE — ED Triage Notes (Signed)
Patient presents with vaginal bleeding onset this am. States left lower abd pain cramping x 2 days.

## 2019-12-30 NOTE — ED Provider Notes (Signed)
[facility] EMERGENCY DEPARTMENT Provider Note   CSN: [id number] Arrival date & time: 12/30/19  6759     History Chief Complaint  Patient presents with  . Vaginal Bleeding    Lindsey Nicholson is a 49 y.o. female.  Patient is a 49 year old female with a history of bipolar disorder, GERD, prediabetes who presents with hematuria.  She said that she has had 2 episodes of bright red blood in her urine.  No clots.  She said the last time she urinated this morning however there was no blood.  She is confident that it was in her urine.  She does not report any vaginal bleeding.  She said she did use a Q-tip and swab her vaginal area and there was no blood.  She is also having some pain in her left lower abdomen that is been intermittent for the last 2 days.  No associated nausea or vomiting.  No fevers.  No vaginal discharge.  No back pain.  She did have a history of 1 kidney stone in the past but none recently.        Past Medical History:  Diagnosis Date  . Anxiety   . Bipolar 1 disorder (HCC)   . Borderline personality disorder (HCC)   . Borderline personality disorder (HCC)   . Cervical dysplasia   . GERD (gastroesophageal reflux disease)   . History of vulvar dysplasia    vin I  --- s/p  wide excision 06-21-2012   . Hypothyroidism   . Moderate major depression (HCC)   . Morbid obesity (HCC)   . NAFL (nonalcoholic fatty liver)   . Pre-diabetes   . Premature ovarian failure   . Wears glasses     Patient Active Problem List   Diagnosis Date Noted  . Acute tear lateral meniscus, left, initial encounter 04/15/2017  . 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 Bipolar 1 disorder, depressed (HCC) 04/16/2012  . Depression, major, recurrent, moderate (HCC) 04/01/2012    Past Surgical History:  Procedure Laterality Date  . CARPAL TUNNEL RELEASE Bilateral LEFT  2013//  RIGHT  JAN  2015  . CHOLECYSTECTOMY  2011  . DILATATION &  CURETTAGE/HYSTEROSCOPY WITH MYOSURE N/A 05/04/2018   Procedure: DILATATION & CURETTAGE/HYSTEROSCOPY WITH MYOSURE  POLYPECTOMY AND FAILED NOVASURE ABLATION;  Surgeon: Richarda Overlie, MD;  Location: [facility];  Service: Gynecology;  Laterality: N/A;  . HYSTEROSCOPY WITH D & C  10/17/2010   Procedure: DILATATION AND CURETTAGE (D&C) /HYSTEROSCOPY;  Surgeon: Jessee Avers;  Location: [facility];  Service: Gynecology;  Laterality: N/A;  . HYSTEROSCOPY WITH D & C N/A 06/21/2012   Procedure: DILATATION AND CURETTAGE /HYSTEROSCOPY;  Surgeon: [name], MD;  Location: [facility];  Service: Gynecology;  Laterality: N/A;  . KNEE ARTHROSCOPY WITH MEDIAL MENISECTOMY Left 04/15/2017   Procedure: Left knee arthroscopic partial medial, lateral and partial menisectomies with lateral release;  Surgeon: Yolonda Kida, MD;  Location: [facility];  Service: Orthopedics;  Laterality: Left;  . LAPAROSCOPIC TUBAL LIGATION  01/09/2011   Procedure: LAPAROSCOPIC TUBAL LIGATION;  Surgeon: [name];  Location: [facility];  Service: Gynecology;  Laterality: Bilateral;  with [place] clips  . LEEP  01/09/2011   Procedure: LOOP ELECTROSURGICAL EXCISION PROCEDURE (LEEP);  Surgeon: [name];  Location: [facility];  Service: Gynecology;  Laterality: N/A;  . LEEP N/A 09/16/2013   Procedure: LOOP ELECTROSURGICAL EXCISION PROCEDURE (LEEP);  Surgeon: [name], MD;  Location: [facility];  Service: Gynecology;  Laterality: N/A;  . NEGATIVE SLEEP STUDY  2014   per pt  . ORIF LEFT FOOT FX  2008   HARDWARE  REMOVED 6 MONTHS LATER  . STOMACH SURGERY  [facility]ERA GASTRIC [facility] JULY 2018  . VULVECTOMY N/A 06/21/2012   Procedure: WIDE EXCISION VULVECTOMY;  Surgeon: [name], MD;  Location: [facility];  Service: Gynecology;  Laterality: N/A;  excision of vulvar lesions,  need colposcope     OB History    Gravida  0   Para      Term      Preterm      AB      Living        SAB      TAB      Ectopic        Multiple      Live Births              Family History  Problem Relation Age of Onset  . Cancer Mother   . Mental illness Mother   . Alcohol abuse Mother   . Drug abuse Mother   . Bipolar disorder Sister   . Alcohol abuse Maternal Aunt   . Drug abuse Maternal Aunt   . Depression Maternal Aunt   . Schizophrenia Maternal Aunt   . Alcohol abuse Maternal Uncle   . Alcohol abuse Maternal Grandfather   . Diabetes Father     Social History   Tobacco Use  . Smoking status: Current Every Day Smoker    Packs/day: 0.50    Years: 25.00    Pack years: 12.50    Types: Cigarettes  . Smokeless tobacco: Never Used  Vaping Use  . Vaping Use: Never used  Substance Use Topics  . Alcohol use: No  . Drug use: No    Home Medications Prior to Admission medications   Medication Sig Start Date End Date Taking? Authorizing Provider  acetaminophen (TYLENOL) 500 MG tablet Take 1 tablet (500 mg total) by mouth every 4 (four) hours as needed for moderate pain. 01/04/19   Lorelee New, PA-C  amantadine (SYMMETREL) 100 MG capsule Take 100 mg by mouth 2 (two) times daily.    [provider]  buPROPion (WELLBUTRIN XL) 150 MG 24 hr tablet Take 450 mg by mouth daily.     [provider]  cephALEXin (KEFLEX) 500 MG capsule Take 1 capsule (500 mg total) by mouth 2 (two) times daily for 7 days. 12/30/19 01/06/20  Rolan Bucco, MD  cetirizine (ZYRTEC) 10 MG tablet Take 10 mg by mouth daily. 09/13/19   [provider]  diazepam (VALIUM) 5 MG tablet SMARTSIG:1-2 Tablet(s) By Mouth PRN 11/30/19   [provider]  divalproex (DEPAKOTE ER) 500 MG 24 hr tablet Take 500 mg by mouth 2 (two) times daily.     [provider]  DULoxetine (CYMBALTA) 60 MG capsule Take 60 mg by mouth 2 (two) times daily.     [provider]  levothyroxine (SYNTHROID, LEVOTHROID) 125 MCG tablet Take 250 mcg by mouth daily before breakfast.     [provider]   metFORMIN (GLUCOPHAGE) 500 MG tablet Take 1 tablet (500 mg total) by mouth 2 (two) times daily with a meal for 30 days. 04/21/18 02/03/19  Little, Ambrose Finland, MD  metoCLOPramide (REGLAN) 10 MG tablet Take 10 mg by mouth 2 (two) times daily with a meal.    [provider]  montelukast (SINGULAIR)  10 MG tablet Take 10 mg by mouth daily. 10/25/19   [provider]  pantoprazole (PROTONIX) 20 MG tablet Take 20 mg by mouth daily.    [provider]  perphenazine (TRILAFON) 4 MG tablet Take 4 mg by mouth at bedtime.    [provider]  Semaglutide,0.25 or 0.5MG /DOS, 2 MG/1.5ML SOPN Inject into the skin. 05/03/18   [provider]  zolpidem (AMBIEN) 10 MG tablet Take 10 mg by mouth at bedtime.     [provider]    Allergies    Codeine, Hydrocodone, Oxycodone, Trazodone and nefazodone, and Vicodin [hydrocodone-acetaminophen]  Review of Systems   Review of Systems  Constitutional: Negative for chills, diaphoresis, fatigue and fever.  HENT: Negative for congestion, rhinorrhea and sneezing.   Eyes: Negative.   Respiratory: Negative for cough, chest tightness and shortness of breath.   Cardiovascular: Negative for chest pain and leg swelling.  Gastrointestinal: Positive for abdominal pain. Negative for blood in stool, diarrhea, nausea and vomiting.  Genitourinary: Positive for hematuria. Negative for difficulty urinating, flank pain and frequency.  Musculoskeletal: Negative for arthralgias and back pain.  Skin: Negative for rash.  Neurological: Negative for dizziness, speech difficulty, weakness, numbness and headaches.    Physical Exam Updated Vital Signs BP 109/75 (BP Location: Right Arm)   Pulse 91   Temp 98.6 F (37 C) (Oral)   Resp 18   Ht 5\' 4"  (1.626 m)   Wt (!) 143 kg   SpO2 98%   BMI 54.11 kg/m   Physical Exam Constitutional:      Appearance: She is well-developed.  HENT:     Head: Normocephalic and atraumatic.  Eyes:      Pupils: Pupils are equal, round, and reactive to light.  Cardiovascular:     Rate and Rhythm: Normal rate and regular rhythm.     Heart sounds: Normal heart sounds.  Pulmonary:     Effort: Pulmonary effort is normal. No respiratory distress.     Breath sounds: Normal breath sounds. No wheezing or rales.  Chest:     Chest wall: No tenderness.  Abdominal:     General: Bowel sounds are normal.     Palpations: Abdomen is soft.     Tenderness: There is no abdominal tenderness. There is no guarding or rebound.  Musculoskeletal:        General: Normal range of motion.     Cervical back: Normal range of motion and neck supple.  Lymphadenopathy:     Cervical: No cervical adenopathy.  Skin:    General: Skin is warm and dry.     Findings: No rash.  Neurological:     Mental Status: She is alert and oriented to person, place, and time.     ED Results / Procedures / Treatments   Labs (all labs ordered are listed, but only abnormal results are displayed) Labs Reviewed  URINALYSIS, ROUTINE W REFLEX MICROSCOPIC - Abnormal; Notable for the following components:      Result Value   Specific Gravity, Urine >1.030 (*)    Hgb urine dipstick MODERATE (*)    Bilirubin Urine SMALL (*)    All other components within normal limits  URINALYSIS, MICROSCOPIC (REFLEX) - Abnormal; Notable for the following components:   Bacteria, UA MANY (*)    All other components within normal limits  URINE CULTURE  PREGNANCY, URINE    EKG None  Radiology CT Renal Stone Study  Result Date: 12/30/2019 CLINICAL DATA:  Left flank pain EXAM:  CT ABDOMEN AND PELVIS WITHOUT CONTRAST TECHNIQUE: Multidetector CT imaging of the abdomen and pelvis was performed following the standard protocol without IV contrast. COMPARISON:  10/14/2011 FINDINGS: Lower chest: No acute abnormality. Hepatobiliary: Mildly decreased attenuation of the hepatic parenchyma. No focal liver lesion identified on nonenhanced study. Prior  cholecystectomy. No biliary dilatation. Pancreas: Unremarkable. No pancreatic ductal dilatation or surrounding inflammatory changes. Spleen: Normal in size without focal abnormality. Adrenals/Urinary Tract: Unremarkable adrenal glands. Bilateral kidneys have a normal noncontrast appearance. No renal stone or hydronephrosis. Ureters are unremarkable. No ureteral calculi. Urinary bladder is largely decompressed, limiting its evaluation. Stomach/Bowel: Stomach is within normal limits. Appendix appears normal (series 2, image 69). No evidence of bowel wall thickening, distention, or inflammatory changes. Vascular/Lymphatic: No significant vascular findings are present. No enlarged abdominal or pelvic lymph nodes. Reproductive: Unremarkable uterus. Bilateral tubal ligation clips. No adnexal masses. Other: No free fluid. No abdominopelvic fluid collection. No pneumoperitoneum. No abdominal wall hernia. Musculoskeletal: No acute or significant osseous findings. IMPRESSION: 1. No acute abdominopelvic findings. Specifically, no evidence of obstructive uropathy. 2. Hepatic steatosis. Electronically Signed   By: Duanne Guess D.O.   On: 12/30/2019 08:18    Procedures Procedures (including critical care time)  Medications Ordered in ED Medications - No data to display  ED Course  I have reviewed the triage vital signs and the nursing notes.  Pertinent labs & imaging results that were available during my care of the patient were reviewed by me and considered in my medical decision making (see chart for details).    MDM Rules/Calculators/A&P                          Patient presents with gross hematuria.  She has some mild left lower abdominal pain.  Given this, CT scan was performed to assess for kidney stone.  This was negative for kidney stone or other acute abnormality.  Her urine showed bacteria and was positive for a hemoglobin but negative for RBCs.  Given this, a urine culture was sent and I will go  ahead and start her on Keflex.  I encouraged her to follow-up with her PCP to have them recheck her urine after symptoms have cleared.  Return precautions were given. Final Clinical Impression(s) / ED Diagnoses Final diagnoses:  Gross hematuria  Acute cystitis with hematuria    Rx / DC Orders ED Discharge Orders         Ordered    cephALEXin (KEFLEX) 500 MG capsule  2 times daily        12/30/19 0845           Rolan Bucco, MD 12/30/19 [phone]

## 2019-12-31 LAB — URINE CULTURE: Culture: 10000 — AB

## 2020-01-01 ENCOUNTER — Telehealth: Payer: Self-pay | Admitting: Emergency Medicine

## 2020-01-01 NOTE — Telephone Encounter (Signed)
Post ED Visit - Positive Culture Follow-up  Culture report reviewed by antimicrobial stewardship pharmacist: [facility] []  , Pharm.D. []  Enzo Bi, Pharm.D., BCPS AQ-ID []  , Pharm.D., BCPS []  Celedonio Miyamoto, Pharm.D., BCPS []  [place], Garvin Fila.D., BCPS, AAHIVP []  , Pharm.D., BCPS, AAHIVP []  Georgina Pillion, PharmD, BCPS []  , PharmD, BCPS []  [place], PharmD, BCPS []  [place], PharmD []  , PharmD, BCPS [x]  Estella Husk, PharmD  Pharmacy Team []  Lysle Pearl, PharmD []  , PharmD []  Phillips Climes, PharmD []  , Rph []  Agapito Games) , PharmD []  Verlan Friends, PharmD []  , PharmD []  Mervyn Gay, PharmD []  , PharmD []  Vinnie Level, PharmD []  [facility], PharmD []  , PharmD []  Len Childs, PharmD   Positive urine culture Treated with Cephalexin, organism sensitive to the same and no further patient follow-up is required at this time.  Lindsey Nicholson 01/01/2020, 4:29 PM

## 2020-06-18 HISTORY — PX: LAPAROSCOPIC GASTRIC SLEEVE RESECTION: SHX5895

## 2021-01-03 ENCOUNTER — Other Ambulatory Visit: Payer: Self-pay

## 2021-01-03 ENCOUNTER — Emergency Department (HOSPITAL_BASED_OUTPATIENT_CLINIC_OR_DEPARTMENT_OTHER): Payer: Medicare Other

## 2021-01-03 ENCOUNTER — Encounter (HOSPITAL_BASED_OUTPATIENT_CLINIC_OR_DEPARTMENT_OTHER): Payer: Self-pay | Admitting: Emergency Medicine

## 2021-01-03 ENCOUNTER — Emergency Department (HOSPITAL_BASED_OUTPATIENT_CLINIC_OR_DEPARTMENT_OTHER)
Admission: EM | Admit: 2021-01-03 | Discharge: 2021-01-04 | Disposition: A | Discharge status: Home / Self Care | Payer: Medicare Other | Attending: Emergency Medicine | Admitting: Emergency Medicine

## 2021-01-03 DIAGNOSIS — F17210 Nicotine dependence, cigarettes, uncomplicated: Secondary | ICD-10-CM | POA: Diagnosis not present

## 2021-01-03 DIAGNOSIS — Z794 Long term (current) use of insulin: Secondary | ICD-10-CM | POA: Diagnosis not present

## 2021-01-03 DIAGNOSIS — Z79899 Other long term (current) drug therapy: Secondary | ICD-10-CM | POA: Diagnosis not present

## 2021-01-03 DIAGNOSIS — M25551 Pain in right hip: Secondary | ICD-10-CM | POA: Diagnosis present

## 2021-01-03 DIAGNOSIS — M5431 Sciatica, right side: Secondary | ICD-10-CM | POA: Insufficient documentation

## 2021-01-03 DIAGNOSIS — E039 Hypothyroidism, unspecified: Secondary | ICD-10-CM | POA: Insufficient documentation

## 2021-01-03 LAB — URINALYSIS, ROUTINE W REFLEX MICROSCOPIC
Bilirubin Urine: NEGATIVE
Glucose, UA: NEGATIVE mg/dL
Hgb urine dipstick: NEGATIVE
Ketones, ur: NEGATIVE mg/dL
Leukocytes,Ua: NEGATIVE
Nitrite: NEGATIVE
Protein, ur: NEGATIVE mg/dL
Specific Gravity, Urine: 1.02 (ref 1.005–1.030)
pH: 6 (ref 5.0–8.0)

## 2021-01-03 LAB — PREGNANCY, URINE: Preg Test, Ur: NEGATIVE

## 2021-01-03 MED ORDER — KETOROLAC TROMETHAMINE 60 MG/2ML IM SOLN
60.0000 mg | Freq: Once | INTRAMUSCULAR | Status: AC
  Administered 2021-01-03: 60 mg via INTRAMUSCULAR
  Filled 2021-01-03: qty 2

## 2021-01-03 MED ORDER — LIDOCAINE 5 % EX PTCH
1.0000 | MEDICATED_PATCH | CUTANEOUS | Status: DC
  Administered 2021-01-03: 1 via TRANSDERMAL
  Filled 2021-01-03: qty 1

## 2021-01-03 NOTE — ED Notes (Signed)
Pharmacy updated with patient 

## 2021-01-03 NOTE — ED Provider Notes (Signed)
[facility] EMERGENCY DEPARTMENT Provider Note   CSN: [id number] Arrival date & time: 01/03/21  2021     History Chief Complaint  Patient presents with   Back Pain    Lindsey Nicholson is a 50 y.o. female.   Back Pain Associated symptoms: no abdominal pain, no chest pain, no dysuria, no fever, no numbness, no pelvic pain and no weakness   Patient presents for persistent and worsening pain located in the posterior right aspect of her hip and radiating down her lateral right proximal leg.  She does have a history of chronic back pain and sciatica.  She reports that, in the past, her exacerbations of back pain have been relieved by Toradol.  Earlier this week, she did get a Toradol shot without relief.  She has been taking her home Valium and Flexeril, also without relief.  She went to the chiropractor for adjustments but did not experience any relief of her symptoms.  Patient presents to the ED due to the severity and persistence of her pain.  Pain is progressed to the point where she has very limited mobility.  She has to be helped by her husband for any movements.  Patient denies any numbness or changes in bladder or bowel function.  She denies any systemic symptoms.  She denies any history of surgery on her back.  She does state that she had a pelvic fracture as a child.  She denies any new injuries that precipitated her worsened symptoms over the past week.    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   Bipolar 1 disorder, depressed (HCC) 04/16/2012   Depression, major, recurrent, moderate (HCC) 04/01/2012    Past Surgical History:  Procedure Laterality Date   CARPAL TUNNEL RELEASE Bilateral LEFT  2013//  RIGHT  JAN  2015   CHOLECYSTECTOMY  2011   DILATATION & CURETTAGE/HYSTEROSCOPY WITH MYOSURE N/A 05/04/2018   Procedure: DILATATION & CURETTAGE/HYSTEROSCOPY WITH MYOSURE  POLYPECTOMY AND FAILED NOVASURE ABLATION;  Surgeon: Richarda Overlie, MD;  Location: [facility];  Service: Gynecology;  Laterality: N/A;   HYSTEROSCOPY WITH D & C  10/17/2010   Procedure: DILATATION AND CURETTAGE (D&C) /HYSTEROSCOPY;  Surgeon: Jessee Avers;  Location: [facility];  Service: Gynecology;  Laterality: N/A;   HYSTEROSCOPY WITH D & C N/A 06/21/2012   Procedure: DILATATION AND CURETTAGE /HYSTEROSCOPY;  Surgeon: [name], MD;  Location: [facility];  Service: Gynecology;  Laterality: N/A;   KNEE ARTHROSCOPY WITH MEDIAL MENISECTOMY Left 04/15/2017   Procedure: Left knee arthroscopic partial medial, lateral and partial menisectomies with lateral release;  Surgeon: Yolonda Kida, MD;  Location: [facility];  Service: Orthopedics;  Laterality: Left;   LAPAROSCOPIC GASTRIC BAND REMOVAL WITH LAPAROSCOPIC GASTRIC SLEEVE RESECTION N/A  LAPAROSCOPIC TUBAL LIGATION  01/09/2011   Procedure: LAPAROSCOPIC TUBAL LIGATION;  Surgeon: [name];  Location: [facility];  Service: Gynecology;  Laterality: Bilateral;  with [place] clips   LEEP  01/09/2011   Procedure: LOOP ELECTROSURGICAL EXCISION PROCEDURE (LEEP);  Surgeon: [name];  Location: [facility];  Service: Gynecology;  Laterality: N/A;   LEEP N/A 09/16/2013   Procedure: LOOP ELECTROSURGICAL EXCISION PROCEDURE (LEEP);  Surgeon: [name], MD;  Location: [facility];  Service:  Gynecology;  Laterality: N/A;   NEGATIVE SLEEP STUDY  2014   per pt   ORIF LEFT FOOT FX  2008   HARDWARE  REMOVED 6 MONTHS LATER   STOMACH SURGERY  [facility]ERA GASTRIC [facility] JULY 2018   VULVECTOMY N/A 06/21/2012   Procedure: WIDE EXCISION VULVECTOMY;  Surgeon: [name], MD;  Location: [facility];  Service: Gynecology;  Laterality: N/A;  excision of vulvar lesions,  need colposcope     OB History     Gravida  0   Para      Term      Preterm      AB      Living         SAB      IAB      Ectopic      Multiple      Live Births              Family History  Problem Relation Age of Onset   Cancer Mother    Mental illness Mother    Alcohol abuse Mother    Drug abuse Mother    Bipolar disorder Sister    Alcohol abuse Maternal Aunt    Drug abuse Maternal Aunt    Depression Maternal Aunt    Schizophrenia Maternal Aunt    Alcohol abuse Maternal Uncle    Alcohol abuse Maternal Grandfather    Diabetes Father     Social History   Tobacco Use   Smoking status: Every Day    Packs/day: 0.50    Years: 25.00    Pack years: 12.50    Types: Cigarettes   Smokeless tobacco: Never  Vaping Use   Vaping Use: Never used  Substance Use Topics   Alcohol use: No   Drug use: No    Home Medications Prior to Admission medications   Medication Sig Start Date End Date Taking? Authorizing Provider  acetaminophen (TYLENOL) 500 MG tablet Take 1 tablet (500 mg total) by mouth every 4 (four) hours as needed for moderate pain. 01/04/19   Lorelee New, PA-C  amantadine (SYMMETREL) 100 MG capsule Take 100 mg by mouth 2 (two) times daily.    [provider]  buPROPion (WELLBUTRIN XL) 150 MG 24 hr tablet Take 450 mg by mouth daily.     [provider]  cetirizine (ZYRTEC) 10 MG tablet Take 10 mg by mouth daily. 09/13/19   [provider]  diazepam (VALIUM) 5 MG tablet SMARTSIG:1-2 Tablet(s) By Mouth PRN 11/30/19   [provider]  divalproex  (DEPAKOTE ER) 500 MG 24 hr tablet Take 500 mg by mouth 2 (two) times daily.     [provider]  DULoxetine (CYMBALTA) 60 MG capsule Take 60 mg by mouth 2 (two) times daily.     [provider]  levothyroxine (SYNTHROID, LEVOTHROID) 125 MCG tablet Take 250 mcg by mouth daily before breakfast.     [provider]  metFORMIN (GLUCOPHAGE) 500 MG tablet Take  1 tablet (500 mg total) by mouth 2 (two) times daily with a meal for 30 days. 04/21/18 02/03/19  Little, Ambrose Finland, MD  metoCLOPramide (REGLAN) 10 MG tablet Take 10 mg by mouth 2 (two) times daily with a meal.    [provider]  montelukast (SINGULAIR) 10 MG tablet Take 10 mg by mouth daily. 10/25/19   [provider]  pantoprazole (PROTONIX) 20 MG tablet Take 20 mg by mouth daily.    [provider]  perphenazine (TRILAFON) 4 MG tablet Take 4 mg by mouth at bedtime.    [provider]  Semaglutide,0.25 or 0.5MG /DOS, 2 MG/1.5ML SOPN Inject into the skin. 05/03/18   [provider]  zolpidem (AMBIEN) 10 MG tablet Take 10 mg by mouth at bedtime.     [provider]    Allergies    Codeine, Hydrocodone, Oxycodone, Trazodone and nefazodone, and Vicodin [hydrocodone-acetaminophen]  Review of Systems   Review of Systems  Constitutional:  Negative for activity change, appetite change, chills, fatigue and fever.  HENT:  Negative for ear pain and sore throat.   Eyes:  Negative for pain and visual disturbance.  Respiratory:  Negative for cough, chest tightness and shortness of breath.   Cardiovascular:  Negative for chest pain and palpitations.  Gastrointestinal:  Negative for abdominal pain, constipation, diarrhea, nausea and vomiting.  Genitourinary:  Negative for decreased urine volume, difficulty urinating, dysuria, flank pain, hematuria, pelvic pain and urgency.  Musculoskeletal:  Positive for arthralgias, back pain and gait problem. Negative for neck pain and neck  stiffness.  Skin:  Negative for color change, rash and wound.  Neurological:  Negative for dizziness, seizures, syncope, weakness and numbness.  All other systems reviewed and are negative.  Physical Exam Updated Vital Signs BP (!) 98/53   Pulse 74   Temp 98.4 F (36.9 C) (Oral)   Resp 16   Ht 5\' 4"  (1.626 m)   Wt (!) 143 kg   SpO2 97%   BMI 54.11 kg/m   Physical Exam Vitals and nursing note reviewed.  Constitutional:      General: She is not in acute distress.    Appearance: Normal appearance. She is well-developed. She is not ill-appearing, toxic-appearing or diaphoretic.  HENT:     Head: Normocephalic and atraumatic.     Right Ear: External ear normal.     Left Ear: External ear normal.     Nose: Nose normal.     Mouth/Throat:     Mouth: Mucous membranes are moist.     Pharynx: Oropharynx is clear.  Eyes:     Extraocular Movements: Extraocular movements intact.     Conjunctiva/sclera: Conjunctivae normal.  Cardiovascular:     Rate and Rhythm: Normal rate and regular rhythm.     Heart sounds: No murmur heard. Pulmonary:     Effort: Pulmonary effort is normal. No respiratory distress.     Breath sounds: Normal breath sounds.  Abdominal:     Palpations: Abdomen is soft.     Tenderness: There is no abdominal tenderness. There is no guarding.  Musculoskeletal:        General: Tenderness present. No deformity.     Cervical back: Normal range of motion and neck supple.     Right lower leg: No edema.     Left lower leg: No edema.  Skin:    General: Skin is warm and dry.     Coloration: Skin is not jaundiced or pale.  Neurological:  General: No focal deficit present.     Mental Status: She is alert and oriented to person, place, and time.     Cranial Nerves: No cranial nerve deficit.     Sensory: No sensory deficit.  Psychiatric:        Attention and Perception: Perception normal.        Mood and Affect: Affect normal. Mood is anxious.        Speech: Speech  normal.        Behavior: Behavior normal. Behavior is cooperative.    ED Results / Procedures / Treatments   Labs (all labs ordered are listed, but only abnormal results are displayed) Labs Reviewed  URINALYSIS, ROUTINE W REFLEX MICROSCOPIC - Abnormal; Notable for the following components:      Result Value   APPearance HAZY (*)    All other components within normal limits  PREGNANCY, URINE    EKG None  Radiology DG Hip Unilat W or Wo Pelvis 2-3 Views Right  Result Date: 01/04/2021 CLINICAL DATA:  Right hip pain for 1 week, no known injury, initial encounter EXAM: DG HIP (WITH OR WITHOUT PELVIS) 3V RIGHT COMPARISON:  None. FINDINGS: Pelvic ring is intact. Changes of prior tubal ligation are noted. No acute fracture or dislocation is noted. No soft tissue is seen. Mild degenerative changes of the hip joints are noted bilaterally. IMPRESSION: Degenerative change without acute abnormality. Electronically Signed   By: Alcide Clever M.D.   On: 01/04/2021 00:19    Procedures Procedures   Medications Ordered in ED Medications  ketorolac (TORADOL) injection 60 mg (60 mg Intramuscular Given 01/03/21 2254)  ketamine (KETALAR) injection 215 mg (215 mg Intramuscular Given 01/04/21 0121)    ED Course  I have reviewed the triage vital signs and the nursing notes.  Pertinent labs & imaging results that were available during my care of the patient were reviewed by me and considered in my medical decision making (see chart for details).    MDM Rules/Calculators/A&P                          Patient presents for persistent severe pain in the posterior aspect of her right hip that radiates down the lateral portion of her right leg.  She has a history of back pain and sciatica but states that this pain, and its severity is new.  It has been refractory to Toradol and muscle relaxers.  Her vital signs are normal upon arrival.  On exam, patient found resting in left lateral decubitus position.  Areas  of pain are tender to palpation.  Due to the pain, patient has very diminished mobility.  It is hard for her to even change position in the bed.  She denies any diminished sensation.  When assessing for strength, patient does appear to be weak in her right leg with flexion and extension.  She attributes this to limitation due to pain.  Patient has documented anaphylactic reactions to narcotics.  Because of this, pain control is challenging.  Dose of Toradol was given.  Lidocaine patch was applied.  Following this, patient was able to get into wheelchair with assistance and able to undergo x-ray imaging.  Care of patient was signed out to oncoming ED provider.  Final Clinical Impression(s) / ED Diagnoses Final diagnoses:  Sciatica of right side    Rx / DC Orders ED Discharge Orders     None        Caige Almeda,  Alycia Rossetti, MD 01/04/21 1342

## 2021-01-03 NOTE — ED Triage Notes (Signed)
Reports low back pain that radiates across the entire lower portion of back.  Reports it started a little over a week ago.  Seen by PCP.  Started on flexeril and tramadol.  Reports no relief with those medications.  Completed the tramadol.

## 2021-01-03 NOTE — ED Notes (Signed)
ED Provider at bedside. 

## 2021-01-03 NOTE — ED Notes (Signed)
Patient transported to X-ray 

## 2021-01-04 MED ORDER — KETAMINE HCL 50 MG/ML IJ SOLN
1.5000 mg/kg | Freq: Once | INTRAMUSCULAR | Status: AC
  Administered 2021-01-04: 215 mg via INTRAMUSCULAR
  Filled 2021-01-04: qty 10

## 2021-01-04 NOTE — ED Provider Notes (Signed)
1:55 AM Significant relief with IM ketamine per analgesia protocol.  She will contact her neurologist, Dr. Marisa Severin, later this morning to arrange follow-up.  She is unable to take narcotic pain medication as well as Lyrica or gabapentin.      Tinie Mcgloin, MD 01/04/21 0157

## 2021-01-04 NOTE — ED Notes (Signed)
Pt awakens to voice. Pt alert and answers questions appropriately. Pt states she is ready to go home.

## 2021-01-04 NOTE — ED Notes (Signed)
Pt very sedated. Will continue to monitor pt until more alert. VSS. Pt on continuous pulse ox.

## 2021-01-04 NOTE — ED Notes (Signed)
ED Provider at bedside. 

## 2021-04-07 ENCOUNTER — Emergency Department (HOSPITAL_BASED_OUTPATIENT_CLINIC_OR_DEPARTMENT_OTHER): Payer: Medicare Other

## 2021-04-07 ENCOUNTER — Emergency Department (HOSPITAL_BASED_OUTPATIENT_CLINIC_OR_DEPARTMENT_OTHER)
Admission: EM | Admit: 2021-04-07 | Discharge: 2021-04-08 | Disposition: A | Discharge status: Home / Self Care | Payer: Medicare Other | Attending: Emergency Medicine | Admitting: Emergency Medicine

## 2021-04-07 ENCOUNTER — Encounter (HOSPITAL_BASED_OUTPATIENT_CLINIC_OR_DEPARTMENT_OTHER): Payer: Self-pay | Admitting: Emergency Medicine

## 2021-04-07 ENCOUNTER — Other Ambulatory Visit: Payer: Self-pay

## 2021-04-07 DIAGNOSIS — Z20822 Contact with and (suspected) exposure to covid-19: Secondary | ICD-10-CM | POA: Diagnosis not present

## 2021-04-07 DIAGNOSIS — R079 Chest pain, unspecified: Secondary | ICD-10-CM | POA: Insufficient documentation

## 2021-04-07 DIAGNOSIS — R002 Palpitations: Secondary | ICD-10-CM | POA: Insufficient documentation

## 2021-04-07 DIAGNOSIS — R0789 Other chest pain: Secondary | ICD-10-CM

## 2021-04-07 LAB — BASIC METABOLIC PANEL
Anion gap: 10 (ref 5–15)
BUN: 22 mg/dL — ABNORMAL HIGH (ref 6–20)
CO2: 23 mmol/L (ref 22–32)
Calcium: 9.5 mg/dL (ref 8.9–10.3)
Chloride: 103 mmol/L (ref 98–111)
Creatinine, Ser: 0.71 mg/dL (ref 0.44–1.00)
GFR, Estimated: >60 mL/min (ref >60)
Glucose, Bld: 94 mg/dL (ref 70–99)
Potassium: 3.9 mmol/L (ref 3.5–5.1)
Sodium: 136 mmol/L (ref 135–145)

## 2021-04-07 LAB — CBC
HCT: 41.9 % (ref 36.0–46.0)
Hemoglobin: 14.7 g/dL (ref 12.0–15.0)
MCH: 31.1 pg (ref 26.0–34.0)
MCHC: 35.1 g/dL (ref 30.0–36.0)
MCV: 88.6 fL (ref 80.0–100.0)
Platelets: 208 10*3/uL (ref 150–400)
RBC: 4.73 MIL/uL (ref 3.87–5.11)
RDW: 12.2 % (ref 11.5–15.5)
WBC: 5.9 10*3/uL (ref 4.0–10.5)
nRBC: 0 % (ref 0.0–0.2)

## 2021-04-07 LAB — TROPONIN I (HIGH SENSITIVITY): Troponin I (High Sensitivity): 2 ng/L (ref <18)

## 2021-04-07 LAB — RESP PANEL BY RT-PCR (FLU A&B, COVID) ARPGX2
Influenza A by PCR: NEGATIVE
Influenza B by PCR: NEGATIVE
SARS Coronavirus 2 by RT PCR: NEGATIVE

## 2021-04-07 LAB — D-DIMER, QUANTITATIVE: D-Dimer, Quant: 0.63 ug/mL-FEU — ABNORMAL HIGH (ref 0.00–0.50)

## 2021-04-07 MED ORDER — SODIUM CHLORIDE 0.9 % IV BOLUS
1000.0000 mL | Freq: Once | INTRAVENOUS | Status: AC
  Administered 2021-04-07: 1000 mL via INTRAVENOUS

## 2021-04-07 MED ORDER — IOHEXOL 350 MG/ML SOLN
100.0000 mL | Freq: Once | INTRAVENOUS | Status: AC | PRN
  Administered 2021-04-07: 100 mL via INTRAVENOUS

## 2021-04-07 NOTE — ED Triage Notes (Signed)
Reports sudden onset of right sided chest pain and elevated HR up to 150 that lasted for 6 minutes while watching tv tonight.  Reports now just has a mild ache in chest.

## 2021-04-07 NOTE — ED Provider Notes (Signed)
[facility] Provider Note   CSN: [id number] Arrival date & time: 04/07/21  2152     History  Chief Complaint  Patient presents with   Chest Pain   Palpitations    Lindsey Nicholson is a 51 y.o. female.  The history is provided by the patient.  Palpitations Palpitations quality:  Fast Onset quality:  Sudden Duration:  6 minutes Progression:  Resolved Chronicity:  New Context: not anxiety and not caffeine   Relieved by:  Nothing Worsened by:  Nothing Associated symptoms: chest pain (brief)   Associated symptoms: no back pain, no chest pressure, no cough, no diaphoresis, no dizziness, no hemoptysis, no leg pain, no lower extremity edema, no malaise/fatigue, no nausea, no near-syncope, no numbness, no orthopnea, no PND, no shortness of breath, no syncope, no vomiting and no weakness   Risk factors: diabetes mellitus   Risk factors: no heart disease, no hx of atrial fibrillation and no hx of PE       Home Medications Prior to Admission medications   Medication Sig Start Date End Date Taking? Authorizing Provider  acetaminophen (TYLENOL) 500 MG tablet Take 1 tablet (500 mg total) by mouth every 4 (four) hours as needed for moderate pain. 01/04/19   Corena Herter, PA-C  amantadine (SYMMETREL) 100 MG capsule Take 100 mg by mouth 2 (two) times daily.    [provider]  buPROPion (WELLBUTRIN XL) 150 MG 24 hr tablet Take 450 mg by mouth daily.     [provider]  cetirizine (ZYRTEC) 10 MG tablet Take 10 mg by mouth daily. 09/13/19   [provider]  diazepam (VALIUM) 5 MG tablet SMARTSIG:1-2 Tablet(s) By Mouth PRN 11/30/19   [provider]  divalproex (DEPAKOTE ER) 500 MG 24 hr tablet Take 500 mg by mouth 2 (two) times daily.     [provider]  DULoxetine (CYMBALTA) 60 MG capsule Take 60 mg by mouth 2 (two) times daily.     [provider]  levothyroxine (SYNTHROID, LEVOTHROID) 125 MCG tablet Take  250 mcg by mouth daily before breakfast.     [provider]  metFORMIN (GLUCOPHAGE) 500 MG tablet Take 1 tablet (500 mg total) by mouth 2 (two) times daily with a meal for 30 days. 04/21/18 02/03/19  Little, Wenda Overland, MD  metoCLOPramide (REGLAN) 10 MG tablet Take 10 mg by mouth 2 (two) times daily with a meal.    [provider]  montelukast (SINGULAIR) 10 MG tablet Take 10 mg by mouth daily. 10/25/19   [provider]  pantoprazole (PROTONIX) 20 MG tablet Take 20 mg by mouth daily.    [provider]  perphenazine (TRILAFON) 4 MG tablet Take 4 mg by mouth at bedtime.    [provider]  Semaglutide,0.25 or 0.5MG /DOS, 2 MG/1.5ML SOPN Inject into the skin. 05/03/18   [provider]  zolpidem (AMBIEN) 10 MG tablet Take 10 mg by mouth at bedtime.     [provider]      Allergies    Codeine, Hydrocodone, Oxycodone, Trazodone and nefazodone, and Vicodin [hydrocodone-acetaminophen]    Review of Systems   Review of Systems  Constitutional:  Negative for chills, diaphoresis, fever and malaise/fatigue.  HENT:  Negative for ear pain and sore throat.   Eyes:  Negative for pain and visual disturbance.  Respiratory:  Negative for cough, hemoptysis and shortness of breath.   Cardiovascular:  Positive for chest pain (brief) and palpitations. Negative for orthopnea, syncope,  PND and near-syncope.  Gastrointestinal:  Negative for abdominal pain, nausea and vomiting.  Genitourinary:  Negative for dysuria and hematuria.  Musculoskeletal:  Negative for arthralgias and back pain.  Skin:  Negative for color change and rash.  Neurological:  Negative for dizziness, seizures, syncope, weakness and numbness.  All other systems reviewed and are negative.  Physical Exam Updated Vital Signs  ED Triage Vitals  Enc Vitals Group     BP 04/07/21 2200 112/80     Pulse Rate 04/07/21 2200 88     Resp 04/07/21 2200 18     Temp 04/07/21 2200 98 F (36.7  C)     Temp Source 04/07/21 2200 Oral     SpO2 04/07/21 2200 100 %     Weight 04/07/21 2157 232 lb (105.2 kg)     Height 04/07/21 2157 5' 3.5" (1.613 m)     Head Circumference --      Peak Flow --      Pain Score 04/07/21 2157 2     Pain Loc --      Pain Edu? --      Excl. in [place]? --     Physical Exam Vitals and nursing note reviewed.  Constitutional:      General: She is not in acute distress.    Appearance: She is well-developed. She is not ill-appearing.  HENT:     Head: Normocephalic and atraumatic.  Eyes:     Extraocular Movements: Extraocular movements intact.     Conjunctiva/sclera: Conjunctivae normal.     Pupils: Pupils are equal, round, and reactive to light.  Cardiovascular:     Rate and Rhythm: Normal rate and regular rhythm.     Pulses:          Radial pulses are 2+ on the right side and 2+ on the left side.     Heart sounds: Normal heart sounds. No murmur heard. Pulmonary:     Effort: Pulmonary effort is normal. No respiratory distress.     Breath sounds: Normal breath sounds. No decreased breath sounds, wheezing, rhonchi or rales.  Abdominal:     Palpations: Abdomen is soft.     Tenderness: There is no abdominal tenderness.  Musculoskeletal:        General: No swelling. Normal range of motion.     Cervical back: Normal range of motion and neck supple.     Right lower leg: No edema.     Left lower leg: No edema.  Skin:    General: Skin is warm and dry.     Capillary Refill: Capillary refill takes less than 2 seconds.  Neurological:     General: No focal deficit present.     Mental Status: She is alert and oriented to person, place, and time.     Cranial Nerves: Cranial nerve deficit present.  Psychiatric:        Mood and Affect: Mood normal.    ED Results / Procedures / Treatments   Labs (all labs ordered are listed, but only abnormal results are displayed) Labs Reviewed  D-DIMER, QUANTITATIVE - Abnormal; Notable for the following components:       Result Value   D-Dimer, Quant 0.63 (*)    All other components within normal limits  RESP PANEL BY RT-PCR (FLU A&B, COVID) ARPGX2  CBC  BASIC METABOLIC PANEL  TROPONIN I (HIGH SENSITIVITY)    EKG EKG Interpretation  Date/Time:  Sunday April 07 2021 22:00:47 EST Ventricular Rate:  90 PR Interval:  167 QRS Duration: 94 QT Interval:  347 QTC Calculation: 425 R Axis:   -2 Text Interpretation: Sinus rhythm Low voltage, precordial leads Abnormal R-wave progression, early transition Borderline T abnormalities, anterior leads Confirmed by Lennice Sites [phone]) on 04/07/2021 10:04:39 PM  Radiology DG Chest Port 1 View  Result Date: 04/07/2021 CLINICAL DATA:  Right-sided chest pain EXAM: PORTABLE CHEST 1 VIEW COMPARISON:  05/03/2018 FINDINGS: The heart size and mediastinal contours are within normal limits. Both lungs are clear. The visualized skeletal structures are unremarkable. IMPRESSION: No active disease. Electronically Signed   By: Donavan Foil M.D.   On: 04/07/2021 22:24    Procedures Procedures    Medications Ordered in ED Medications  sodium chloride 0.9 % bolus 1,000 mL (1,000 mLs Intravenous New Bag/Given 04/07/21 2220)    ED Course/ Medical Decision Making/ A&P                           Medical Decision Making  Adayla Radebaugh is here with palpitations and brief chest pressure.  Normal vitals.  No fever.  Her watch told her that she was having heart rate that was fast may be 150s for several minutes.  She felt palpitations and pressure and did not feel well but did not pass out and had lightheadedness.  Overall she is feeling better.  She has no history of abnormal heart rate.  She has history of diabetes but no other significant cardiac history.  Overall her EKG ordered and interpreted by myself shows sinus rhythm.  No ischemic changes.  Hard to know if this was true tachycardic event given that it was on her Fitbit.  She was somewhat anxious during this time as well.  But  will check troponin, D-dimer, basic labs.  Will give fluid bolus.  We will continue to monitor cardiac telemetry.  My differential diagnosis I have a much lower suspicion for ACS or blood clot.  Could be electrolyte abnormality, could be symptomatic PACs or PVCs.  She has no PE risk factors were given concern for tachycardia will get a D-dimer.  Heart score is 2.  Will get troponin.  10:36 PM D-dimer is elevated.  Will get CT scan to further evaluate for blood clot.  Thus far lab work also reviewed and interpreted by myself shows no significant anemia, leukocytosis, kidney injury.  Troponin within normal limits.  Chest x-ray interpreted and evaluated by myself shows no pneumonia or pneumothorax.  Patient handed off to oncoming ED staff with patient pending CT scan of her chest as well as repeat troponin.  Anticipate if work-up is normal she can be discharged home with cardiology follow-up with she states is already her primary care doctor for event monitor.  Please see oncoming ED staff note for further results, evaluation, disposition of the patient.  This chart was dictated using voice recognition software.  Despite best efforts to proofread,  errors can occur which can change the documentation meaning.         Final Clinical Impression(s) / ED Diagnoses Final diagnoses:  Palpitations    Rx / DC Orders ED Discharge Orders     None         Lennice Sites, DO 04/07/21 2236

## 2021-04-07 NOTE — ED Provider Notes (Signed)
Care assumed from Dr. Lockie Mola.  Patient increasing heartrate, palpitations and chest pressure that lasted about 5 minutes and has since resolved.  EKG shows nonspecific T wave changes but were present in 2011.  Troponin negative  CT PE pending.  CT negative for pulmonary embolism.  Troponin negative x2.  Low suspicion for ACS.  CT negative for pulmonary embolism or other acute pathology. No further episodes of chest pain or shortness of breath.  Discussed with patient.  Follow-up with PCP as well as cardiology for Holter monitor and further evaluation.  Return to the ED with exertional chest pain, chest pain associate with shortness of breath, nausea, vomiting, diaphoresis or any other concerns   Lindsey Octave, MD 04/08/21 0136

## 2021-04-08 LAB — TROPONIN I (HIGH SENSITIVITY): Troponin I (High Sensitivity): 2 ng/L (ref <18)

## 2021-04-08 NOTE — Discharge Instructions (Addendum)
Your testing is negative for heart attack or blood clot in the lung.  Follow-up with your primary doctor and cardiologist.  Return to the ED with exertional chest pain, chest pain associate with shortness of breath, nausea, vomiting, sweating, any other concerns.

## 2021-04-10 ENCOUNTER — Telehealth: Payer: Self-pay

## 2021-04-10 NOTE — Telephone Encounter (Signed)
Notes scanned to referral 

## 2021-05-14 ENCOUNTER — Emergency Department (HOSPITAL_BASED_OUTPATIENT_CLINIC_OR_DEPARTMENT_OTHER)
Admission: EM | Admit: 2021-05-14 | Discharge: 2021-05-14 | Disposition: A | Discharge status: Home / Self Care | Payer: Medicare Other | Attending: Emergency Medicine | Admitting: Emergency Medicine

## 2021-05-14 ENCOUNTER — Encounter (HOSPITAL_BASED_OUTPATIENT_CLINIC_OR_DEPARTMENT_OTHER): Payer: Self-pay

## 2021-05-14 ENCOUNTER — Emergency Department (HOSPITAL_BASED_OUTPATIENT_CLINIC_OR_DEPARTMENT_OTHER): Payer: Medicare Other

## 2021-05-14 ENCOUNTER — Other Ambulatory Visit: Payer: Self-pay

## 2021-05-14 DIAGNOSIS — R519 Headache, unspecified: Secondary | ICD-10-CM

## 2021-05-14 DIAGNOSIS — J40 Bronchitis, not specified as acute or chronic: Secondary | ICD-10-CM

## 2021-05-14 DIAGNOSIS — J209 Acute bronchitis, unspecified: Secondary | ICD-10-CM | POA: Diagnosis not present

## 2021-05-14 DIAGNOSIS — E039 Hypothyroidism, unspecified: Secondary | ICD-10-CM | POA: Insufficient documentation

## 2021-05-14 DIAGNOSIS — J069 Acute upper respiratory infection, unspecified: Secondary | ICD-10-CM | POA: Diagnosis not present

## 2021-05-14 DIAGNOSIS — R059 Cough, unspecified: Secondary | ICD-10-CM | POA: Diagnosis present

## 2021-05-14 DIAGNOSIS — Z79899 Other long term (current) drug therapy: Secondary | ICD-10-CM | POA: Diagnosis not present

## 2021-05-14 MED ORDER — DIPHENHYDRAMINE HCL 50 MG/ML IJ SOLN
12.5000 mg | Freq: Once | INTRAMUSCULAR | Status: AC
  Administered 2021-05-14: 12.5 mg via INTRAVENOUS
  Filled 2021-05-14: qty 1

## 2021-05-14 MED ORDER — KETOROLAC TROMETHAMINE 15 MG/ML IJ SOLN
15.0000 mg | Freq: Once | INTRAMUSCULAR | Status: AC
  Administered 2021-05-14: 15 mg via INTRAVENOUS
  Filled 2021-05-14: qty 1

## 2021-05-14 MED ORDER — SODIUM CHLORIDE 0.9 % IV BOLUS
1000.0000 mL | Freq: Once | INTRAVENOUS | Status: AC
  Administered 2021-05-14: 1000 mL via INTRAVENOUS

## 2021-05-14 MED ORDER — PROCHLORPERAZINE EDISYLATE 10 MG/2ML IJ SOLN
10.0000 mg | Freq: Once | INTRAMUSCULAR | Status: AC
  Administered 2021-05-14: 10 mg via INTRAVENOUS
  Filled 2021-05-14: qty 2

## 2021-05-14 NOTE — Discharge Instructions (Addendum)
You were seen in the emergency department today for an upper respiratory infection.  As we discussed your chest x-ray did not show any obvious signs of pneumonia.  As we discussed it is possible for this to missed pneumonia sometimes, but you are on the right medications prescribed by the doctor you saw in [place].  I like you to continue the medicines that you have previously been taking.  I am glad that you feel little bit better after the IV medicines today.  You can continue ibuprofen and Tylenol on a more regular basis for any persistent headache or pain.  Continue to monitor how you are doing and return to the emergency department for any new or worsening symptoms such as persistent fever, inability to keep down medication, worsening shortness of breath.

## 2021-05-14 NOTE — ED Triage Notes (Signed)
Diagnosed w/bronchitis in [place] on Fri, Covid/Flu (-)neg per PCP. C/o HA and coughing. Wants CXR.

## 2021-05-14 NOTE — ED Provider Notes (Signed)
[facility] EMERGENCY DEPARTMENT Provider Note   CSN: [id number] Arrival date & time: 05/14/21  1550     History  Chief Complaint  Patient presents with   URI    Lindsey Nicholson is a 51 y.o. female who presents to the emergency department with headache and cough.  Patient states that she was diagnosed with bronchitis in [place] on Friday.  She had negative COVID and flu testing per her PCP.  She presents today requesting a chest x-ray.  She was prescribed doxycycline, Medrol Dosepak, albuterol inhaler, and Tessalon Perles.   URI Presenting symptoms: congestion and cough   Presenting symptoms: no fever   Associated symptoms: headaches      Past Medical History:  Diagnosis Date   Anxiety    Bipolar 1 disorder (HCC)    Borderline personality disorder (HCC)    Borderline personality disorder (HCC)    Cervical dysplasia    GERD (gastroesophageal reflux disease)    History of vulvar dysplasia    vin I  --- s/p  wide excision 06-21-2012    Hypothyroidism    Moderate major depression (HCC)    Morbid obesity (HCC)    NAFL (nonalcoholic fatty liver)    Pre-diabetes    Premature ovarian failure    Wears glasses     Home Medications Prior to Admission medications   Medication Sig Start Date End Date Taking? Authorizing Provider  acetaminophen (TYLENOL) 500 MG tablet Take 1 tablet (500 mg total) by mouth every 4 (four) hours as needed for moderate pain. 01/04/19   Lorelee New, PA-C  amantadine (SYMMETREL) 100 MG capsule Take 100 mg by mouth 2 (two) times daily.    [provider]  buPROPion (WELLBUTRIN XL) 150 MG 24 hr tablet Take 450 mg by mouth daily.     [provider]  cetirizine (ZYRTEC) 10 MG tablet Take 10 mg by mouth daily. 09/13/19   [provider]  diazepam (VALIUM) 5 MG tablet SMARTSIG:1-2 Tablet(s) By Mouth PRN 11/30/19   [provider]  divalproex (DEPAKOTE ER) 500 MG 24 hr tablet Take 500 mg by mouth 2 (two) times  daily.     [provider]  DULoxetine (CYMBALTA) 60 MG capsule Take 60 mg by mouth 2 (two) times daily.     [provider]  levothyroxine (SYNTHROID, LEVOTHROID) 125 MCG tablet Take 250 mcg by mouth daily before breakfast.     [provider]  metFORMIN (GLUCOPHAGE) 500 MG tablet Take 1 tablet (500 mg total) by mouth 2 (two) times daily with a meal for 30 days. 04/21/18 02/03/19  Little, Ambrose Finland, MD  metoCLOPramide (REGLAN) 10 MG tablet Take 10 mg by mouth 2 (two) times daily with a meal.    [provider]  montelukast (SINGULAIR) 10 MG tablet Take 10 mg by mouth daily. 10/25/19   [provider]  pantoprazole (PROTONIX) 20 MG tablet Take 20 mg by mouth daily.    [provider]  perphenazine (TRILAFON) 4 MG tablet Take 4 mg by mouth at bedtime.    [provider]  Semaglutide,0.25 or 0.5MG /DOS, 2 MG/1.5ML SOPN Inject into the skin. 05/03/18   [provider]  zolpidem (AMBIEN) 10 MG tablet Take 10 mg by mouth at bedtime.     [provider]      Allergies    Codeine, Hydrocodone, Oxycodone, Trazodone and nefazodone, and Vicodin [hydrocodone-acetaminophen]    Review of Systems   Review of Systems  Constitutional:  Negative  for chills and fever.  HENT:  Positive for congestion.   Respiratory:  Positive for cough. Negative for shortness of breath.   Cardiovascular:  Negative for chest pain.  Gastrointestinal:  Negative for abdominal pain, constipation, diarrhea, nausea and vomiting.  Neurological:  Positive for headaches.  All other systems reviewed and are negative.  Physical Exam Updated Vital Signs BP 124/89 (BP Location: Right Arm)    Pulse 82    Temp 98.6 F (37 C) (Oral)    Resp 20    Ht 5' 3.5" (1.613 m)    Wt 102.5 kg    SpO2 98%    BMI 39.41 kg/m  Physical Exam Vitals and nursing note reviewed.  Constitutional:      Appearance: Normal appearance.     Comments: Patient is laying in exam bed  with a washcloth over her face  HENT:     Head: Normocephalic and atraumatic.  Eyes:     Conjunctiva/sclera: Conjunctivae normal.  Cardiovascular:     Rate and Rhythm: Normal rate and regular rhythm.  Pulmonary:     Effort: Pulmonary effort is normal. No respiratory distress.     Breath sounds: Wheezing and rhonchi present.     Comments: Expiratory wheezing and rhonchi diffusely in all lung fields Abdominal:     General: There is no distension.     Palpations: Abdomen is soft.     Tenderness: There is no abdominal tenderness.  Skin:    General: Skin is warm and dry.  Neurological:     General: No focal deficit present.     Mental Status: She is alert.    ED Results / Procedures / Treatments   Labs (all labs ordered are listed, but only abnormal results are displayed) Labs Reviewed - No data to display  EKG None  Radiology DG Chest 2 View  Result Date: 05/14/2021 CLINICAL DATA:  Shortness of breath. EXAM: CHEST - 2 VIEW COMPARISON:  April 07, 2021. FINDINGS: The heart size and mediastinal contours are within normal limits. Both lungs are clear. The visualized skeletal structures are unremarkable. IMPRESSION: No active cardiopulmonary disease. Electronically Signed   By: Lupita Raider M.D.   On: 05/14/2021 16:31    Procedures Procedures    Medications Ordered in ED Medications  sodium chloride 0.9 % bolus 1,000 mL (1,000 mLs Intravenous New Bag/Given 05/14/21 1708)  ketorolac (TORADOL) 15 MG/ML injection 15 mg (15 mg Intravenous Given 05/14/21 1709)  prochlorperazine (COMPAZINE) injection 10 mg (10 mg Intravenous Given 05/14/21 1710)  diphenhydrAMINE (BENADRYL) injection 12.5 mg (12.5 mg Intravenous Given 05/14/21 1710)    ED Course/ Medical Decision Making/ A&P                           Medical Decision Making Amount and/or Complexity of Data Reviewed Radiology: ordered.   Patient is a 51 year old female who presents the emergency department complaining of  headache and cough for over a week.  She was diagnosed with bronchitis, and placed on several medications.  She presents today complaining of a headache, and is requesting a chest x-ray.  Patient tested negative for COVID and flu at her PCP yesterday.  On my exam patient is afebrile, not tachycardic, not hypoxic, and in no acute distress.  On auscultation of her lungs she does have expiratory wheezing and rhonchi diffusely in all lung fields.  Chest x-ray performed and personally interpreted by me, shows no acute cardiopulmonary abnormalities.  I agree  with the radiologist interpretation.  Patient is complaining of headache, will give migraine cocktail.  On reevaluation patient states that her symptoms are much improved, and she is ready to go home.  I explained to her that the medications that she was prescribed by her primary doctor, as well as the diagnosing physician saw last week in [place], or the right medications for her bronchitis.  I do not believe she is requiring any medication adjustments at this time.  I have low concern for any developing worsening infection as she is afebrile, with good oxygen saturation and a clear chest x-ray.  For that reason I do not believe she is requiring admission or inpatient treatment for her symptoms.  We discussed reasons to return to the emergency department, and patient is agreeable to the plan.  Final Clinical Impression(s) / ED Diagnoses Final diagnoses:  Viral upper respiratory tract infection  Bronchitis  Acute nonintractable headache, unspecified headache type    Rx / DC Orders ED Discharge Orders     None      Portions of this report may have been transcribed using voice recognition software. Every effort was made to ensure accuracy; however, inadvertent computerized transcription errors may be present.    Su Monks, PA-C 05/14/21 1739    Melene Plan, DO 05/14/21 [phone]

## 2021-05-27 ENCOUNTER — Institutional Professional Consult (permissible substitution): Payer: Medicare Other | Admitting: Cardiology

## 2021-06-14 ENCOUNTER — Institutional Professional Consult (permissible substitution): Payer: Medicare Other | Admitting: Cardiology

## 2021-07-08 ENCOUNTER — Other Ambulatory Visit: Payer: Self-pay

## 2021-07-08 ENCOUNTER — Encounter (HOSPITAL_BASED_OUTPATIENT_CLINIC_OR_DEPARTMENT_OTHER): Payer: Self-pay

## 2021-07-08 ENCOUNTER — Emergency Department (HOSPITAL_BASED_OUTPATIENT_CLINIC_OR_DEPARTMENT_OTHER): Payer: Medicare Other

## 2021-07-08 ENCOUNTER — Emergency Department (HOSPITAL_BASED_OUTPATIENT_CLINIC_OR_DEPARTMENT_OTHER)
Admission: EM | Admit: 2021-07-08 | Discharge: 2021-07-09 | Disposition: A | Discharge status: Other Acute Inpatient Hospital | Payer: Medicare Other | Attending: Emergency Medicine | Admitting: Emergency Medicine

## 2021-07-08 DIAGNOSIS — R45851 Suicidal ideations: Secondary | ICD-10-CM | POA: Insufficient documentation

## 2021-07-08 DIAGNOSIS — E039 Hypothyroidism, unspecified: Secondary | ICD-10-CM | POA: Diagnosis not present

## 2021-07-08 DIAGNOSIS — F339 Major depressive disorder, recurrent, unspecified: Secondary | ICD-10-CM | POA: Diagnosis not present

## 2021-07-08 DIAGNOSIS — F32A Depression, unspecified: Secondary | ICD-10-CM | POA: Diagnosis present

## 2021-07-08 DIAGNOSIS — Z20822 Contact with and (suspected) exposure to covid-19: Secondary | ICD-10-CM | POA: Insufficient documentation

## 2021-07-08 DIAGNOSIS — Z79899 Other long term (current) drug therapy: Secondary | ICD-10-CM | POA: Insufficient documentation

## 2021-07-08 LAB — RAPID URINE DRUG SCREEN, HOSP PERFORMED
Amphetamines: NOT DETECTED
Barbiturates: NOT DETECTED
Benzodiazepines: POSITIVE — AB
Cocaine: NOT DETECTED
Opiates: NOT DETECTED
Tetrahydrocannabinol: NOT DETECTED

## 2021-07-08 LAB — COMPREHENSIVE METABOLIC PANEL
ALT: 31 U/L (ref 0–44)
AST: 27 U/L (ref 15–41)
Albumin: 4.2 g/dL (ref 3.5–5.0)
Alkaline Phosphatase: 79 U/L (ref 38–126)
Anion gap: 10 (ref 5–15)
BUN: 16 mg/dL (ref 6–20)
CO2: 27 mmol/L (ref 22–32)
Calcium: 9.4 mg/dL (ref 8.9–10.3)
Chloride: 102 mmol/L (ref 98–111)
Creatinine, Ser: 0.9 mg/dL (ref 0.44–1.00)
GFR, Estimated: >60 mL/min (ref >60)
Glucose, Bld: 105 mg/dL — ABNORMAL HIGH (ref 70–99)
Potassium: 3.9 mmol/L (ref 3.5–5.1)
Sodium: 139 mmol/L (ref 135–145)
Total Bilirubin: 0.6 mg/dL (ref 0.3–1.2)
Total Protein: 7.7 g/dL (ref 6.5–8.1)

## 2021-07-08 LAB — CBC
HCT: 42 % (ref 36.0–46.0)
Hemoglobin: 14.8 g/dL (ref 12.0–15.0)
MCH: 30.6 pg (ref 26.0–34.0)
MCHC: 35.2 g/dL (ref 30.0–36.0)
MCV: 87 fL (ref 80.0–100.0)
Platelets: 154 10*3/uL (ref 150–400)
RBC: 4.83 MIL/uL (ref 3.87–5.11)
RDW: 12.4 % (ref 11.5–15.5)
WBC: 3.5 10*3/uL — ABNORMAL LOW (ref 4.0–10.5)
nRBC: 0 % (ref 0.0–0.2)

## 2021-07-08 LAB — RESP PANEL BY RT-PCR (FLU A&B, COVID) ARPGX2
Influenza A by PCR: NEGATIVE
Influenza B by PCR: NEGATIVE
SARS Coronavirus 2 by RT PCR: NEGATIVE

## 2021-07-08 LAB — PREGNANCY, URINE: Preg Test, Ur: NEGATIVE

## 2021-07-08 LAB — ACETAMINOPHEN LEVEL: Acetaminophen (Tylenol), Serum: <10 ug/mL — ABNORMAL LOW (ref 10–30)

## 2021-07-08 LAB — SALICYLATE LEVEL: Salicylate Lvl: <7 mg/dL — ABNORMAL LOW (ref 7.0–30.0)

## 2021-07-08 LAB — ETHANOL: Alcohol, Ethyl (B): <10 mg/dL (ref <10)

## 2021-07-08 MED ORDER — ACETAMINOPHEN 500 MG PO TABS
500.0000 mg | ORAL_TABLET | ORAL | Status: DC | PRN
  Administered 2021-07-09 (×2): 500 mg via ORAL
  Filled 2021-07-08 (×2): qty 1

## 2021-07-08 MED ORDER — DIVALPROEX SODIUM 125 MG PO CSDR
500.0000 mg | DELAYED_RELEASE_CAPSULE | ORAL | Status: DC
Start: 2021-07-08 — End: 2021-07-09

## 2021-07-08 MED ORDER — LORATADINE 10 MG PO TABS
10.0000 mg | ORAL_TABLET | Freq: Every day | ORAL | Status: DC
  Administered 2021-07-09: 10 mg via ORAL
  Filled 2021-07-08: qty 1

## 2021-07-08 MED ORDER — NICOTINE 21 MG/24HR TD PT24
21.0000 mg | MEDICATED_PATCH | Freq: Once | TRANSDERMAL | Status: DC
  Administered 2021-07-08: 21 mg via TRANSDERMAL
  Filled 2021-07-08: qty 1

## 2021-07-08 MED ORDER — DULOXETINE HCL 60 MG PO CPEP
60.0000 mg | ORAL_CAPSULE | Freq: Two times a day (BID) | ORAL | Status: DC
  Filled 2021-07-08 (×2): qty 1

## 2021-07-08 MED ORDER — AMANTADINE HCL 100 MG PO CAPS
100.0000 mg | ORAL_CAPSULE | Freq: Two times a day (BID) | ORAL | Status: DC
  Administered 2021-07-09: 100 mg via ORAL
  Filled 2021-07-08 (×3): qty 1

## 2021-07-08 MED ORDER — PANTOPRAZOLE SODIUM 40 MG PO TBEC
40.0000 mg | DELAYED_RELEASE_TABLET | Freq: Every day | ORAL | Status: DC
  Administered 2021-07-09: 40 mg via ORAL
  Filled 2021-07-08: qty 1

## 2021-07-08 MED ORDER — LUMATEPERONE TOSYLATE 42 MG PO CAPS
42.0000 mg | ORAL_CAPSULE | Freq: Every day | ORAL | Status: DC
  Filled 2021-07-08 (×2): qty 1

## 2021-07-08 MED ORDER — MONTELUKAST SODIUM 10 MG PO TABS
10.0000 mg | ORAL_TABLET | Freq: Every day | ORAL | Status: DC
  Administered 2021-07-09: 10 mg via ORAL
  Filled 2021-07-08 (×2): qty 1

## 2021-07-08 MED ORDER — LEVOTHYROXINE SODIUM 25 MCG PO TABS: 250.0000 ug | ORAL_TABLET | Freq: Every day | ORAL | Status: DC

## 2021-07-08 NOTE — ED Triage Notes (Addendum)
Patient tearful in triage.  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  Admits to suicidal ideation/intent without specific plan.  Admits to having a gun in the home; however, is an antique and non-working.  States she does not have any working firearms in the home or on her person.   ?

## 2021-07-08 NOTE — ED Provider Notes (Signed)
?[facility] EMERGENCY DEPARTMENT ?Provider Note ? ? ?CSN: [id number] ?Arrival date & time: 07/08/21  2042 ? ?  ? ?History ? ?Chief Complaint  ?Patient presents with  ? Mental Health Problem  ? ? ?Lindsey Nicholson is a 51 y.o. female. ? ?HPI ? ?  ? ?51 year old female with a history of bipolar 1 disorder, anxiety, major depression, hypothyroidism gastric sleeve resection, who presents with concern for depression and suicidal ideation. ? ?Reports her symptoms have been worsening over the last 3 weeks.  She has a history of cutting over 10 years ago, but has not had the urge to do this again until recently.  She reports feelings of severe depression, having suicidal ideation, is tearful and states "I just do not know what to do anymore."  She is feeling hopeless, and stated she is "drowning, overwhelmed and tired."  When asked if she has a plan, states that she does think about cutting.  She also had mentioned in triage that she has a gun in the home however the antique and not working.  She has been taking her medications as prescribed, and has continued to feel worse.  Wellbutrin was stopped about a month ago.  Has had cough for one week worse in the AM.  Denies shortness of breath, nausea, vomiting, fever, leg pain or swelling, chest pain. ? ?Past Medical History:  ?Diagnosis Date  ? Anxiety   ? Bipolar 1 disorder (HCC)   ? Borderline personality disorder (HCC)   ? Borderline personality disorder (HCC)   ? Cervical dysplasia   ? GERD (gastroesophageal reflux disease)   ? History of vulvar dysplasia   ? vin I  --- s/p  wide excision 06-21-2012   ? Hypothyroidism   ? Moderate major depression (HCC)   ? Morbid obesity (HCC)   ? NAFL (nonalcoholic fatty liver)   ? Pre-diabetes   ? Premature ovarian failure   ? Wears glasses   ?  ?Past Surgical History:  ?Procedure Laterality Date  ? CARPAL TUNNEL RELEASE Bilateral LEFT  2013//  RIGHT  JAN  2015  ? CHOLECYSTECTOMY  2011  ? DILATATION & CURETTAGE/HYSTEROSCOPY WITH  MYOSURE N/A 05/04/2018  ? Procedure: DILATATION & CURETTAGE/HYSTEROSCOPY WITH MYOSURE  POLYPECTOMY AND FAILED NOVASURE ABLATION;  Surgeon: Richarda Overlie, MD;  Location: [facility];  Service: Gynecology;  Laterality: N/A;  ? HYSTEROSCOPY WITH D & C  10/17/2010  ? Procedure: DILATATION AND CURETTAGE (D&C) /HYSTEROSCOPY;  Surgeon: Jessee Avers;  Location: [facility];  Service: Gynecology;  Laterality: N/A;  ? HYSTEROSCOPY WITH D & C N/A 06/21/2012  ? Procedure: DILATATION AND CURETTAGE /HYSTEROSCOPY;  Surgeon: [name], MD;  Location: [facility];  Service: Gynecology;  Laterality: N/A;  ? KNEE ARTHROSCOPY WITH MEDIAL MENISECTOMY Left 04/15/2017  ? Procedure: Left knee arthroscopic partial medial, lateral and partial menisectomies with lateral release;  Surgeon: Yolonda Kida, MD;  Location: [facility];  Service: Orthopedics;  Laterality: Left;  ? LAPAROSCOPIC GASTRIC BAND REMOVAL WITH LAPAROSCOPIC GASTRIC SLEEVE RESECTION N/A   ? LAPAROSCOPIC TUBAL LIGATION  01/09/2011  ? Procedure: LAPAROSCOPIC TUBAL LIGATION;  Surgeon: [name];  Location: [facility];  Service: Gynecology;  Laterality: Bilateral;  with [place] clips  ? LEEP  01/09/2011  ? Procedure: LOOP ELECTROSURGICAL EXCISION PROCEDURE (LEEP);  Surgeon: [name];  Location: [facility];  Service: Gynecology;  Laterality: N/A;  ? LEEP N/A 09/16/2013  ? Procedure: LOOP ELECTROSURGICAL EXCISION PROCEDURE (LEEP);  Surgeon: [name], MD;  Location:   SUR[facility]ERY CENTER;  Service: Gynecology;  Laterality: N/A;  ? NEGATIVE SLEEP STUDY  2014  ? per pt  ? ORIF LEFT FOOT FX  2008  ? HARDWARE  REMOVED 6 MONTHS LATER  ? STOMACH SURGERY  OBERA GASTRIC BALLON JULY 2018  ? VULVECTOMY N/A 06/21/2012  ? Procedure: WIDE EXCISION VULVECTOMY;  Surgeon: Meriel Picaichard M Holland, MD;  Location: [facility];  Service: Gynecology;  Laterality: N/A;  excision of vulvar lesions,  need colposcope  ?  ?Home Medications ?Prior to Admission medications   ?Medication Sig Start  Date End Date Taking? Authorizing Provider  ?acetaminophen (TYLENOL) 500 MG tablet Take 1 tablet (500 mg total) by mouth every 4 (four) hours as needed for moderate pain. 01/04/19   Lorelee NewGreen, Garrett L, PA-C  ?amantadine (SYMMETREL) 100 MG capsule Take 100 mg by mouth 2 (two) times daily.    [provider]  ?buPROPion (WELLBUTRIN XL) 150 MG 24 hr tablet Take 450 mg by mouth daily.     [provider]  ?cetirizine (ZYRTEC) 10 MG tablet Take 10 mg by mouth daily. 09/13/19   [provider]  ?diazepam (VALIUM) 5 MG tablet SMARTSIG:1-2 Tablet(s) By Mouth PRN 11/30/19   [provider]  ?divalproex (DEPAKOTE ER) 500 MG 24 hr tablet Take 500 mg by mouth 2 (two) times daily.     [provider]  ?DULoxetine (CYMBALTA) 60 MG capsule Take 60 mg by mouth 2 (two) times daily.     [provider]  ?levothyroxine (SYNTHROID, LEVOTHROID) 125 MCG tablet Take 250 mcg by mouth daily before breakfast.     [provider]  ?metFORMIN (GLUCOPHAGE) 500 MG tablet Take 1 tablet (500 mg total) by mouth 2 (two) times daily with a meal for 30 days. 04/21/18 02/03/19  Little, Ambrose Finlandachel Morgan, MD  ?metoCLOPramide (REGLAN) 10 MG tablet Take 10 mg by mouth 2 (two) times daily with a meal.    [provider]  ?montelukast (SINGULAIR) 10 MG tablet Take 10 mg by mouth daily. 10/25/19   [provider]  ?pantoprazole (PROTONIX) 20 MG tablet Take 20 mg by mouth daily.    [provider]  ?perphenazine (TRILAFON) 4 MG tablet Take 4 mg by mouth at bedtime.    [provider]  ?Semaglutide,0.25 or 0.5MG /DOS, 2 MG/1.5ML SOPN Inject into the skin. 05/03/18   [provider]  ?zolpidem (AMBIEN) 10 MG tablet Take 10 mg by mouth at bedtime.     [provider]  ?   ? ?Allergies    ?Codeine, Oxycodone, Oxycodone-acetaminophen, Trazodone and nefazodone, and Vicodin [hydrocodone-acetaminophen]   ? ?Review of Systems   ?Review of Systems ?See above ?Physical  Exam ?Updated Vital Signs ?BP 101/70 (BP Location: Right Arm)   Pulse 88   Temp 98.1 ?F (36.7 ?C) (Oral)   Resp 14   Ht 5\' 4"  (1.626 m)   Wt 104.3 kg   SpO2 94%   BMI 39.48 kg/m?  ?Physical Exam ?Vitals and nursing note reviewed.  ?Constitutional:   ?   General: She is not in acute distress. ?   Appearance: She is well-developed. She is not diaphoretic.  ?HENT:  ?   Head: Normocephalic and atraumatic.  ?Eyes:  ?   Conjunctiva/sclera: Conjunctivae normal.  ?Cardiovascular:  ?   Rate and Rhythm: Normal rate and regular rhythm.  ?   Heart sounds: Normal heart sounds. No murmur heard. ?  No friction rub. No gallop.  ?Pulmonary:  ?   Effort: Pulmonary  effort is normal. No respiratory distress.  ?   Breath sounds: Normal breath sounds. No wheezing or rales.  ?Abdominal:  ?   General: There is no distension.  ?   Palpations: Abdomen is soft.  ?   Tenderness: There is no abdominal tenderness. There is no guarding.  ?Musculoskeletal:     ?   General: No tenderness.  ?   Cervical back: Normal range of motion.  ?Skin: ?   General: Skin is warm and dry.  ?   Findings: No erythema or rash.  ?Neurological:  ?   Mental Status: She is alert and oriented to person, place, and time.  ? ? ?ED Results / Procedures / Treatments   ?Labs ?(all labs ordered are listed, but only abnormal results are displayed) ?Labs Reviewed  ?COMPREHENSIVE METABOLIC PANEL - Abnormal; Notable for the following components:  ?    Result Value  ? Glucose, Bld 105 (*)   ? All other components within normal limits  ?SALICYLATE LEVEL - Abnormal; Notable for the following components:  ? Salicylate Lvl <7.0 (*)   ? All other components within normal limits  ?ACETAMINOPHEN LEVEL - Abnormal; Notable for the following components:  ? Acetaminophen (Tylenol), Serum <10 (*)   ? All other components within normal limits  ?CBC - Abnormal; Notable for the following components:  ? WBC 3.5 (*)   ? All other components within normal limits  ?RAPID URINE DRUG SCREEN,  HOSP PERFORMED - Abnormal; Notable for the following components:  ? Benzodiazepines POSITIVE (*)   ? All other components within normal limits  ?RESP PANEL BY RT-PCR (FLU A&B, COVID) ARPGX2  ?ETHANOL  ?PREGNANCY,

## 2021-07-08 NOTE — ED Notes (Signed)
Called staffing office, No sitter available at this time  ?

## 2021-07-09 ENCOUNTER — Encounter (HOSPITAL_COMMUNITY): Payer: Self-pay | Admitting: Psychiatry

## 2021-07-09 ENCOUNTER — Inpatient Hospital Stay (HOSPITAL_COMMUNITY)
Admission: AD | Admit: 2021-07-09 | Discharge: 2021-07-12 | DRG: 885 | Disposition: A | Discharge status: Home / Self Care | Payer: Medicare Other | Source: Intra-hospital | Attending: Psychiatry | Admitting: Psychiatry

## 2021-07-09 DIAGNOSIS — Z560 Unemployment, unspecified: Secondary | ICD-10-CM | POA: Diagnosis not present

## 2021-07-09 DIAGNOSIS — F319 Bipolar disorder, unspecified: Secondary | ICD-10-CM | POA: Diagnosis present

## 2021-07-09 DIAGNOSIS — Z885 Allergy status to narcotic agent status: Secondary | ICD-10-CM | POA: Diagnosis not present

## 2021-07-09 DIAGNOSIS — G250 Essential tremor: Secondary | ICD-10-CM | POA: Diagnosis present

## 2021-07-09 DIAGNOSIS — F603 Borderline personality disorder: Secondary | ICD-10-CM | POA: Diagnosis present

## 2021-07-09 DIAGNOSIS — Z62810 Personal history of physical and sexual abuse in childhood: Secondary | ICD-10-CM | POA: Diagnosis present

## 2021-07-09 DIAGNOSIS — F339 Major depressive disorder, recurrent, unspecified: Secondary | ICD-10-CM | POA: Diagnosis not present

## 2021-07-09 DIAGNOSIS — F3181 Bipolar II disorder: Secondary | ICD-10-CM | POA: Diagnosis present

## 2021-07-09 DIAGNOSIS — Z79899 Other long term (current) drug therapy: Secondary | ICD-10-CM

## 2021-07-09 DIAGNOSIS — K760 Fatty (change of) liver, not elsewhere classified: Secondary | ICD-10-CM | POA: Diagnosis present

## 2021-07-09 DIAGNOSIS — Z716 Tobacco abuse counseling: Secondary | ICD-10-CM | POA: Diagnosis not present

## 2021-07-09 DIAGNOSIS — E039 Hypothyroidism, unspecified: Secondary | ICD-10-CM | POA: Diagnosis present

## 2021-07-09 DIAGNOSIS — R45851 Suicidal ideations: Secondary | ICD-10-CM | POA: Diagnosis present

## 2021-07-09 DIAGNOSIS — Z9049 Acquired absence of other specified parts of digestive tract: Secondary | ICD-10-CM | POA: Diagnosis not present

## 2021-07-09 DIAGNOSIS — Z87412 Personal history of vulvar dysplasia: Secondary | ICD-10-CM

## 2021-07-09 DIAGNOSIS — M25552 Pain in left hip: Secondary | ICD-10-CM | POA: Diagnosis present

## 2021-07-09 DIAGNOSIS — F411 Generalized anxiety disorder: Secondary | ICD-10-CM | POA: Diagnosis present

## 2021-07-09 DIAGNOSIS — R7303 Prediabetes: Secondary | ICD-10-CM | POA: Diagnosis present

## 2021-07-09 DIAGNOSIS — Z79890 Hormone replacement therapy (postmenopausal): Secondary | ICD-10-CM

## 2021-07-09 DIAGNOSIS — F17210 Nicotine dependence, cigarettes, uncomplicated: Secondary | ICD-10-CM | POA: Diagnosis present

## 2021-07-09 DIAGNOSIS — M25551 Pain in right hip: Secondary | ICD-10-CM | POA: Diagnosis present

## 2021-07-09 DIAGNOSIS — F4310 Post-traumatic stress disorder, unspecified: Secondary | ICD-10-CM

## 2021-07-09 DIAGNOSIS — Z9884 Bariatric surgery status: Secondary | ICD-10-CM

## 2021-07-09 DIAGNOSIS — Z813 Family history of other psychoactive substance abuse and dependence: Secondary | ICD-10-CM | POA: Diagnosis not present

## 2021-07-09 DIAGNOSIS — G4700 Insomnia, unspecified: Secondary | ICD-10-CM | POA: Diagnosis present

## 2021-07-09 DIAGNOSIS — Z818 Family history of other mental and behavioral disorders: Secondary | ICD-10-CM | POA: Diagnosis not present

## 2021-07-09 MED ORDER — HYDROXYZINE HCL 50 MG PO TABS
50.0000 mg | ORAL_TABLET | Freq: Every day | ORAL | Status: DC
  Administered 2021-07-09 – 2021-07-11 (×3): 50 mg via ORAL
  Filled 2021-07-09 (×7): qty 1

## 2021-07-09 MED ORDER — DIVALPROEX SODIUM ER 250 MG PO TB24: 500.0000 mg | ORAL_TABLET | Freq: Every day | ORAL | Status: DC

## 2021-07-09 MED ORDER — ALUM & MAG HYDROXIDE-SIMETH 200-200-20 MG/5ML PO SUSP: 30.0000 mL | ORAL | Status: DC | PRN

## 2021-07-09 MED ORDER — LIDOCAINE 5 % EX PTCH
1.0000 | MEDICATED_PATCH | CUTANEOUS | Status: DC
  Administered 2021-07-09: 1 via TRANSDERMAL
  Filled 2021-07-09 (×3): qty 1

## 2021-07-09 MED ORDER — LUMATEPERONE TOSYLATE 42 MG PO CAPS
42.0000 mg | ORAL_CAPSULE | Freq: Every day | ORAL | Status: DC
  Administered 2021-07-09 – 2021-07-11 (×3): 42 mg via ORAL
  Filled 2021-07-09 (×6): qty 1

## 2021-07-09 MED ORDER — LEVOTHYROXINE SODIUM 150 MCG PO TABS
300.0000 ug | ORAL_TABLET | Freq: Every day | ORAL | Status: DC
  Administered 2021-07-10: 300 ug via ORAL
  Filled 2021-07-09 (×2): qty 2

## 2021-07-09 MED ORDER — DIVALPROEX SODIUM ER 250 MG PO TB24
1000.0000 mg | ORAL_TABLET | Freq: Every day | ORAL | Status: DC
  Administered 2021-07-09: 1000 mg via ORAL
  Filled 2021-07-09: qty 4

## 2021-07-09 MED ORDER — LEVOTHYROXINE SODIUM 100 MCG PO TABS
300.0000 ug | ORAL_TABLET | Freq: Every day | ORAL | Status: DC
  Administered 2021-07-09: 300 ug via ORAL
  Filled 2021-07-09: qty 3

## 2021-07-09 MED ORDER — AMANTADINE HCL 100 MG PO CAPS
100.0000 mg | ORAL_CAPSULE | Freq: Once | ORAL | Status: AC
  Administered 2021-07-09: 100 mg via ORAL
  Filled 2021-07-09 (×3): qty 1

## 2021-07-09 MED ORDER — DIVALPROEX SODIUM ER 500 MG PO TB24
1000.0000 mg | ORAL_TABLET | Freq: Every day | ORAL | Status: DC
  Filled 2021-07-09: qty 2

## 2021-07-09 MED ORDER — LORATADINE 10 MG PO TABS
10.0000 mg | ORAL_TABLET | Freq: Every day | ORAL | Status: DC
  Administered 2021-07-10 – 2021-07-12 (×3): 10 mg via ORAL
  Filled 2021-07-09 (×5): qty 1

## 2021-07-09 MED ORDER — LUMATEPERONE TOSYLATE 42 MG PO CAPS
42.0000 mg | ORAL_CAPSULE | Freq: Once | ORAL | Status: AC
  Administered 2021-07-09: 42 mg via ORAL
  Filled 2021-07-09 (×2): qty 1

## 2021-07-09 MED ORDER — HYDROXYZINE HCL 25 MG PO TABS
25.0000 mg | ORAL_TABLET | Freq: Three times a day (TID) | ORAL | Status: DC | PRN
  Administered 2021-07-12: 25 mg via ORAL
  Filled 2021-07-09: qty 1

## 2021-07-09 MED ORDER — NICOTINE 21 MG/24HR TD PT24
21.0000 mg | MEDICATED_PATCH | Freq: Every day | TRANSDERMAL | Status: DC
  Filled 2021-07-09 (×6): qty 1

## 2021-07-09 MED ORDER — DULOXETINE HCL 60 MG PO CPEP
60.0000 mg | ORAL_CAPSULE | Freq: Once | ORAL | Status: AC
  Administered 2021-07-09: 60 mg via ORAL
  Filled 2021-07-09 (×3): qty 1

## 2021-07-09 MED ORDER — AMANTADINE HCL 100 MG PO CAPS
100.0000 mg | ORAL_CAPSULE | Freq: Two times a day (BID) | ORAL | Status: DC
  Administered 2021-07-09: 100 mg via ORAL
  Filled 2021-07-09 (×5): qty 1

## 2021-07-09 MED ORDER — ACETAMINOPHEN 325 MG PO TABS
650.0000 mg | ORAL_TABLET | Freq: Four times a day (QID) | ORAL | Status: DC | PRN
  Administered 2021-07-10 – 2021-07-12 (×6): 650 mg via ORAL
  Filled 2021-07-09 (×7): qty 2

## 2021-07-09 MED ORDER — LEVOTHYROXINE SODIUM 125 MCG PO TABS
125.0000 ug | ORAL_TABLET | Freq: Every day | ORAL | Status: DC
  Filled 2021-07-09: qty 1

## 2021-07-09 MED ORDER — NICOTINE POLACRILEX 2 MG MT GUM
2.0000 mg | CHEWING_GUM | Freq: Once | OROMUCOSAL | Status: DC
  Filled 2021-07-09: qty 1

## 2021-07-09 MED ORDER — DULOXETINE HCL 60 MG PO CPEP
60.0000 mg | ORAL_CAPSULE | Freq: Every day | ORAL | Status: DC
  Administered 2021-07-10 – 2021-07-12 (×3): 60 mg via ORAL
  Filled 2021-07-09 (×5): qty 1

## 2021-07-09 MED ORDER — MAGNESIUM HYDROXIDE 400 MG/5ML PO SUSP
30.0000 mL | Freq: Every day | ORAL | Status: DC | PRN
Start: 2021-07-09 — End: 2021-07-12

## 2021-07-09 MED ORDER — ENSURE ENLIVE PO LIQD
237.0000 mL | Freq: Three times a day (TID) | ORAL | Status: DC
  Administered 2021-07-09 – 2021-07-10 (×2): 237 mL via ORAL
  Filled 2021-07-09 (×9): qty 237

## 2021-07-09 MED ORDER — DIVALPROEX SODIUM ER 500 MG PO TB24
500.0000 mg | ORAL_TABLET | Freq: Every day | ORAL | Status: DC
  Administered 2021-07-11: 500 mg via ORAL
  Filled 2021-07-09 (×3): qty 1

## 2021-07-09 MED ORDER — DIVALPROEX SODIUM ER 500 MG PO TB24
1500.0000 mg | ORAL_TABLET | Freq: Every day | ORAL | Status: DC
  Filled 2021-07-09: qty 3

## 2021-07-09 MED ORDER — DIVALPROEX SODIUM ER 500 MG PO TB24
1000.0000 mg | ORAL_TABLET | Freq: Every day | ORAL | Status: AC
  Administered 2021-07-09 – 2021-07-10 (×2): 1000 mg via ORAL
  Filled 2021-07-09 (×2): qty 2
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
## 2021-07-09 NOTE — ED Notes (Addendum)
Able to remove all but 2 earrings in right ear and one in left ear with presence of security. Removed naval ring All earrings placed in belongings bag. Pt wanded by security ?

## 2021-07-09 NOTE — ED Notes (Signed)
Called safe transport and arranged transportation for the patient to [facility] ?

## 2021-07-09 NOTE — ED Notes (Addendum)
Report given to Elizabeth at [facility] 

## 2021-07-09 NOTE — ED Notes (Signed)
Secretary notified to arrange transport with [facility] ?
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
## 2021-07-09 NOTE — ED Notes (Signed)
Necklace given to husband ?

## 2021-07-09 NOTE — ED Notes (Signed)
Pt transported to [facility] with safe transport personnel. All belongings sent with patient ?

## 2021-07-09 NOTE — ED Notes (Signed)
Voluntary commitment form signed by patient and faxed to [facility] ?

## 2021-07-09 NOTE — [facility] Suicide Risk Assessment (Signed)
[facility] Admission Suicide Risk Assessment ? ? ?Nursing information obtained from:    ?Demographic factors:  NA ?Current Mental Status:  Suicidal ideation indicated by patient ?Loss Factors:  NA ?Historical Factors:  Prior suicide attempts ?Risk Reduction Factors:  Living with another person, especially a relative, Positive social support ? ?Total Time spent with patient: 45 minutes ?Principal Problem: Bipolar 2 disorder, major depressive episode (HCC) ?Diagnosis:  Principal Problem: ?  Bipolar 2 disorder, major depressive episode (HCC) ?Active Problems: ?  PTSD (post-traumatic stress disorder) ?  GAD (generalized anxiety disorder) ?  Borderline personality disorder (HCC) ? ?Subjective Data:  ?Lindsey Nicholson is a 51 y.o. female with a hx of borderline personality disorder, bipolar disorder, anxiety, physiologic tremor, and severe depression with recurrence who voluntarily presents to [facility] for Suicidal thoughts and feeling "overwhelmed, tired, and drowning."  Pt reported an onset of symptoms roughly three weeks ago that has progressively worsened as she has dealt with family related issues.  Patient has worsening intensity of suicidal thoughts over the past few days leading up to admission.  She has multiple psychosocial stressors.  She also has significant personality disorder symptoms, which put her at an elevated risk for self-harm.  She has a history of previous psychiatric hospitalization and also history of suicide attempts.  Patient reports adherence with psychiatric medications.  Patient's outpatient psychiatrist is currently cross tapering from one mood stabilizer to another. ? ? ? ? ?Continued Clinical Symptoms:  ?Alcohol Use Disorder Identification Test Final Score (AUDIT): 0 ?The "Alcohol Use Disorders Identification Test", Guidelines for Use in Primary Care, Second Edition.  World Science writer [facility]). ?Score between 0-7:  no or low risk or alcohol related problems. ?Score between 8-15:  moderate risk of  alcohol related problems. ?Score between 16-19:  high risk of alcohol related problems. ?Score 20 or above:  warrants further diagnostic evaluation for alcohol dependence and treatment. ? ? ?CLINICAL FACTORS:  ? Severe Anxiety and/or Agitation ?Bipolar Disorder:   Depressive phase ?Personality Disorders:   Cluster B ?More than one psychiatric diagnosis ?Unstable or Poor Therapeutic Relationship ?Previous Psychiatric Diagnoses and Treatments ? ? ?Musculoskeletal: ?Strength & Muscle Tone: within normal limits ?Gait & Station: normal ?Patient leans: N/A ? ?Psychiatric Specialty Exam: ? ?Presentation  ?General Appearance: Casual ? ?Eye Contact:Good ? ?Speech:Normal Rate ? ?Speech Volume:Normal ? ?Handedness:No data recorded ? ?Mood and Affect  ?Mood:Anxious; Dysphoric; Depressed ? ?Affect:Congruent; Constricted ? ? ?Thought Process  ?Thought Processes:Linear ? ?Descriptions of Associations:Intact ? ?Orientation:Full (Time, Place and Person) ? ?Thought Content:Logical ? ?History of Schizophrenia/Schizoaffective disorder:No data recorded ?Duration of Psychotic Symptoms:No data recorded ?Hallucinations:Hallucinations: None ? ?Ideas of Reference:None ? ?Suicidal Thoughts:Suicidal Thoughts: -- (suicidal thoughts on admission) ? ?Homicidal Thoughts:Homicidal Thoughts: No ? ? ?Sensorium  ?Memory:Immediate Good; Recent Good; Remote Good ? ?Judgment:Fair ? ?Insight:Fair ? ? ?Executive Functions  ?Concentration:Fair ? ?Attention Span:Fair ? ?Recall:No data recorded ?Fund of Knowledge:No data recorded ?Language:No data recorded ? ?Psychomotor Activity  ?Psychomotor Activity:Psychomotor Activity: Tremor ? ? ?Assets  ?Assets:No data recorded ? ?Sleep  ?Sleep:Sleep: Poor ? ? ? ?Physical Exam: ?Physical Exam See H&P ? ?ROS See H&P ? ?Blood pressure 105/65, pulse 70, temperature 98.8 ?F (37.1 ?C), temperature source Oral, resp. rate 16, height 5\' 4"  (1.626 m), weight 105.4 kg, SpO2 100 %. Body mass index is 39.89 kg/m?. ? ? ?COGNITIVE  FEATURES THAT CONTRIBUTE TO RISK:  ?None   ? ?SUICIDE RISK:  ? Moderate:  Frequent suicidal ideation with limited intensity, and duration, some specificity in terms  of plans, no associated intent, good self-control, limited dysphoria/symptomatology, some risk factors present, and identifiable protective factors, including available and accessible social support. ? ?PLAN OF CARE:  ? ?See my assessment, diagnosis list, and plan in the H&P. ? ?I certify that inpatient services furnished can reasonably be expected to improve the patient's condition.  ? ?Cristy Hilts, MD ?07/09/2021, 5:59 PM ? ?

## 2021-07-09 NOTE — Tx Team (Signed)
Initial Treatment Plan ?07/09/2021 ?12:33 PM ?Lindsey Nicholson ?[id number] ? ? ? ?PATIENT STRESSORS: ?Marital or family conflict   ?Medication change or noncompliance   ?Other: new puppy   ? ? ?PATIENT STRENGTHS: ?Capable of independent living  ?Financial means  ?Motivation for treatment/growth  ?Supportive family/friends  ? ? ?PATIENT IDENTIFIED PROBLEMS: ?Mood instability/uncontrollable shopping   ?Mother   ?New puppy   ?Medications no longer working   ?  ?  ?  ?  ?  ?  ? ?DISCHARGE CRITERIA:  ?Improved stabilization in mood, thinking, and/or behavior ?Safe-care adequate arrangements made ?Verbal commitment to aftercare and medication compliance ? ?PRELIMINARY DISCHARGE PLAN: ?Attend PHP/IOP ?Outpatient therapy ?Return to previous living arrangement ? ?PATIENT/FAMILY INVOLVEMENT: ?This treatment plan has been presented to and reviewed with the patient, Lindsey Nicholson.  The patient has been given the opportunity to ask questions and make suggestions. ? ?Gerrianne Scale, RN ?07/09/2021, 12:33 PM ?

## 2021-07-09 NOTE — ED Notes (Addendum)
Pt is sitting on side of bed eating meal with styrofoam bowl and plastic utensils.  ?

## 2021-07-09 NOTE — ED Notes (Addendum)
Removed 2 books, hairbrush and pen from room that was covered up in chair with a pillow. ?

## 2021-07-09 NOTE — ED Provider Notes (Signed)
Emergency Medicine Observation Re-evaluation Note ? ?Lindsey Nicholson is a 51 y.o. female, seen on rounds today.  Pt initially presented to the ED for complaints of Suicidal ?Currently, the patient is sleeping. ? ?Physical Exam  ?BP 101/70 (BP Location: Right Arm)   Pulse 88   Temp 98.1 ?F (36.7 ?C) (Oral)   Resp 14   Ht 5\' 4"  (1.626 m)   Wt 104.3 kg   SpO2 94%   BMI 39.48 kg/m?  ?Physical Exam ?General: No distress ?Lungs: Resp even and unlabored ?Psych: sleeping soundly ? ?ED Course / MDM  ?EKG:  ? ?I have reviewed the labs performed to date as well as medications administered while in observation.  Recent changes in the last 24 hours include TTS evaluation, recommend inpatient, no beds at [facility] will need placement. ? ?Plan  ?Current plan is for Inpatient psych admission. ? [facility] is not under involuntary commitment. ? ? ?  ?[name], MD ?07/09/21 [phone] ? ?

## 2021-07-09 NOTE — ED Notes (Addendum)
Secretary unable to get in contact with transport. Voicemail left. Will cont to attempt to reach  transport ?
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
## 2021-07-09 NOTE — Group Note (Signed)
Recreation Therapy Group Note ? ? ?Group Topic:Animal Assisted Therapy   ?Group Date: 07/09/2021 ?Start Time: 1430 ?End Time: [id number] ?Facilitators: Victorino Sparrow, LRT,CTRS ?Location: [facility] ? ? ?Animal-Assisted Activity (AAA) Program Checklist/Progress Note ?Patient Eligibility Criteria Checklist & Daily Group note for Rec Tx Intervention ? ?AAA/T Program Assumption of Risk Form signed by Patient/ or Parent Legal Guardian YES ? ?Patient understands their participation is voluntary YES ? ?Group Description: Patients provided opportunity to interact with trained and credentialed [employer] Therapy dog and the community volunteer/dog handler. Patients practiced appropriate animal interaction and were educated on dog safety outside of the hospital in common community settings. Patients were allowed to use dog toys and other items to practice commands, engage the dog in play, and/or complete routine aspects of animal care.  ? ?Education: Contractor, Pensions consultant, Communication & Social Skills  ? ? ?Affect/Mood: N/A ?  ?Participation Level: Did not attend ?  ? ?Clinical Observations/Individualized Feedback:   ? ? ?Plan: Continue to engage patient in RT group sessions 2-3x/week. ? ? ?Victorino Sparrow, LRT,CTRS ?07/09/2021 3:47 PM ?

## 2021-07-09 NOTE — ED Notes (Addendum)
Pt has rings on every finger on both hands as well as studs in ears. States jewelry is unable to come off. Report received from night nurse he had attempted to remove rings, but unable ?Necklace placed in belongings bag ?

## 2021-07-10 ENCOUNTER — Encounter (HOSPITAL_COMMUNITY): Payer: Self-pay

## 2021-07-10 DIAGNOSIS — F319 Bipolar disorder, unspecified: Secondary | ICD-10-CM | POA: Diagnosis not present

## 2021-07-10 LAB — LIPID PANEL
Cholesterol: 158 mg/dL (ref 0–200)
HDL: 29 mg/dL — ABNORMAL LOW (ref >40)
LDL Cholesterol: 79 mg/dL (ref 0–99)
Total CHOL/HDL Ratio: 5.4 RATIO
Triglycerides: 250 mg/dL — ABNORMAL HIGH (ref <150)
VLDL: 50 mg/dL — ABNORMAL HIGH (ref 0–40)

## 2021-07-10 LAB — TSH: TSH: 0.204 u[IU]/mL — ABNORMAL LOW (ref 0.350–4.500)

## 2021-07-10 LAB — HEMOGLOBIN A1C
Hgb A1c MFr Bld: 4.7 % — ABNORMAL LOW (ref 4.8–5.6)
Mean Plasma Glucose: 88.19 mg/dL

## 2021-07-10 LAB — GLUCOSE, CAPILLARY: Glucose-Capillary: 88 mg/dL (ref 70–99)

## 2021-07-10 LAB — VALPROIC ACID LEVEL: Valproic Acid Lvl: 25 ug/mL — ABNORMAL LOW (ref 50.0–100.0)

## 2021-07-10 MED ORDER — LEVOTHYROXINE SODIUM 125 MCG PO TABS: 250.0000 ug | ORAL_TABLET | Freq: Every day | ORAL | Status: DC

## 2021-07-10 MED ORDER — LIDOCAINE 5 % EX PTCH
2.0000 | MEDICATED_PATCH | CUTANEOUS | Status: DC
  Administered 2021-07-10 – 2021-07-11 (×2): 2 via TRANSDERMAL
  Filled 2021-07-10 (×4): qty 2

## 2021-07-10 MED ORDER — BOOST / RESOURCE BREEZE PO LIQD CUSTOM
1.0000 | Freq: Three times a day (TID) | ORAL | Status: DC
  Filled 2021-07-10 (×3): qty 1

## 2021-07-10 MED ORDER — GLUCERNA SHAKE PO LIQD
237.0000 mL | Freq: Three times a day (TID) | ORAL | Status: DC
  Administered 2021-07-10 – 2021-07-11 (×2): 237 mL via ORAL
  Filled 2021-07-10 (×12): qty 237

## 2021-07-10 MED ORDER — LEVOTHYROXINE SODIUM 100 MCG PO TABS
300.0000 ug | ORAL_TABLET | Freq: Every day | ORAL | Status: DC
  Filled 2021-07-10: qty 3

## 2021-07-10 MED ORDER — AMANTADINE HCL 100 MG PO CAPS
100.0000 mg | ORAL_CAPSULE | Freq: Every day | ORAL | Status: AC
  Administered 2021-07-10 – 2021-07-12 (×3): 100 mg via ORAL
  Filled 2021-07-10 (×3): qty 1

## 2021-07-10 MED ORDER — LEVOTHYROXINE SODIUM 125 MCG PO TABS
250.0000 ug | ORAL_TABLET | Freq: Every day | ORAL | Status: DC
  Administered 2021-07-11 – 2021-07-12 (×2): 250 ug via ORAL
  Filled 2021-07-10 (×4): qty 2

## 2021-07-10 MED ORDER — GLUCERNA 1.2 CAL PO LIQD: 1000.0000 mL | ORAL | Status: DC

## 2021-07-10 NOTE — Progress Notes (Signed)
Patient did not attend morning orientation group.  

## 2021-07-10 NOTE — Group Note (Signed)
Recreation Therapy Group Note ? ? ?Group Topic:Stress Management  ?Group Date: 07/10/2021 ?Start Time: 57 ?End Time: 0950 ?Facilitators: Victorino Sparrow, LRT,CTRS ?Location: [place] ? ? ?Goal Area(s) Addresses:  ?Patient will identify positive stress management techniques. ?Patient will identify benefits of using stress management post d/c. ? ?Group Description:  Meditation.  LRT played a meditation that focused on easing anxiety through a breathing technique.  Patients were to listen and follow along as meditation played to focus on breathing and using to their benefit of calming anxiety.  ? ? ?Affect/Mood: N/A ?  ?Participation Level: Did not attend ?  ? ?Clinical Observations/Individualized Feedback:   ? ? ?Plan: Continue to engage patient in RT group sessions 2-3x/week. ? ? ?Victorino Sparrow, LRT,CTRS  ?07/10/2021 11:07 AM ?

## 2021-07-10 NOTE — Progress Notes (Signed)
Patient ID: Lindsey Nicholson, female   DOB: November 12, 1970, 51 y.o.   MRN: [id number] ?Patient asked about special drinks brought in from outside. Explained to patient that we do not allow food brought in from the outside; however, pharmacy may be able to bring down some fruit-flavored drinks. Patient seems to be satisfied with the boost drinks.  ?

## 2021-07-10 NOTE — Group Note (Signed)
LCSW Group Therapy ? ?Type of Therapy and Topic:  Group Therapy: Thoughts, Feelings, and Actions ? ?Participation Level:  Did Not Attend ? ? ?Description of Group:   ?In this group, each patient discussed their previous experiencing and understanding of overthinking, identifying the harmful impact on their lives. As a group, each patient was introduced to the basic concepts of Cognitive Behavioral Therapy: that thoughts, feelings, and actions are all connected and influence one another. They were given examples of how overthinking can affect our feelings, actions, and vise versa. The group was then asked to analyze how overthinking was harmful and brainstorm alternative thinking patterns/reactions to the example situation. Then, each group member filled out and identified their own example situation in which a problem situation caused their thoughts, feelings, and actions to be negatively impacted; they were asked to come up with 3 new (more adaptive/positive) thoughts that led to 3 new feelings and actions. ? ?Therapeutic Goals: ?Patients will review and discuss their past experience with overthinking. ?Patients will learn the basics of the CBT model through group-led examples.Marland Kitchen ?Patients will identify situations where they may have negative thoughts, feelings, or actions and will then reframe the situation using more positive thoughts to react differently. ? ?Summary of Patient Progress:  Did not attend.  ? ?Therapeutic Modalities:   ?Cognitive Behavioral Therapy ?Mindfulness ? ?Cadynce Garrette E Wilder Kurowski, LCSW ?07/10/2021  1:48 PM   ? ?

## 2021-07-10 NOTE — Progress Notes (Signed)
Patient ID: Lindsey Nicholson, female   DOB: 1971-03-20, 51 y.o.   MRN: [id number] ?Additional lidocaine patch ordered for other hip.  ?

## 2021-07-10 NOTE — BHH Group Notes (Signed)
BHH Group Notes:  (Nursing/MHT/Case Management/Adjunct) ? ?Date:  07/10/2021  ?Time:  8:29 PM ? ?Type of Therapy:  Group Therapy ? ?Participation Level:  Active ? ?Participation Quality:  Appropriate ? ?Affect:  Appropriate ? ?Cognitive:  Appropriate ? ?Insight:  Appropriate ? ?Engagement in Group:  Developing/Improving and Supportive ? ?Modes of Intervention:  Support ? ?Summary of Progress/Problems: Patient in group engaged and participating ? ?Lindsey Nicholson  Lindsey Nicholson ?07/10/2021, 8:29 PM ?
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
## 2021-07-10 NOTE — BH IP Treatment Plan (Signed)
Interdisciplinary Treatment and Diagnostic Plan Update ? ?07/10/2021 ?Time of Session: 9:10am  ?Loyce Dys ?MRN: [id number] ? ?Principal Diagnosis: Bipolar 2 disorder, major depressive episode (Lindsey Nicholson) ? ?Secondary Diagnoses: Principal Problem: ?  Bipolar 2 disorder, major depressive episode (Lindsey Nicholson) ?Active Problems: ?  PTSD (post-traumatic stress disorder) ?  GAD (generalized anxiety disorder) ?  Borderline personality disorder (Lindsey Nicholson) ? ? ?Current Medications:  ?Current Facility-Administered Medications  ?Medication Dose Route Frequency Provider Last Rate Last Admin  ? acetaminophen (TYLENOL) tablet 650 mg  650 mg Oral Q6H PRN Massengill, Ovid Curd, MD   650 mg at 07/10/21 0509  ? alum & mag hydroxide-simeth (MAALOX/MYLANTA) 200-200-20 MG/5ML suspension 30 mL  30 mL Oral Q4H PRN Derrill Center, NP      ? [START ON 07/11/2021] amantadine (SYMMETREL) capsule 100 mg  100 mg Oral Daily Massengill, Nathan, MD   100 mg at 07/10/21 1135  ? divalproex (DEPAKOTE ER) 24 hr tablet 1,000 mg  1,000 mg Oral QHS Massengill, Ovid Curd, MD   1,000 mg at 07/09/21 2107  ? [START ON 07/11/2021] divalproex (DEPAKOTE ER) 24 hr tablet 500 mg  500 mg Oral QHS Massengill, Nathan, MD      ? DULoxetine (CYMBALTA) DR capsule 60 mg  60 mg Oral Daily Derrill Center, NP   60 mg at 07/10/21 1137  ? feeding supplement (ENSURE ENLIVE / ENSURE PLUS) liquid 237 mL  237 mL Oral TID BM Massengill, Nathan, MD   237 mL at 07/10/21 1334  ? hydrOXYzine (ATARAX) tablet 25 mg  25 mg Oral TID PRN Derrill Center, NP      ? hydrOXYzine (ATARAX) tablet 50 mg  50 mg Oral QHS Janine Limbo, MD   50 mg at 07/09/21 2108  ? [START ON 07/11/2021] levothyroxine (SYNTHROID) tablet 250 mcg  250 mcg Oral Q0600 Massengill, Ovid Curd, MD      ? lidocaine (LIDODERM) 5 % 2 patch  2 patch Transdermal Q24H Massengill, Nathan, MD      ? loratadine (CLARITIN) tablet 10 mg  10 mg Oral Daily Derrill Center, NP   10 mg at 07/10/21 1137  ? lumateperone tosylate (CAPLYTA) capsule 42 mg  42  mg Oral QHS Massengill, Ovid Curd, MD   42 mg at 07/09/21 2108  ? magnesium hydroxide (MILK OF MAGNESIA) suspension 30 mL  30 mL Oral Daily PRN Derrill Center, NP      ? nicotine (NICODERM CQ - dosed in mg/24 hours) patch 21 mg  21 mg Transdermal Daily Massengill, Ovid Curd, MD      ? ?PTA Medications: ?Medications Prior to Admission  ?Medication Sig Dispense Refill Last Dose  ? hydrOXYzine (ATARAX) 25 MG tablet Take 25-50 mg by mouth at bedtime.     ? levothyroxine (SYNTHROID) 300 MCG tablet Take 300 mcg by mouth daily before breakfast.     ? metoprolol succinate (TOPROL-XL) 25 MG 24 hr tablet Take 25 mg by mouth daily.     ? Multiple Vitamin (MULTIVITAMIN WITH MINERALS) TABS tablet Take 1 tablet by mouth daily.     ? Pediatric Multivitamins-Iron (FLINTSTONES PLUS IRON PO) Take 1 tablet by mouth daily.     ? promethazine (PHENERGAN) 12.5 MG tablet Take 12.5 mg by mouth 2 (two) times daily as needed for nausea or vomiting.     ? amantadine (SYMMETREL) 100 MG capsule Take 100 mg by mouth 2 (two) times daily.     ? cetirizine (ZYRTEC) 10 MG tablet Take 10 mg by mouth daily.     ?  diazepam (VALIUM) 5 MG tablet SMARTSIG:1-2 Tablet(s) By Mouth PRN     ? divalproex (DEPAKOTE ER) 500 MG 24 hr tablet Take 500 mg by mouth 2 (two) times daily.      ? DULoxetine (CYMBALTA) 60 MG capsule Take 60 mg by mouth 2 (two) times daily.      ? montelukast (SINGULAIR) 10 MG tablet Take 10 mg by mouth daily.     ? ? ?Patient Stressors: Marital or family conflict   ?Medication change or noncompliance   ?Other: new puppy   ? ?Patient Strengths: Capable of independent living  ?Financial means  ?Motivation for treatment/growth  ?Supportive family/friends  ? ?Treatment Modalities: Medication Management, Group therapy, Case management,  ?1 to 1 session with clinician, Psychoeducation, Recreational therapy. ? ? ?Physician Treatment Plan for Primary Diagnosis: Bipolar 2 disorder, major depressive episode (Lindsey Nicholson) ?Long Term Goal(s):    ? ?Short Term  Goals:   ? ?Medication Management: Evaluate patient's response, side effects, and tolerance of medication regimen. ? ?Therapeutic Interventions: 1 to 1 sessions, Unit Group sessions and Medication administration. ? ?Evaluation of Outcomes: Not Met ? ?Physician Treatment Plan for Secondary Diagnosis: Principal Problem: ?  Bipolar 2 disorder, major depressive episode (Lindsey Nicholson) ?Active Problems: ?  PTSD (post-traumatic stress disorder) ?  GAD (generalized anxiety disorder) ?  Borderline personality disorder (Lindsey Plena) ? ?Long Term Goal(s):    ? ?Short Term Goals:      ? ?Medication Management: Evaluate patient's response, side effects, and tolerance of medication regimen. ? ?Therapeutic Interventions: 1 to 1 sessions, Unit Group sessions and Medication administration. ? ?Evaluation of Outcomes: Not Met ? ? ?RN Treatment Plan for Primary Diagnosis: Bipolar 2 disorder, major depressive episode (Lindsey Nicholson) ?Long Term Goal(s): Knowledge of disease and therapeutic regimen to maintain health will improve ? ?Short Term Goals: Ability to remain free from injury will improve, Ability to participate in decision making will improve, Ability to verbalize feelings will improve, Ability to disclose and discuss suicidal ideas, and Ability to identify and develop effective coping behaviors will improve ? ?Medication Management: RN will administer medications as ordered by provider, will assess and evaluate patient's response and provide education to patient for prescribed medication. RN will report any adverse and/or side effects to prescribing provider. ? ?Therapeutic Interventions: 1 on 1 counseling sessions, Psychoeducation, Medication administration, Evaluate responses to treatment, Monitor vital signs and CBGs as ordered, Perform/monitor CIWA, COWS, AIMS and Fall Risk screenings as ordered, Perform wound care treatments as ordered. ? ?Evaluation of Outcomes: Not Met ? ? ?LCSW Treatment Plan for Primary Diagnosis: Bipolar 2 disorder, major  depressive episode (Lindsey Nicholson) ?Long Term Goal(s): Safe transition to appropriate next level of care at discharge, Engage patient in therapeutic group addressing interpersonal concerns. ? ?Short Term Goals: Engage patient in aftercare planning with referrals and resources, Increase social support, Increase emotional regulation, Facilitate acceptance of mental health diagnosis and concerns, Identify triggers associated with mental health/substance abuse issues, and Increase skills for wellness and recovery ? ?Therapeutic Interventions: Assess for all discharge needs, 1 to 1 time with Education officer, museum, Explore available resources and support systems, Assess for adequacy in community support network, Educate family and significant other(s) on suicide prevention, Complete Psychosocial Assessment, Interpersonal group therapy. ? ?Evaluation of Outcomes: Not Met ? ? ?Progress in Treatment: ?Attending groups: Yes. ?Participating in groups: Yes. ?Taking medication as prescribed: Yes. ?Toleration medication: Yes. ?Family/Significant other contact made: No, will contact:  If consents are provided  ?Patient understands diagnosis: Yes. ?Discussing patient identified problems/goals with staff:  Yes. ?Medical problems stabilized or resolved: Yes. ?Denies suicidal/homicidal ideation: Yes. ?Issues/concerns per patient self-inventory: No. ? ? ?New problem(s) identified: No, Describe:  None  ? ?New Short Term/Long Term Goal(s): medication stabilization, elimination of SI thoughts, development of comprehensive mental wellness plan.  ? ?Patient Goals: "To not do self-harming behaviors and to not be irritable with other people"  ? ?Discharge Plan or Barriers: Patient recently admitted. CSW will continue to follow and assess for appropriate referrals and possible discharge planning.  ? ?Reason for Continuation of Hospitalization: Anxiety ?Depression ?Medication stabilization ?Suicidal ideation ? ?Estimated Length of Stay: 3 to 5 days  ? ?Last 3  [place] Suicide Severity Risk Score: ?[place] Admission (Current) from 07/09/2021 in [facility] ED from 07/08/2021 in [facility] ED from 2/14/

## 2021-07-10 NOTE — Progress Notes (Addendum)
[facility] MD Progress Note ?  ?07/10/21 9:27 AM ?Lindsey Nicholson ?MRN:  [id number] ?  ?Subjective:   ?Lindsey Nicholson is a 51 y.o. female with a history of borderline personality disorder, bipolar disorder, anxiety, physiologic tremor, and severe depression with recurrence, who was initially admitted for inpatient psychiatric hospitalization on 07/09/2021 for management of suicidal thoughts and feeling "overwhelmed, tired, and drowning" following family stressors. ? ?Yesterday, the psychiatry team made the following recommendations: ?-Change duloxetine from an alternating dose of 120 mg once daily on day 1 and 60 mg/day on day 2 , -> to 60 mg once daily for depressive symptoms in the context of bipolar 2 disorder  ?-Continue home hydroxyzine 50 mg nightly scheduled for insomnia ?-Continue outpatient taper of Depakote ER 1000 mg nightly for 2 more doses, then decrease to 500 mg nightly, then stop ?-Start Lumateperone 42 mg for bipolar disorder ?-Continue amantadine 100 mg twice daily for now.  Patient unsure of indication for this medication.  -We might attempt to decrease/taper this medication during hospitalization.   ?-Husband to bring in promethazine as needed ? ?On exam today, the pt continued to report depressed mood and feeling irritable. She reports feeling fatigued this morning and again on assessment in the afternoon.  ?Pt states she woke up a few times last night but fell back asleep quickly. Pt noted to nod off a few times during the interview this morning. Pt reports her appetite is fine but the food here "doesn't flow with my dietary restrictions." Pt states to her gastric surgery, she often eats high protein, low carb diets with lots of V8 juice, trail mix, grilled items, and no bread or fried foods. Her mood is "tired." Her concentration is poor due to her fatigue.  ? ?Pt reports talking to her husband on the phone yesterday. She states she is interested in going to groups but knows another person on the hall  and is not comfortable attending or sharing in front of that pt. Pt denies auditory and visual hallucinations. She denies having suicidal thoughts.  Denies HI. ? ?Pt reports the following side effects to her psychiatric medications: fatigue, grogginess. Pt did not get out of bed for morning medications stating she was "too tired." Pt reports she can't seem to get up and is feeling still in her hips, attributing this to her osteoarthritis.  ? ? ?Principal Problem: Bipolar 2 disorder, major depressive episode (HCC) ?Diagnosis:  Principal Problem: ?  Bipolar 2 disorder, major depressive episode (HCC) ?Active Problems: ?  PTSD (post-traumatic stress disorder) ?  GAD (generalized anxiety disorder) ?  Borderline personality disorder (HCC) ?  ?Past Psychiatric Hx: ?Previous Psych Diagnoses: bipolar disorder, PTSD, severe depression, borderline Personality Disorder ?Prior inpatient treatment: multiple hospitalizations ?History of suicide: multiple attempts previously ?Psychiatric medication history: bupropion (stopped 3-4 weeks ago), duloxetine, Depakote ?Psychiatric medication compliance history: compliant ?Current Psychiatrist:Dr. Milagros Evener ?Current therapist: Johnny Bridge ? ?Past Medical History: ?Past Medical History:  ?Diagnosis Date  ? Anxiety   ? Bipolar 1 disorder (HCC)   ? Borderline personality disorder (HCC)   ? Borderline personality disorder (HCC)   ? Cervical dysplasia   ? GERD (gastroesophageal reflux disease)   ? History of vulvar dysplasia   ? vin I  --- s/p  wide excision 06-21-2012   ? Hypothyroidism   ? Moderate major depression (HCC)   ? Morbid obesity (HCC)   ? NAFL (nonalcoholic fatty liver)   ? Pre-diabetes   ? Premature ovarian failure   ? Wears  glasses   ? ?Past Surgical History:  ?Procedure Laterality Date  ? CARPAL TUNNEL RELEASE Bilateral LEFT  2013//  RIGHT  JAN  2015  ? CHOLECYSTECTOMY  2011  ? DILATATION & CURETTAGE/HYSTEROSCOPY WITH MYOSURE N/A 05/04/2018  ? Procedure: DILATATION &  CURETTAGE/HYSTEROSCOPY WITH MYOSURE  POLYPECTOMY AND FAILED NOVASURE ABLATION;  Surgeon: Richarda Overlie, MD;  Location: [facility];  Service: Gynecology;  Laterality: N/A;  ? HYSTEROSCOPY WITH D & C  10/17/2010  ? Procedure: DILATATION AND CURETTAGE (D&C) /HYSTEROSCOPY;  Surgeon: Jessee Avers;  Location: [facility];  Service: Gynecology;  Laterality: N/A;  ? HYSTEROSCOPY WITH D & C N/A 06/21/2012  ? Procedure: DILATATION AND CURETTAGE /HYSTEROSCOPY;  Surgeon: [name], MD;  Location: [facility];  Service: Gynecology;  Laterality: N/A;  ? KNEE ARTHROSCOPY WITH MEDIAL MENISECTOMY Left 04/15/2017  ? Procedure: Left knee arthroscopic partial medial, lateral and partial menisectomies with lateral release;  Surgeon: Yolonda Kida, MD;  Location: [facility];  Service: Orthopedics;  Laterality: Left;  ? LAPAROSCOPIC GASTRIC BAND REMOVAL WITH LAPAROSCOPIC GASTRIC SLEEVE RESECTION N/A   ? LAPAROSCOPIC TUBAL LIGATION  01/09/2011  ? Procedure: LAPAROSCOPIC TUBAL LIGATION;  Surgeon: [name];  Location: [facility];  Service: Gynecology;  Laterality: Bilateral;  with [place] clips  ? LEEP  01/09/2011  ? Procedure: LOOP ELECTROSURGICAL EXCISION PROCEDURE (LEEP);  Surgeon: [name];  Location: [facility];  Service: Gynecology;  Laterality: N/A;  ? LEEP N/A 09/16/2013  ? Procedure: LOOP ELECTROSURGICAL EXCISION PROCEDURE (LEEP);  Surgeon: [name], MD;  Location: [facility];  Service: Gynecology;  Laterality: N/A;  ? NEGATIVE SLEEP STUDY  2014  ? per pt  ? ORIF LEFT FOOT FX  2008  ? HARDWARE  REMOVED 6 MONTHS LATER  ? STOMACH SURGERY  OBERA GASTRIC BALLON JULY 2018  ? VULVECTOMY N/A 06/21/2012  ? Procedure: WIDE EXCISION VULVECTOMY;  Surgeon: [name], MD;  Location: [facility];  Service: Gynecology;  Laterality: N/A;  excision of vulvar lesions,  need colposcope  ? ?Allergies:  ?Allergies  ?Allergen Reactions  ? Codeine Anaphylaxis  ? Hydrocodone Anaphylaxis  ? Oxycodone Anaphylaxis and  Other (See Comments)  ?  Percocet  ? Trazodone And Nefazodone Other (See Comments)  ?  Blurry vision  ? ?Family History: ?Family History  ?Problem Relation Age of Onset  ? Cancer Mother   ? Mental illness Mother   ? Alcohol abuse Mother   ? Drug abuse Mother   ? Bipolar disorder Sister   ? Alcohol abuse Maternal Aunt   ? Drug abuse Maternal Aunt   ? Depression Maternal Aunt   ? Schizophrenia Maternal Aunt   ? Alcohol abuse Maternal Uncle   ? Alcohol abuse Maternal Grandfather   ? Diabetes Father   ?  ?Family Psychiatric History: ?-Dissociative personality disorder - mother ?-Bipolar disorder - sister ?-Substance abuse - mother, aunt, uncle, grandfather ?-Depression - maternal aunt ?-Schizophrenia - maternal aunt ? ?Social History: ?Social History  ? ?Socioeconomic History  ? Marital status: Married  ?  Spouse name: Not on file  ? Number of children: Not on file  ? Years of education: Not on file  ? Highest education level: 12th grade  ?Occupational History  ? Not on file  ?Tobacco Use  ? Smoking status: Every Day  ?  Packs/day: 0.50  ?  Years: 25.00  ?  Pack years: 12.50  ?  Types: Cigarettes  ?  Passive exposure: Current  ? Smokeless  tobacco: Never  ?Vaping Use  ? Vaping Use: Never used  ?Substance and Sexual Activity  ? Alcohol use: No  ? Drug use: No  ? Sexual activity: Yes  ?  Birth control/protection: Surgical  ?Other Topics Concern  ? Not on file  ?Social History Narrative  ? Not on file  ? ?Social Determinants of Health  ? ?Financial Resource Strain: Not on file  ?Food Insecurity: Not on file  ?Transportation Needs: Not on file  ?Physical Activity: Not on file  ?Stress: Not on file  ?Social Connections: Not on file  ?Intimate Partner Violence: Not on file  ? ?Current Medications: ?Current Facility-Administered Medications:  ?  acetaminophen (TYLENOL) tablet 650 mg, 650 mg, Oral, Q6H PRN, Gayle Martinez, MD, 650 mg at 07/10/21 0509 ?  alum & mag hydroxide-simeth (MAALOX/MYLANTA) 200-200-20 MG/5ML  suspension 30 mL, 30 mL, Oral, Q4H PRN, Oneta RackLewis, Tanika N, NP ?  amantadine (SYMMETREL) capsule 100 mg, 100 mg, Oral, BID, Cassie Henkels, MD, 100 mg at 07/09/21 1825 ?  divalproex (DEPAKOTE ER) 24 hr tablet 1,000 m

## 2021-07-10 NOTE — Progress Notes (Signed)
?   07/10/21 0900  ?Psych Admission Type (Psych Patients Only)  ?Admission Status Voluntary  ?Psychosocial Assessment  ?Patient Complaints Worrying;Anxiety  ?Eye Contact Fair  ?Facial Expression Sullen  ?Affect Sad  ?Speech Slow;Soft  ?Interaction Avoidant  ?Motor Activity Slow  ?Appearance/Hygiene Unremarkable  ?Behavior Characteristics Unwilling to participate  ?Mood Anxious  ?Thought Process  ?Coherency Circumstantial  ?Content Blaming self  ?Delusions None reported or observed  ?Perception WDL  ?Hallucination None reported or observed  ?Judgment Limited  ?Confusion None  ?Danger to Self  ?Current suicidal ideation? Passive  ?Self-Injurious Behavior No self-injurious ideation or behavior indicators observed or expressed   ?Agreement Not to Harm Self Yes  ?Description of Agreement verbal  ?Danger to Others  ?Danger to Others None reported or observed  ? ? ?

## 2021-07-10 NOTE — Progress Notes (Signed)
?   07/09/21 2108  ?Psych Admission Type (Psych Patients Only)  ?Admission Status Voluntary  ?Psychosocial Assessment  ?Patient Complaints Agitation;Worrying;Anxiety  ?Eye Contact Fair  ?Facial Expression Anxious;Animated;Worried  ?Affect Appropriate to circumstance;Anxious  ?Speech Logical/coherent  ?Interaction Assertive  ?Motor Activity Slow  ?Appearance/Hygiene Unremarkable  ?Behavior Characteristics Cooperative;Appropriate to situation  ?Mood Anxious;Pleasant  ?Thought Process  ?Coherency Concrete thinking  ?Content Blaming self  ?Delusions None reported or observed  ?Perception WDL  ?Hallucination None reported or observed  ?Judgment Limited  ?Confusion None  ?Danger to Self  ?Current suicidal ideation? Passive  ?Self-Injurious Behavior No self-injurious ideation or behavior indicators observed or expressed   ?Agreement Not to Harm Self Yes  ?Description of Agreement Verbal Contract  ?Danger to Others  ?Danger to Others None reported or observed  ? ? ?

## 2021-07-11 DIAGNOSIS — F319 Bipolar disorder, unspecified: Secondary | ICD-10-CM | POA: Diagnosis not present

## 2021-07-11 MED ORDER — HYDROCERIN EX CREA
TOPICAL_CREAM | Freq: Two times a day (BID) | CUTANEOUS | Status: DC
  Filled 2021-07-11: qty 113

## 2021-07-11 NOTE — [facility] Suicide Risk Assessment (Signed)
[facility] INPATIENT:  Family/Significant Other Suicide Prevention Education ? ?Suicide Prevention Education:  ?Education Completed; Lindsey Nicholson,  [phone] (name of family member/significant other) has been identified by the patient as the family member/significant other with whom the patient will be residing, and identified as the person(s) who will aid the patient in the event of a mental health crisis (suicidal ideations/suicide attempt).  With written consent from the patient, the family member/significant other has been provided the following suicide prevention education, prior to the and/or following the discharge of the patient. ? ?CSW spoke with patient husband for background information. Per husband patient has been more irritable with mood swings.  She has been recently been dealing with problems with her birth mother and the state recently took guardianship of her.  This has been really stressing patient out and triggered outbursts. Patient also just got a 3rd dog which has been stressful and recently she lost it with the animals. Lindsey Nicholson has been discussing that she has felt overwhelmed and doesn't feel like she has been acting like herself. Patient will be returning home. Husband does not have any safety concerns. He has an antique gun for show that he believes doesn't work but he agreed to have a family member keep it for the time being.  ? ?The suicide prevention education provided includes the following: ?Suicide risk factors ?Suicide prevention and interventions ?National Suicide Hotline telephone number ?[facility] assessment telephone number ?[facility] Emergency Assistance 911 ?[place] and/or Residential Mobile Crisis Unit telephone number ? ?Request made of family/significant other to: ?Remove weapons (e.g., guns, rifles, knives), all items previously/currently identified as safety concern.   ?Remove drugs/medications (over-the-counter, prescriptions, illicit drugs), all  items previously/currently identified as a safety concern. ? ?The family member/significant other verbalizes understanding of the suicide prevention education information provided.  The family member/significant other agrees to remove the items of safety concern listed above. ? ?Lindsey Nicholson E Lindsey Nicholson ?07/11/2021, 1:34 PM ?

## 2021-07-11 NOTE — Progress Notes (Signed)
?   07/10/21 2103  ?Psych Admission Type (Psych Patients Only)  ?Admission Status Voluntary  ?Psychosocial Assessment  ?Patient Complaints Worrying;Anxiety  ?Eye Contact Fair  ?Facial Expression Anxious;Animated  ?Affect Anxious  ?Speech Logical/coherent  ?Interaction Assertive  ?Motor Activity Slow  ?Appearance/Hygiene Unremarkable  ?Behavior Characteristics Cooperative;Appropriate to situation  ?Mood Anxious  ?Thought Process  ?Coherency Circumstantial  ?Content Blaming self  ?Delusions None reported or observed  ?Perception WDL  ?Hallucination None reported or observed  ?Judgment Limited  ?Confusion None  ?Danger to Self  ?Current suicidal ideation? Passive  ?Self-Injurious Behavior No self-injurious ideation or behavior indicators observed or expressed   ?Agreement Not to Harm Self Yes  ?Description of Agreement Verbal contract  ?Danger to Others  ?Danger to Others None reported or observed  ? ? ?

## 2021-07-11 NOTE — Plan of Care (Signed)

## 2021-07-11 NOTE — [facility] Group Notes (Signed)
Adult Psychoeducational Group Note ? ?Date:  07/11/2021 ?Time:  8:59 PM ? ?Group Topic/Focus:  ?Wrap-Up Group:   The focus of this group is to help patients review their daily goal of treatment and discuss progress on daily workbooks. ? ?Participation Level:  Active ? ?Participation Quality:  Appropriate ? ?Affect:  Appropriate ? ?Cognitive:  Appropriate ? ?Insight: Appropriate ? ?Engagement in Group:  Engaged ? ?Modes of Intervention:  Activity ? ?Additional Comments:  Pt attended the evening wrap-up group. Tech introduced the staff for the evening, reminded group of the evening schedule and reminded them to ask for anything they need. Pt participated in progressive muscle relaxation to explore how tension and stress is held in the body. The activity also allowed the pt to work on their ability to Counsellor. Pt was given the opportunity to express how it felt to work on progressive muscle relaxation. Group ended with a reminder of the rest of the evening schedule. ? ?Osa Craver ?07/11/2021, 8:59 PM ?

## 2021-07-11 NOTE — Progress Notes (Addendum)
[facility] MD Progress Note ?  ?07/11/21 10:44 AM ?Loyce Dys ?MRN:  [id number] ?  ?Subjective:   ?Lindsey Nicholson is a 51 y.o. female with a history of borderline personality disorder, bipolar disorder, anxiety, essential tremor, and severe depression with recurrence, who was initially admitted for inpatient psychiatric hospitalization on 07/09/2021 for management of suicidal thoughts and feeling "overwhelmed, tired, and drowning" following family stressors. ? ?Yesterday the Psychiatry Team made the following recommendations: ?-Continue duloxetine 60 mg once daily for depressive symptoms in the context of bipolar 2 disorder  ?-Continue home hydroxyzine 50 mg nightly scheduled for insomnia ?-Continue outpatient taper of Depakote ER 1000 mg nightly for 1 more dose (last dose 4-12), then decrease to 500 mg nightly for 3 doses, then stop ?-Continue Lumateperone 42 mg for bipolar disorder ?-Decrease amantadine from 100 mg twice daily to 100 mg once daily for 3 doses, then stop.  ? ?On exam today, the pt continued to report depressed mood and feeling irritable. She reports feeling fatigued this morning, stating she was in and out of sleep last night due to the safety team keeping her door open. She states she did not fall back asleep quickly. ?Pt reports her appetite is fine but the food here does not align well with her dietary restrictions. Pt states to her gastric surgery, she often eats high protein, low carb diets with lots of V8 juice, trail mix, grilled items, and no bread or fried foods. She states her husband has been visiting every evening, getting her trail mix from the vending machine. Her mood is "I don't know. I am just...like...here." Her concentration is poor due to her fatigue, but noted to be improved since yesterday. Eye contact noted to be improved during today's interview. ? ?Pt states she is went to one group yesterday but knows another person on the hall and is not comfortable sharing details on why she  is here in group for fear it will be shared outside of [facility] by this patient. Pt denies auditory and visual hallucinations. She denies having suicidal thoughts.  Denies HI. ? ?Pt denies side effects to her psychiatric medications. Pt did not get out of bed for morning medications stating she was "in too much pain." Pt reports she can't seem to get up and is feeling still in her hips, attributing this to her osteoarthritis. Pt reports her hip pain is a 20/10 pain. States she has a pillow at home that she sleeps with between her legs. Pt encouraged to attempt moving around a little more today to decrease arthritic pain. Pt asked if it was ok that her husband brought this. Additionally, pt reports skin irritation/dryness around the left eye and is requesting Eucerin cream. ? ? ?Principal Problem: Bipolar 2 disorder, major depressive episode ([place]) ?Diagnosis:  Principal Problem: ?  Bipolar 2 disorder, major depressive episode ([place]) ?Active Problems: ?  PTSD (post-traumatic stress disorder) ?  GAD (generalized anxiety disorder) ?  Borderline personality disorder ([place]) ?  ?Past Psychiatric Hx: ?Previous Psych Diagnoses: bipolar disorder, PTSD, borderline Personality Disorder ?Prior inpatient treatment: multiple hospitalizations ?History of suicide: multiple attempts previously ?Psychiatric medication history: bupropion (stopped 3-4 weeks ago), duloxetine, Depakote ?Psychiatric medication compliance history: compliant ?Current Psychiatrist:Dr. Chucky May ?Current therapist: Lauris Poag ? ?Past Medical History: ?Past Medical History:  ?Diagnosis Date  ? Anxiety   ? Bipolar 1 disorder ([place])   ? Borderline personality disorder ([place])   ? Borderline personality disorder ([place])   ? Cervical dysplasia   ? GERD (gastroesophageal  reflux disease)   ? History of vulvar dysplasia   ? vin I  --- s/p  wide excision 06-21-2012   ? Hypothyroidism   ? Moderate major depression ([place])   ? Morbid obesity ([place])   ? NAFL (nonalcoholic fatty  liver)   ? Pre-diabetes   ? Premature ovarian failure   ? Wears glasses   ? ?Past Surgical History:  ?Procedure Laterality Date  ? CARPAL TUNNEL RELEASE Bilateral LEFT  2013//  RIGHT  JAN  2015  ? CHOLECYSTECTOMY  2011  ? DILATATION & CURETTAGE/HYSTEROSCOPY WITH MYOSURE N/A 05/04/2018  ? Procedure: DILATATION & CURETTAGE/HYSTEROSCOPY WITH MYOSURE  POLYPECTOMY AND FAILED NOVASURE ABLATION;  Surgeon: Molli Posey, MD;  Location: [facility];  Service: Gynecology;  Laterality: N/A;  ? HYSTEROSCOPY WITH D & C  10/17/2010  ? Procedure: DILATATION AND CURETTAGE (D&C) /HYSTEROSCOPY;  Surgeon: Catha Brow;  Location: [facility];  Service: Gynecology;  Laterality: N/A;  ? HYSTEROSCOPY WITH D & C N/A 06/21/2012  ? Procedure: DILATATION AND CURETTAGE /HYSTEROSCOPY;  Surgeon: Margarette Asal, MD;  Location: [facility];  Service: Gynecology;  Laterality: N/A;  ? KNEE ARTHROSCOPY WITH MEDIAL MENISECTOMY Left 04/15/2017  ? Procedure: Left knee arthroscopic partial medial, lateral and partial menisectomies with lateral release;  Surgeon: Nicholes Stairs, MD;  Location: [facility];  Service: Orthopedics;  Laterality: Left;  ? LAPAROSCOPIC GASTRIC BAND REMOVAL WITH LAPAROSCOPIC GASTRIC SLEEVE RESECTION N/A   ? LAPAROSCOPIC TUBAL LIGATION  01/09/2011  ? Procedure: LAPAROSCOPIC TUBAL LIGATION;  Surgeon: Margarette Asal;  Location: [facility];  Service: Gynecology;  Laterality: Bilateral;  with [place] clips  ? LEEP  01/09/2011  ? Procedure: LOOP ELECTROSURGICAL EXCISION PROCEDURE (LEEP);  Surgeon: Margarette Asal;  Location: [facility];  Service: Gynecology;  Laterality: N/A;  ? LEEP N/A 09/16/2013  ? Procedure: LOOP ELECTROSURGICAL EXCISION PROCEDURE (LEEP);  Surgeon: Margarette Asal, MD;  Location: [facility];  Service: Gynecology;  Laterality: N/A;  ? NEGATIVE SLEEP STUDY  2014  ? per pt  ? ORIF LEFT FOOT FX  2008  ? HARDWARE  REMOVED 6 MONTHS LATER  ? Jay GASTRIC B[place] JULY 2018  ? VULVECTOMY N/A  06/21/2012  ? Procedure: WIDE EXCISION VULVECTOMY;  Surgeon: Margarette Asal, MD;  Location: [facility];  Service: Gynecology;  Laterality: N/A;  excision of vulvar lesions,  need colposcope  ? ?Allergies:  ?Allergies  ?Allergen Reactions  ? Codeine Anaphylaxis  ? Hydrocodone Anaphylaxis  ? Oxycodone Anaphylaxis and Other (See Comments)  ?  Percocet  ? Trazodone And Nefazodone Other (See Comments)  ?  Blurry vision  ? ?Family History: ?Family History  ?Problem Relation Age of Onset  ? Cancer Mother   ? Mental illness Mother   ? Alcohol abuse Mother   ? Drug abuse Mother   ? Bipolar disorder Sister   ? Alcohol abuse Maternal Aunt   ? Drug abuse Maternal Aunt   ? Depression Maternal Aunt   ? Schizophrenia Maternal Aunt   ? Alcohol abuse Maternal Uncle   ? Alcohol abuse Maternal Grandfather   ? Diabetes Father   ?  ?Family Psychiatric History: ?-Dissociative personality disorder - mother ?-Bipolar disorder - sister ?-Substance abuse - mother, aunt, uncle, grandfather ?-Depression - maternal aunt ?-Schizophrenia - maternal aunt ? ?Social History: ?Social History  ? ?Socioeconomic History  ? Marital status: Married  ?  Spouse name: Not on file  ? Number of children: Not on file  ? Years  of education: Not on file  ? Highest education level: 12th grade  ?Occupational History  ? Not on file  ?Tobacco Use  ? Smoking status: Every Day  ?  Packs/day: 0.50  ?  Years: 25.00  ?  Pack years: 12.50  ?  Types: Cigarettes  ?  Passive exposure: Current  ? Smokeless tobacco: Never  ?Vaping Use  ? Vaping Use: Never used  ?Substance and Sexual Activity  ? Alcohol use: No  ? Drug use: No  ? Sexual activity: Yes  ?  Birth control/protection: Surgical  ?Other Topics Concern  ? Not on file  ?Social History Narrative  ? Not on file  ? ?Social Determinants of Health  ? ?Financial Resource Strain: Not on file  ?Food Insecurity: Not on file  ?Transportation Needs: Not on file  ?Physical Activity: Not on file  ?Stress: Not on file  ?Social  Connections: Not on file  ?Intimate Partner Violence: Not on file  ? ?Current Medications: ?Current Facility-Administered Medications:  ?  acetaminophen (TYLENOL) tablet 650 mg, 650 mg, Oral, Q6H PRN, Belinda Schlichting, Na

## 2021-07-12 DIAGNOSIS — F319 Bipolar disorder, unspecified: Secondary | ICD-10-CM | POA: Diagnosis not present

## 2021-07-12 MED ORDER — HYDROXYZINE HCL 25 MG PO TABS: 25.0000 mg | ORAL_TABLET | Freq: Three times a day (TID) | ORAL | 0 refills | Status: AC | PRN

## 2021-07-12 MED ORDER — LEVOTHYROXINE SODIUM 125 MCG PO TABS: 250.0000 ug | ORAL_TABLET | Freq: Every day | ORAL | 0 refills | Status: DC

## 2021-07-12 MED ORDER — NICOTINE 21 MG/24HR TD PT24: 21.0000 mg | MEDICATED_PATCH | Freq: Every day | TRANSDERMAL | 0 refills | Status: DC

## 2021-07-12 MED ORDER — HYDROXYZINE HCL 50 MG PO TABS: 50.0000 mg | ORAL_TABLET | Freq: Every day | ORAL | 0 refills | Status: AC

## 2021-07-12 MED ORDER — DIVALPROEX SODIUM ER 500 MG PO TB24: 500.0000 mg | ORAL_TABLET | Freq: Every day | ORAL | 0 refills | Status: DC

## 2021-07-12 MED ORDER — DULOXETINE HCL 60 MG PO CPEP: 60.0000 mg | ORAL_CAPSULE | Freq: Every day | ORAL | Status: AC

## 2021-07-12 MED ORDER — LUMATEPERONE TOSYLATE 42 MG PO CAPS
42.0000 mg | ORAL_CAPSULE | Freq: Every day | ORAL | Status: AC
Start: 2021-07-12 — End: ?

## 2021-07-12 NOTE — Discharge Instructions (Signed)
Special medication instructions: ?-Stop amantadine ?-Depakote DR 500 mg once at bedtime - take 2 more doses, then STOP ?-Levothyroxine dose is changed to 250 mcg once daily ?-Cymbalta dose is changed to 60 mg once daily  ? ?Discharge Instructions:  ?-Follow-up with your outpatient psychiatric provider -instructions on appointment date, time, and address (location) are provided to you in discharge paperwork. ? ?-Take your psychiatric medications as prescribed at discharge - instructions are provided to you in the discharge paperwork (additional instructions above) ? ?-Follow-up with outpatient primary care doctor and other specialists -for management of chronic medical disease ? ?-Recommend abstinence from alcohol, tobacco, and other illicit drug use at discharge.  ? ?-If your psychiatric symptoms recur, worsen, or if you have side effects to your psychiatric medications, call your outpatient psychiatric provider, 911, [phone] or go to the nearest emergency department. ? ?-If suicidal thoughts recur, call your outpatient psychiatric provider, 911, [phone] or go to the nearest emergency department. ? ?

## 2021-07-12 NOTE — [facility] Suicide Risk Assessment (Addendum)
Suicide Risk Assessment ? ?Discharge Assessment    ?[facility] ? ? ?Principal Problem: Bipolar 2 disorder, major depressive episode (HCC) ?Discharge Diagnoses: Principal Problem: ?  Bipolar 2 disorder, major depressive episode (HCC) ?Active Problems: ?  PTSD (post-traumatic stress disorder) ?  GAD (generalized anxiety disorder) ?  Borderline personality disorder (HCC) ? ? ?Total Time spent with patient: 30 minutes ?Lindsey Nicholson is a 51 y.o. female with a hx of borderline personality disorder, bipolar disorder, anxiety, physiologic tremor, and severe depression with recurrence [facility] Su[facility] and feeling "overwhelmed, tired, and drowning."   ?HOSPITAL COURSE: ? ?During the patient's hospitalization, patient had extensive initial psychiatric evaluation, and follow-up psychiatric evaluations every day. ? ?Psychiatric diagnoses provided upon initial assessment:  ?Bipolar disorder, type II, current episode is depressed ?GAD ?PTSD ?Borderline Personality Disorder ?  ? ?Patient's psychiatric medications were adjusted on admission:  ?-Changed duloxetine from an alternating dose of 120 mg once daily on day 1 and 60 mg/day on day 2 , -> to 60 mg once daily for depressive symptoms in the context of bipolar 2 disorder  ?-Continued home hydroxyzine 50 mg nightly scheduled for insomnia ?-Continued outpatient taper of Depakote ER 1000 mg nightly for 2 more doses, then decrease to 500 mg nightly, then stop ?-Started Lumateperone 42 mg for bipolar disorder ?-Continued amantadine 100 mg twice daily for now.  Patient unsure of indication for this medication. Patient does not have parkinsonian symptoms or history of Parkinson disease diagnosis. ? ?During the hospitalization, other adjustments were made to the patient's psychiatric medication regimen:  ?Continued duloxetine 60 mg once daily for depressive symptoms in the context of bipolar 2 disorder  ?-Continued home  hydroxyzine 50 mg nightly scheduled for insomnia ?-Decreased outpatient taper of Depakote ER from 1000 mg nightly to 500 mg nightly for 3 doses, then stop ?-Continued Lumateperone 42 mg for bipolar disorder ?-Decreased amantadine from 100 mg twice daily to 100 mg once daily for 2 doses, then stop.  ? ?Also, levothyroxine reduced from 300 mcg to 25 mcg once daily - for hypothyroidism due to low TSH  ?Patient's care was discussed during the interdisciplinary team meeting every day during the hospitalization. ? ?The patient denies having side effects to prescribed psychiatric medication. ? ?Gradually, patient started adjusting to milieu. The patient was evaluated each day by a clinical provider to ascertain response to treatment. Improvement was noted by the patient's report of decreasing symptoms, improved sleep and appetite, affect, medication tolerance, behavior, and participation in unit programming.  Patient was asked each day to complete a self inventory noting mood, mental status, pain, new symptoms, anxiety and concerns.   ?Symptoms were reported as significantly decreased or resolved completely by discharge.  ?The patient reports that their mood is stable.  ?The patient denied having suicidal thoughts for more than 48 hours prior to discharge.  Patient denies having homicidal thoughts.  Patient denies having auditory hallucinations.  Patient denies any visual hallucinations or other symptoms of psychosis.  ?The patient was motivated to continue taking medication with a goal of continued improvement in mental health.  ? ?The patient reports their target psychiatric symptoms of Bipolar depression, PTSD and GAD responded well to the psychiatric medications, and the patient reports overall benefit other psychiatric hospitalization. Supportive psychotherapy was provided to the patient. The patient also participated in regular group therapy while hospitalized. Coping skills, problem solving as well as relaxation  therapies were also part of the unit programming. ? ?  Labs were reviewed with the patient, and abnormal results were discussed with the patient.Triglycerides 250, HDL 29, VLDL 50 ?TSH 0.204, Wbc 3.5 ? ?The patient is able to verbalize their individual safety plan to this provider. ? ?# It is recommended to the patient to continue psychiatric medications as prescribed, after discharge from the hospital.   ? ?# It is recommended to the patient to follow up with your outpatient psychiatric provider and PCP  ?#  It is recommended to the patient to follow up with your outpatient Endocrinologist for thyroid dysfunction and dermatologist for skin rash. ?# It was discussed with the patient, the impact of alcohol, drugs, tobacco have been there overall psychiatric and medical wellbeing, and total abstinence from substance use was recommended the patient.ed. ? ?# Prescriptions provided or sent directly to preferred pharmacy at discharge. Patient agreeable to plan. Given opportunity to ask questions. Appears to feel comfortable with discharge.  ?  ?# In the event of worsening symptoms, the patient is instructed to call the crisis hotline, 911 and or go to the nearest ED for appropriate evaluation and treatment of symptoms. To follow-up with primary care provider for other medical issues, concerns and or health care needs. ? ?#Patient seen by this MD. At time of discharge, consistently refuted any suicidal ideation, intention or plan, denies any Self harm urges. Denies any A/VH and no delusions were elicited and does not seem to be responding to internal stimuli. During assessment the patient is able to verbalize appropriated coping skills and safety plan to use on return home. Patient verbalizes intent to be compliant with medication and outpatient services.  ? ?# Patient was discharged home with a plan to follow up as noted below.  ?Musculoskeletal: ?Strength & Muscle Tone: within normal limits ?Gait & Station: normal ?Patient  leans: N/A ? ?Psychiatric Specialty Exam ? ?Presentation  ?General Appearance: Appropriate for Environment; Casual; Well Groomed ? ?Eye Contact:Good ? ?Speech:Clear and Coherent; Normal Rate ? ?Speech Volume:Normal ? ?Handedness:No data recorded ? ?Mood and Affect  ?Mood:Euthymic ? ?Duration of Depression Symptoms: Greater than two weeks ? ?Affect:Appropriate; Congruent; Full Range ? ? ?Thought Process  ?Thought Processes:Linear ? ?Descriptions of Associations:Intact ? ?Orientation:Full (Time, Place and Person) ? ?Thought Content:Logical ? ?History of Schizophrenia/Schizoaffective disorder:No data recorded ?Duration of Psychotic Symptoms:No data recorded ?Hallucinations:Hallucinations: None ? ?Ideas of Reference:None ? ?Suicidal Thoughts:Suicidal Thoughts: No ? ?Homicidal Thoughts:Homicidal Thoughts: No ? ? ?Sensorium  ?Memory:Immediate Good; Recent Good; Remote Good ? ?Judgment:Good ? ?Insight:Good ? ? ?Executive Functions  ?Concentration:Good ? ?Attention Span:Good ? ?Recall:Good ? ?Fund of Knowledge:Good ? ?Language:Good ? ? ?Psychomotor Activity  ?Psychomotor Activity:Psychomotor Activity: Normal ? ? ?Assets  ?Assets:No data recorded ? ?Sleep  ?Sleep:Sleep: Good ? ? ?Physical Exam: ?Physical Exam see D/c summary ?ROS see d/c summary ? ?Blood pressure 108/61, pulse (!) 106, temperature 98 ?F (36.7 ?C), temperature source Oral, resp. rate 20, height 5\' 4"  (1.626 m), weight 105.4 kg, SpO2 99 %. Body mass index is 39.89 kg/m?. ? ?Mental Status Per Nursing Assessment::   ?On Admission:  Suicidal ideation indicated by patient ?Demographic factors:  NA ?Current Mental Status: Mood stable, No SI, HI, AVH ?Loss Factors:  NA ?Historical Factors:  Prior suicide attempts ?Risk Reduction Factors:  Living with another person, especially a relative, Positive social support ? ?Continued Clinical Symptoms:  ?Dysthymia ?Chronic Pain ?Previous Psychiatric Diagnoses and Treatments ? ?Cognitive Features That Contribute To Risk:   ?Closed-mindedness and Thought constriction (tunnel vision)   ? ?Suicide Risk:  ?Mild:  There are no identifiable suicide plans, no associated intent, mild dysphoria and related symptoms, good self-control (

## 2021-07-12 NOTE — Progress Notes (Addendum)
D:  Patient denied SI and HI, contracts for safety.  Denied A/V hallucinations.  Stated it hurt to get out of bed.  Bilateral hip pain             #6.  Stated she was in and out of sleep during the night.  ?A:  Medications administrated per MD order.   ?Emotional support and encouragement given patient. ?R:  Safety maintained with 15 minute checks. ? ?

## 2021-07-12 NOTE — Group Note (Signed)
Recreation Therapy Group Note ? ? ?Group Topic:Team Building  ?Group Date: 07/12/2021 ?Start Time: 0930 ?End Time: 7902 ?Facilitators: Caroll Rancher, LRT,CTRS ?Location: [address] Dayroom ? ? ?Goal Area(s) Addresses:  ?Patient will effectively work with peer towards shared goal.  ?Patient will identify skills used to make activity successful.  ?Patient will identify how skills used during activity can be applied to reach post d/c goals.  ? ?Group Description: Tallest [employer]. In teams of 5-6, patients were given 25 small craft pipe cleaners. Using the materials provided, patients were instructed to compete again the opposing team(s) to build the tallest free-standing structure from floor level. The activity was timed; difficulty increased by Clinical research associate as Production designer, theatre/television/film continued.  Systematically resources were removed with additional directions for example, placing one arm behind their back, working in silence, and shape stipulations. LRT facilitated post-activity discussion reviewing team processes and necessary communication skills involved in completion. Patients were encouraged to reflect how the skills utilized, or not utilized, in this activity can be incorporated to positively impact support systems post discharge. ? ? ?Affect/Mood: Appropriate ?  ?Participation Level: Engaged ?  ?Participation Quality: Independent ?  ?Behavior: Appropriate ?  ?Speech/Thought Process: Focused ?  ?Insight: Good ?  ?Judgement: Good ?  ?Modes of Intervention: Competitive Play ?  ?Patient Response to Interventions:  Attentive ?  ?Education Outcome: ? Acknowledges education and In group clarification offered   ? ?Clinical Observations/Individualized Feedback: Pt came in late to group.  Pt was able to help peers with the last few pieces of their structure.  Pt was appropriate during group.  ? ? ?Plan: Continue to engage patient in RT group sessions 2-3x/week. ? ? ?Caroll Rancher, LRT,CTRS  ?07/12/2021 11:27 AM ?

## 2021-07-12 NOTE — Plan of Care (Signed)
Nurse discussed anxiety, depression and anxiety with patient.  

## 2021-07-12 NOTE — Progress Notes (Signed)
?   07/12/21 0000  ?Psych Admission Type (Psych Patients Only)  ?Admission Status Voluntary  ?Psychosocial Assessment  ?Patient Complaints Anxiety  ?Eye Contact Fair  ?Facial Expression Animated  ?Affect Appropriate to circumstance  ?Speech Logical/coherent  ?Interaction Assertive  ?Motor Activity Slow  ?Appearance/Hygiene Improved  ?Behavior Characteristics Appropriate to situation  ?Mood Anxious  ?Thought Process  ?Coherency WDL  ?Content WDL  ?Delusions None reported or observed  ?Perception WDL  ?Hallucination None reported or observed  ?Judgment Poor  ?Confusion None  ?Danger to Self  ?Current suicidal ideation? Denies  ?Self-Injurious Behavior No self-injurious ideation or behavior indicators observed or expressed   ?Agreement Not to Harm Self Yes  ?Description of Agreement verbal  ?Danger to Others  ?Danger to Others None reported or observed  ? ? ?

## 2021-07-12 NOTE — Progress Notes (Signed)
?  [facility] Adult Case Management Discharge Plan : ? ?Will you be returning to the same living situation after discharge:  Yes,  back to live with Husband in [facility] ?At discharge, do you have transportation home?: Yes,  Husband to pick patient up ?Do you have the ability to pay for your medications: Yes,  insurance ? ?Release of information consent forms completed and in the chart;  Patient's signature needed at discharge. ? ?Patient to Follow up at: ? Follow-up Information   ? ? Milagros Evener, MD Follow up on 07/27/2021.   ?Specialty: Psychiatry ?Why: You have an appointment for medication management services on Saturday, July 27, 2021 at 5:15 pm.  This will be a Virtual appointment. ?Contact information: ?[address] ?[address] ?[place] [address] ?[phone] ? ? ?  ?  ? ? Meredith Leeds, Counselor Follow up on 08/05/2021.   ?Why: You have an appointment for therapy services on 08/05/21 at 2:00 pm. ?Contact information: ?[address], [place], [place] [address] ? ?Phone: [phone] ? ?  ?  ? ?  ?  ? ?  ? ? ?Next level of care provider has access to [facility]:no ? ?Safety Planning and Suicide Prevention discussed: Yes,  Husband ? ?  ? ?Has patient been referred to the Quitline?: Patient refused referral, patient provided brochure of quitline to follow up if needed at a later time ? ?Patient has been referred for addiction treatment: N/A ? ?Lindsey Nicholson [name], LCSW ?07/12/2021, 10:30 AM ?

## 2021-07-12 NOTE — Discharge Summary (Signed)
Physician Discharge Summary Note ? ?Patient:  Lindsey Nicholson is an 51 y.o., female ?MRN:  [id number] ?DOB:  Aug 27, 1970 ?Patient phone:  [phone] (home)  ?Patient address:   ?[address] ?[place] [address],  ?Total Time spent with patient: 30 minutes ? ?Date of Admission:  07/09/2021 ?Date of Discharge: 07/12/21 ? ?Reason for Admission:  Lindsey Nicholson is a 51 y.o. female with a hx of borderline personality disorder, bipolar disorder, anxiety, physiologic tremor, and severe depression with recurrence who voluntarily presents to [facility] for Suicidal thoughts and feeling "overwhelmed, tired, and drowning."   ? ?Principal Problem: Bipolar 2 disorder, major depressive episode (HCC) ?Discharge Diagnoses: Principal Problem: ?  Bipolar 2 disorder, major depressive episode (HCC) ?Active Problems: ?  PTSD (post-traumatic stress disorder) ?  GAD (generalized anxiety disorder) ?  Borderline personality disorder (HCC) ? ? ?Past Psychiatric History: Previous Psych Diagnoses: bipolar disorder, PTSD, severe depression, borderline Personality Disorder ?Prior inpatient treatment: multiple hospitalizations ?History of suicide: multiple attempts previously ?Psychiatric medication history: bupropion (stopped 3-4 weeks ago), duloxetine, Depakote ?Psychiatric medication compliance history: compliant ?Current Psychiatrist:Dr. Milagros Evener ?Current therapist: Johnny Bridge ? ?Past Medical History:  ?Past Medical History:  ?Diagnosis Date  ? Anxiety   ? Bipolar 1 disorder (HCC)   ? Borderline personality disorder (HCC)   ? Borderline personality disorder (HCC)   ? Cervical dysplasia   ? GERD (gastroesophageal reflux disease)   ? History of vulvar dysplasia   ? vin I  --- s/p  wide excision 06-21-2012   ? Hypothyroidism   ? Moderate major depression (HCC)   ? Morbid obesity (HCC)   ? NAFL (nonalcoholic fatty liver)   ? Pre-diabetes   ? Premature ovarian failure   ? Wears glasses   ?  ?Past Surgical History:  ?Procedure Laterality  Date  ? CARPAL TUNNEL RELEASE Bilateral LEFT  2013//  RIGHT  JAN  2015  ? CHOLECYSTECTOMY  2011  ? DILATATION & CURETTAGE/HYSTEROSCOPY WITH MYOSURE N/A 05/04/2018  ? Procedure: DILATATION & CURETTAGE/HYSTEROSCOPY WITH MYOSURE  POLYPECTOMY AND FAILED NOVASURE ABLATION;  Surgeon: Richarda Overlie, MD;  Location: [facility];  Service: Gynecology;  Laterality: N/A;  ? HYSTEROSCOPY WITH D & C  10/17/2010  ? Procedure: DILATATION AND CURETTAGE (D&C) /HYSTEROSCOPY;  Surgeon: Jessee Avers;  Location: [facility];  Service: Gynecology;  Laterality: N/A;  ? HYSTEROSCOPY WITH D & C N/A 06/21/2012  ? Procedure: DILATATION AND CURETTAGE /HYSTEROSCOPY;  Surgeon: [name], MD;  Location: [facility];  Service: Gynecology;  Laterality: N/A;  ? KNEE ARTHROSCOPY WITH MEDIAL MENISECTOMY Left 04/15/2017  ? Procedure: Left knee arthroscopic partial medial, lateral and partial menisectomies with lateral release;  Surgeon: Yolonda Kida, MD;  Location: [facility];  Service: Orthopedics;  Laterality: Left;  ? LAPAROSCOPIC GASTRIC BAND REMOVAL WITH LAPAROSCOPIC GASTRIC SLEEVE RESECTION N/A   ? LAPAROSCOPIC TUBAL LIGATION  01/09/2011  ? Procedure: LAPAROSCOPIC TUBAL LIGATION;  Surgeon: [name];  Location: [facility];  Service: Gynecology;  Laterality: Bilateral;  with Filshie clips  ? LEEP  01/09/2011  ? Procedure: LOOP ELECTROSURGICAL EXCISION PROCEDURE (LEEP);  Surgeon: [name];  Location: [facility];  Service: Gynecology;  Laterality: N/A;  ? LEEP N/A 09/16/2013  ? Procedure: LOOP ELECTROSURGICAL EXCISION PROCEDURE (LEEP);  Surgeon: [name], MD;  Location: [facility];  Service: Gynecology;  Laterality: N/A;  ? NEGATIVE SLEEP STUDY  2014  ? per pt  ? ORIF LEFT FOOT FX  2008  ? HARDWARE  REMOVED 6 MONTHS LATER  ? STOMACH SURGERY  OBERA GASTRIC BALLON JULY 2018  ? VULVECTOMY N/A 06/21/2012  ? Procedure: WIDE EXCISION VULVECTOMY;  Surgeon: Meriel Picaichard M Holland, MD;  Location: [facility];  Service: Gynecology;   Laterality: N/A;  excision of vulvar lesions,  need colposcope  ? ?Family History:  ?Family History  ?Problem Relation Age of Onset  ? Cancer Mother   ? Mental illness Mother   ? Alcohol abuse Mother   ? Drug abuse Mother   ? Bipolar disorder Sister   ? Alcohol abuse Maternal Aunt   ? Drug abuse Maternal Aunt   ? Depression Maternal Aunt   ? Schizophrenia Maternal Aunt   ? Alcohol abuse Maternal Uncle   ? Alcohol abuse Maternal Grandfather   ? Diabetes Father   ? ?Family Psychiatric  History: Medical: none ?Psych: Mom - dissociative personality; Older sister - bipolar disorder  ?SA/HA: Maternal Grandmother - multiple suicide attempts ?Substance use family hx: mom - undisclosed drug use ?  ?Social History:  ?Social History  ? ?Substance and Sexual Activity  ?Alcohol Use No  ?   ?Social History  ? ?Substance and Sexual Activity  ?Drug Use No  ?  ?Social History  ? ?Socioeconomic History  ? Marital status: Married  ?  Spouse name: Not on file  ? Number of children: Not on file  ? Years of education: Not on file  ? Highest education level: 12th grade  ?Occupational History  ? Not on file  ?Tobacco Use  ? Smoking status: Every Day  ?  Packs/day: 0.50  ?  Years: 25.00  ?  Pack years: 12.50  ?  Types: Cigarettes  ?  Passive exposure: Current  ? Smokeless tobacco: Never  ?Vaping Use  ? Vaping Use: Never used  ?Substance and Sexual Activity  ? Alcohol use: No  ? Drug use: No  ? Sexual activity: Yes  ?  Birth control/protection: Surgical  ?Other Topics Concern  ? Not on file  ?Social History Narrative  ? Not on file  ? ?Social Determinants of Health  ? ?Financial Resource Strain: Not on file  ?Food Insecurity: Not on file  ?Transportation Needs: Not on file  ?Physical Activity: Not on file  ?Stress: Not on file  ?Social Connections: Not on file  ? ? ?Hospital Course:  Lindsey Nicholson is a 51 y.o. female with a hx of borderline personality disorder, bipolar disorder, anxiety, physiologic tremor, and severe depression with  recurrence who [facility] Suicidal thoughts and feeling "overwhelmed, tired, and drowning."   ?HOSPITAL COURSE: ?  ?During the patient's hospitalization, patient had extensive initial psychiatric evaluation, and follow-up psychiatric evaluations every day. ?  ?Psychiatric diagnoses provided upon initial assessment:  ?Bipolar disorder, type II, current episode is depressed ?GAD ?PTSD ?Borderline Personality Disorder ?  ?  ?Patient's psychiatric medications were adjusted on admission:  ?-Changed duloxetine from an alternating dose of 120 mg once daily on day 1 and 60 mg/day on day 2 , -> to 60 mg once daily for depressive symptoms in the context of bipolar 2 disorder  ?-Continued home hydroxyzine 50 mg nightly scheduled for insomnia ?-Continued outpatient taper of Depakote ER 1000 mg nightly for 2 more doses, then decrease to 500 mg nightly, then stop ?-Started Lumateperone 42 mg for bipolar disorder ?-Continued amantadine 100 mg twice daily for now.  Patient unsure of indication for this medication. Patient does not have parkinsonian symptoms or history of Parkinson disease diagnosis. ?  ?  During the hospitalization, other adjustments were made to the patient's psychiatric medication regimen:  ?Continued duloxetine 60 mg once daily for depressive symptoms in the context of bipolar 2 disorder  ?-Continued home hydroxyzine 50 mg nightly scheduled for insomnia ?-Decreased outpatient taper of Depakote ER from 1000 mg nightly to 500 mg nightly for 3 doses, then stop ?-Continued Lumateperone 42 mg for bipolar disorder ?-Decreased amantadine from 100 mg twice daily to 100 mg once daily for 2 doses, then stop.  ?  ?Also, levothyroxine reduced from 300 mcg to 25 mcg once daily - for hypothyroidism due to low TSH  ?Patient's care was discussed during the interdisciplinary team meeting every day during the hospitalization. ?  ?The patient denies having side effects to prescribed psychiatric medication. ?   ?Gradually, patient started adjusting to milieu. The patient was evaluated each day by a clinical provider to ascertain response to treatment. Improvement was noted by the patient's report of decreasing sympto

## 2021-07-12 NOTE — Plan of Care (Addendum)
Nurse discussed anxiety, depression and coping skills with patient.  Patient stated she is  ready to discharge. ? ?

## 2021-07-12 NOTE — Progress Notes (Signed)
Discharge Note:  Patient discharged home with family member.  Suicide prevention information given and discussed with patient who stated he understood and had no questions.  Denied SI and HI.  Denied A/V hallucinations.  Patient stated she received all her belongings, clothing, toiletries, bunny bag, knit and crochet utensils, etc.  Patient stated she appreciated all assistance received from [facility] staff.  All required discharge information given. ?

## 2021-07-18 ENCOUNTER — Encounter: Payer: Self-pay | Admitting: Cardiology

## 2021-07-22 ENCOUNTER — Institutional Professional Consult (permissible substitution): Payer: Medicare Other | Admitting: Cardiology

## 2021-07-22 NOTE — Progress Notes (Deleted)
Electrophysiology Office Note   Date:  07/22/2021   ID:  [name], DOB 06/08/1970, MRN [id number]  PCP:  Coralee Rud, PA-C  Cardiologist:  *** Primary Electrophysiologist: *** Kaden Dunkel Jorja Loa, MD    Chief Complaint: ***   History of Present Illness: Lindsey Nicholson is a 51 y.o. female who is being seen today for the evaluation of *** at the request of Coralee Rud, PA-C. Presenting today for electrophysiology evaluation.    Today, she denies*** symptoms of palpitations, chest pain, shortness of breath, orthopnea, PND, lower extremity edema, claudication, dizziness, presyncope, syncope, bleeding, or neurologic sequela. The patient is tolerating medications without difficulties.    Past Medical History:  Diagnosis Date   Anxiety    Bipolar 1 disorder (HCC)    Borderline personality disorder (HCC)    Borderline personality disorder (HCC)    Cervical dysplasia    GERD (gastroesophageal reflux disease)    History of vulvar dysplasia    vin I  --- s/p  wide excision 06-21-2012    Hypothyroidism    Moderate major depression (HCC)    Morbid obesity (HCC)    NAFL (nonalcoholic fatty liver)    Pre-diabetes    Premature ovarian failure    Wears glasses    Past Surgical History:  Procedure Laterality Date   CARPAL TUNNEL RELEASE Bilateral LEFT  2013//  RIGHT  JAN  2015   CHOLECYSTECTOMY  2011   DILATATION & CURETTAGE/HYSTEROSCOPY WITH MYOSURE N/A 05/04/2018   Procedure: DILATATION & CURETTAGE/HYSTEROSCOPY WITH MYOSURE  POLYPECTOMY AND FAILED NOVASURE ABLATION;  Surgeon: Richarda Overlie, MD;  Location: [facility];  Service: Gynecology;  Laterality: N/A;   HYSTEROSCOPY WITH D & C  10/17/2010   Procedure: DILATATION AND CURETTAGE (D&C) /HYSTEROSCOPY;  Surgeon: Jessee Avers;  Location: [facility];  Service: Gynecology;  Laterality: N/A;   HYSTEROSCOPY WITH D & C N/A 06/21/2012   Procedure: DILATATION AND CURETTAGE /HYSTEROSCOPY;  Surgeon: [name], MD;  Location:  [facility];  Service: Gynecology;  Laterality: N/A;   KNEE ARTHROSCOPY WITH MEDIAL MENISECTOMY Left 04/15/2017   Procedure: Left knee arthroscopic partial medial, lateral and partial menisectomies with lateral release;  Surgeon: Yolonda Kida, MD;  Location: [facility];  Service: Orthopedics;  Laterality: Left;   LAPAROSCOPIC GASTRIC BAND REMOVAL WITH LAPAROSCOPIC GASTRIC SLEEVE RESECTION N/A    LAPAROSCOPIC TUBAL LIGATION  01/09/2011   Procedure: LAPAROSCOPIC TUBAL LIGATION;  Surgeon: [name];  Location: [facility];  Service: Gynecology;  Laterality: Bilateral;  with Filshie clips   LEEP  01/09/2011   Procedure: LOOP ELECTROSURGICAL EXCISION PROCEDURE (LEEP);  Surgeon: [name];  Location: [facility];  Service: Gynecology;  Laterality: N/A;   LEEP N/A 09/16/2013   Procedure: LOOP ELECTROSURGICAL EXCISION PROCEDURE (LEEP);  Surgeon: [name], MD;  Location: [facility];  Service: Gynecology;  Laterality: N/A;   NEGATIVE SLEEP STUDY  2014   per pt   ORIF LEFT FOOT FX  2008   HARDWARE  REMOVED 6 MONTHS LATER   STOMACH SURGERY  OBERA GASTRIC BALLON JULY 2018   VULVECTOMY N/A 06/21/2012   Procedure: WIDE EXCISION VULVECTOMY;  Surgeon: [name], MD;  Location: [facility];  Service: Gynecology;  Laterality: N/A;  excision of vulvar lesions,  need colposcope     Current Outpatient Medications  Medication Sig Dispense Refill   cetirizine (ZYRTEC) 10 MG tablet Take 10 mg by mouth daily.     divalproex (DEPAKOTE ER) 500 MG 24  hr tablet Take 1 tablet (500 mg total) by mouth at bedtime for 2 doses. Take for 2 doses, then STOP 2 tablet 0   DULoxetine (CYMBALTA) 60 MG capsule Take 1 capsule (60 mg total) by mouth daily.     hydrOXYzine (ATARAX) 25 MG tablet Take 1 tablet (25 mg total) by mouth 3 (three) times daily as needed for anxiety. 30 tablet 0   hydrOXYzine (ATARAX) 50 MG tablet Take 1 tablet (50 mg total) by mouth at bedtime. 30 tablet 0   levothyroxine  (SYNTHROID) 125 MCG tablet Take 2 tablets (250 mcg total) by mouth daily at 6 (six) AM. 30 tablet 0   lumateperone tosylate (CAPLYTA) 42 MG capsule Take 1 capsule (42 mg total) by mouth at bedtime. 30 capsule    Multiple Vitamin (MULTIVITAMIN WITH MINERALS) TABS tablet Take 1 tablet by mouth daily.     nicotine (NICODERM CQ - DOSED IN MG/24 HOURS) 21 mg/24hr patch Place 1 patch (21 mg total) onto the skin daily. 28 patch 0   Pediatric Multivitamins-Iron (FLINTSTONES PLUS IRON PO) Take 1 tablet by mouth daily.     promethazine (PHENERGAN) 12.5 MG tablet Take 12.5 mg by mouth 2 (two) times daily as needed for nausea or vomiting.     No current facility-administered medications for this visit.    Allergies:   Codeine, Hydrocodone, Oxycodone, and Trazodone and nefazodone   Social History:  The patient  reports that she has been smoking cigarettes. She has a 12.50 pack-year smoking history. She has been exposed to tobacco smoke. She has never used smokeless tobacco. She reports that she does not drink alcohol and does not use drugs.   Family History:  The patient's ***family history includes Alcohol abuse in her maternal aunt, maternal grandfather, maternal uncle, and mother; Bipolar disorder in her sister; Cancer in her mother; Depression in her maternal aunt; Diabetes in her father; Drug abuse in her maternal aunt and mother; Heart disease in her mother; Hypertension in her mother; Mental illness in her mother; Schizophrenia in her maternal aunt.    ROS:  Please see the history of present illness.   Otherwise, review of systems is positive for none.   All other systems are reviewed and negative.    PHYSICAL EXAM: VS:  There were no vitals taken for this visit. , BMI There is no height or weight on file to calculate BMI. GEN: Well nourished, well developed, in no acute distress  HEENT: normal  Neck: no JVD, carotid bruits, or masses Cardiac: ***RRR; no murmurs, rubs, or gallops,no edema   Respiratory:  clear to auscultation bilaterally, normal work of breathing GI: soft, nontender, nondistended, + BS MS: no deformity or atrophy  Skin: warm and dry, ***device pocket is well healed Neuro:  Strength and sensation are intact Psych: euthymic mood, full affect  EKG:  EKG {ACTION; IS/IS [id number]} ordered today. Personal review of the ekg ordered *** shows ***  ***Device interrogation is reviewed today in detail.  See PaceArt for details.   Recent Labs: 07/08/2021: ALT 31; BUN 16; Creatinine, Ser 0.90; Hemoglobin 14.8; Platelets 154; Potassium 3.9; Sodium 139 07/10/2021: TSH 0.204    Lipid Panel     Component Value Date/Time   CHOL 158 07/10/2021 0628   TRIG 250 (H) 07/10/2021 0628   HDL 29 (L) 07/10/2021 0628   CHOLHDL 5.4 07/10/2021 0628   VLDL 50 (H) 07/10/2021 0628   LDLCALC 79 07/10/2021 0628     Wt Readings from Last 3 Encounters:  07/08/21 230 lb (104.3 kg)  05/14/21 226 lb (102.5 kg)  04/07/21 232 lb (105.2 kg)      Other studies Reviewed: Additional studies/ records that were reviewed today include: ***  Review of the above records today demonstrates: ***   ASSESSMENT AND PLAN:  1.  ***    Current medicines are reviewed at length with the patient today.   The patient {ACTIONS; HAS/DOES NOT HAVE:19233} concerns regarding her medicines.  The following changes were made today:  {NONE DEFAULTED:18576}  Labs/ tests ordered today include: *** No orders of the defined types were placed in this encounter.    Disposition:   FU with Daleon Willinger {gen number [id number]} {Days to years:10300}  Signed, Siddharth Babington Jorja LoaMartin Ebbie Sorenson, MD  07/22/2021 1:44 PM     [facility] [address] [place] [address] [phone] (office) [phone] (fax)

## 2021-08-05 ENCOUNTER — Encounter: Payer: Self-pay | Admitting: Internal Medicine

## 2022-01-07 ENCOUNTER — Encounter (HOSPITAL_BASED_OUTPATIENT_CLINIC_OR_DEPARTMENT_OTHER): Payer: Self-pay | Admitting: Emergency Medicine

## 2022-01-07 ENCOUNTER — Emergency Department (HOSPITAL_BASED_OUTPATIENT_CLINIC_OR_DEPARTMENT_OTHER)
Admission: EM | Admit: 2022-01-07 | Discharge: 2022-01-07 | Disposition: A | Discharge status: Home / Self Care | Payer: Medicare Other | Attending: Emergency Medicine | Admitting: Emergency Medicine

## 2022-01-07 ENCOUNTER — Other Ambulatory Visit: Payer: Self-pay

## 2022-01-07 DIAGNOSIS — L0591 Pilonidal cyst without abscess: Secondary | ICD-10-CM | POA: Diagnosis present

## 2022-01-07 MED ORDER — LIDOCAINE HCL (PF) 1 % IJ SOLN
5.0000 mL | Freq: Once | INTRAMUSCULAR | Status: AC
  Administered 2022-01-07: 5 mL via INTRADERMAL
  Filled 2022-01-07: qty 5

## 2022-01-07 NOTE — Discharge Instructions (Signed)
Today you were seen in the emergency department for your pilonidal cyst.    In the emergency department you had your cyst drained.    At home, please leave the packing in place until you are seen by a surgeon.    Check your MyChart online for the results of any tests that had not resulted by the time you left the emergency department.   Follow-up with your primary doctor in 2-3 days regarding your visit.    Return immediately to the emergency department if you experience any of the following: fevers, worsening pain, redness around the wound, or any other concerning symptoms.    Thank you for visiting our Emergency Department. It was a pleasure taking care of you today.

## 2022-01-07 NOTE — ED Triage Notes (Signed)
Reports hx of pilonidal cysts. Has one on bottom. Painful to sit. Has surgical consult tomorrow.

## 2022-01-07 NOTE — ED Provider Notes (Signed)
[facility] EMERGENCY DEPARTMENT Provider Note   CSN: [id number] Arrival date & time: 01/07/22  1406     History  Chief Complaint  Patient presents with   Cyst    Lindsey Nicholson is a 51 y.o. female.  51 year old female presents emergency department with concerns for cyst in her buttocks.  States that for several months she has noticed about in her gluteal cleft near her sacrum.  Says that over the past 2 to 3 days it became severely painful and now she is having difficulty sitting down.  Has tried Tylenol, baclofen, and cyclobenzaprine without improvement of her symptoms.  Denies any fevers.  No history of pilonidal cyst.  Says that her husband came into the emergency department today so she also would like to be seen.  Surgical consult is scheduled for her for tomorrow.        Home Medications Prior to Admission medications   Medication Sig Start Date End Date Taking? Authorizing Provider  cetirizine (ZYRTEC) 10 MG tablet Take 10 mg by mouth daily. 09/13/19   [provider]  divalproex (DEPAKOTE ER) 500 MG 24 hr tablet Take 1 tablet (500 mg total) by mouth at bedtime for 2 doses. Take for 2 doses, then STOP 07/12/21 07/14/21  Massengill, Ovid Curd, MD  DULoxetine (CYMBALTA) 60 MG capsule Take 1 capsule (60 mg total) by mouth daily. 07/13/21   Massengill, Ovid Curd, MD  hydrOXYzine (ATARAX) 25 MG tablet Take 1 tablet (25 mg total) by mouth 3 (three) times daily as needed for anxiety. 07/12/21   Massengill, Ovid Curd, MD  hydrOXYzine (ATARAX) 50 MG tablet Take 1 tablet (50 mg total) by mouth at bedtime. 07/12/21   Massengill, Ovid Curd, MD  levothyroxine (SYNTHROID) 125 MCG tablet Take 2 tablets (250 mcg total) by mouth daily at 6 (six) AM. 07/13/21 08/12/21  Massengill, Ovid Curd, MD  lumateperone tosylate (CAPLYTA) 42 MG capsule Take 1 capsule (42 mg total) by mouth at bedtime. 07/12/21   Massengill, Ovid Curd, MD  Multiple Vitamin (MULTIVITAMIN WITH MINERALS) TABS tablet Take 1 tablet  by mouth daily.    [provider]  nicotine (NICODERM CQ - DOSED IN MG/24 HOURS) 21 mg/24hr patch Place 1 patch (21 mg total) onto the skin daily. 07/13/21   Massengill, Ovid Curd, MD  Pediatric Multivitamins-Iron (FLINTSTONES PLUS IRON PO) Take 1 tablet by mouth daily.    [provider]  promethazine (PHENERGAN) 12.5 MG tablet Take 12.5 mg by mouth 2 (two) times daily as needed for nausea or vomiting.    [provider]      Allergies    Aripiprazole, Codeine, Hydrocodone, Oxycodone, Tramadol, Trazodone, Trazodone and nefazodone, and Nefazodone    Review of Systems   Review of Systems  Physical Exam Updated Vital Signs BP (!) 130/90 (BP Location: Right Arm)   Pulse 90   Temp 98.3 F (36.8 C) (Oral)   Resp 18   Ht 5\' 4"  (1.626 m)   Wt 103 kg   SpO2 99%   BMI 38.96 kg/m  Physical Exam Vitals and nursing note reviewed.  Constitutional:      General: She is not in acute distress.    Appearance: She is well-developed.  HENT:     Head: Normocephalic and atraumatic.     Right Ear: External ear normal.     Left Ear: External ear normal.     Nose: Nose normal.  Eyes:     Extraocular Movements: Extraocular movements intact.     Conjunctiva/sclera: Conjunctivae normal.  Pupils: Pupils are equal, round, and reactive to light.  Cardiovascular:     Rate and Rhythm: Normal rate and regular rhythm.     Heart sounds: No murmur heard. Pulmonary:     Effort: Pulmonary effort is normal. No respiratory distress.     Breath sounds: Normal breath sounds.  Abdominal:     General: Abdomen is flat.     Palpations: Abdomen is soft.  Genitourinary:    Comments: Pilonidal cyst palpated approximately 2.5 cm in diameter Musculoskeletal:        General: No swelling.     Cervical back: Normal range of motion and neck supple.     Right lower leg: No edema.     Left lower leg: No edema.  Skin:    General: Skin is warm and dry.     Capillary Refill: Capillary refill  takes less than 2 seconds.  Neurological:     Mental Status: She is alert and oriented to person, place, and time. Mental status is at baseline.  Psychiatric:        Mood and Affect: Mood normal.     ED Results / Procedures / Treatments   Labs (all labs ordered are listed, but only abnormal results are displayed) Labs Reviewed - No data to display  EKG None  Radiology No results found.  Procedures .Marland KitchenIncision and Drainage  Date/Time: 01/07/2022 4:04 PM  Performed by: Fransico Meadow, MD Authorized by: Fransico Meadow, MD   Location:    Type:  Pilonidal cyst   Size:  2.5 cm   Location:  Head Pre-procedure details:    Skin preparation:  Chlorhexidine Sedation:    Sedation type:  None Anesthesia:    Anesthesia method:  Local infiltration   Local anesthetic:  Lidocaine 1% w/o epi Procedure type:    Complexity:  Simple Procedure details:    Ultrasound guidance: yes     Needle aspiration: no     Incision types:  Single straight   Wound management:  Probed and deloculated   Drainage:  Bloody and purulent   Drainage amount:  Moderate   Wound treatment:  Wound left open   Packing materials:  1/2 in iodoform gauze   Amount 1/2" iodoform:  8 Post-procedure details:    Procedure completion:  Tolerated well, no immediate complications    Medications Ordered in ED Medications  lidocaine (PF) (XYLOCAINE) 1 % injection 5 mL (5 mLs Intradermal Given by Other 01/07/22 1502)    ED Course/ Medical Decision Making/ A&P                           Medical Decision Making Risk Prescription drug management.   Lindsey Nicholson is a 51 y.o. female who presents with chief complaint of concerns for pilonidal cyst.  This patient presents to the ED for concern of complaints listed in HPI, this involves an extensive number of treatment options, and is a complaint that carries with it a high risk of complications and morbidity.   Initial Ddx:  Pilonidal cyst, infected cyst,  tumor  MDM:  Feel the patient likely had a pilonidal cyst on exam.  No overlying signs of infection.  Will perform incision and drainage and packing and have the patient follow-up with surgery tomorrow.  Plan:  Incision and drainage  ED Summary:  Ultrasound showed cystic structure without blood flow and incision and drainage performed with moderate expression of purulent material.  Wound was packed and  patient was instructed to follow-up with her surgeon tomorrow.  Dispo: DC Home. Return precautions discussed including, but not limited to, those listed in the AVS. Allowed pt time to ask questions which were answered fully prior to dc.   Records reviewed Care Everywhere I personally reviewed and interpreted the pt's EKG: see above for interpretation  I have reviewed the patients home medications and made adjustments as needed  Final Clinical Impression(s) / ED Diagnoses Final diagnoses:  Pilonidal cyst    Rx / DC Orders ED Discharge Orders     None         Fransico Meadow, MD 01/07/22 1606

## 2022-01-17 ENCOUNTER — Ambulatory Visit (INDEPENDENT_AMBULATORY_CARE_PROVIDER_SITE_OTHER): Payer: Medicare Other | Admitting: Obstetrics and Gynecology

## 2022-01-17 ENCOUNTER — Encounter: Payer: Self-pay | Admitting: Obstetrics and Gynecology

## 2022-01-17 VITALS — BP 124/86 | HR 97 | Ht 63.5 in | Wt 233.0 lb

## 2022-01-17 DIAGNOSIS — R350 Frequency of micturition: Secondary | ICD-10-CM | POA: Diagnosis not present

## 2022-01-17 DIAGNOSIS — N3281 Overactive bladder: Secondary | ICD-10-CM | POA: Diagnosis not present

## 2022-01-17 DIAGNOSIS — M62838 Other muscle spasm: Secondary | ICD-10-CM

## 2022-01-17 DIAGNOSIS — N393 Stress incontinence (female) (male): Secondary | ICD-10-CM | POA: Diagnosis not present

## 2022-01-17 LAB — POCT URINALYSIS DIPSTICK
Bilirubin, UA: NEGATIVE
Glucose, UA: NEGATIVE
Ketones, UA: NEGATIVE
Leukocytes, UA: NEGATIVE
Nitrite, UA: NEGATIVE
Protein, UA: NEGATIVE
Spec Grav, UA: >=1.03 — AB (ref 1.010–1.025)
Urobilinogen, UA: 0.2 E.U./dL
pH, UA: 5.5 (ref 5.0–8.0)

## 2022-01-17 NOTE — Patient Instructions (Signed)
For treatment of stress urinary incontinence, which is leakage with physical activity/movement/strainging/coughing, we discussed expectant management versus nonsurgical options versus surgery. Nonsurgical options include weight loss, physical therapy, as well as a pessary.  Surgical options include a midurethral sling, which is a synthetic mesh sling that acts like a hammock under the urethra to prevent leakage of urine, a Burch urethropexy, and transurethral injection of a bulking agent.  We discussed the symptoms of overactive bladder (OAB), which include urinary urgency, urinary frequency, night-time urination, with or without urge incontinence.  We discussed management including behavioral therapy (decreasing bladder irritants by following a bladder diet, urge suppression strategies, timed voids, bladder retraining), physical therapy, medication; and for refractory cases posterior tibial nerve stimulation, sacral neuromodulation, and intravesical botulinum toxin injection.    URODYNAMICS (UDS) TEST INFORMATION  IMPORTANT: Please try to arrive with a comfortably full bladder!    What is UDS? Urodynamics is a bladder test used to evaluate how your bladder and urethra (tube you urinate out of) work to help find out the cause of your bladder symptoms and evaluate your bladder function in order to make the best treatment plan for you.   What to expect? A nurse will perform the test and will be with you during the entire exam. First we will have to empty your bladder on a special toilet.  After you have emptied your bladder, very small catheters (plastic tubing) will be placed into your bladder and into your vagina (or rectum). These special small catheters measure pressure to help measure your bladder function.  Your bladder will be gently filled with water and you will be asked to cough and strain at several different points during the test.   You will then be asked to empty your bladder in the  special toilet with the catheters in place. Most patients can urinate (pee) easily with the catheters in place since the catheters are so small. In total this procedure lasts about 45 minutes to 1 hour.  After your test is completed, you will return (or possibly be seen the same day) to review the results, talk about treatment options and make a plan moving forward.
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
## 2022-01-29 ENCOUNTER — Encounter: Payer: Medicare Other | Admitting: Obstetrics and Gynecology

## 2022-02-05 ENCOUNTER — Encounter: Payer: Self-pay | Admitting: Obstetrics and Gynecology

## 2022-02-05 ENCOUNTER — Ambulatory Visit (INDEPENDENT_AMBULATORY_CARE_PROVIDER_SITE_OTHER): Payer: Medicare Other | Admitting: Obstetrics and Gynecology

## 2022-02-05 DIAGNOSIS — N3281 Overactive bladder: Secondary | ICD-10-CM | POA: Diagnosis not present

## 2022-02-05 DIAGNOSIS — R350 Frequency of micturition: Secondary | ICD-10-CM | POA: Diagnosis not present

## 2022-02-05 DIAGNOSIS — N393 Stress incontinence (female) (male): Secondary | ICD-10-CM

## 2022-02-05 LAB — POCT URINALYSIS DIPSTICK
Bilirubin, UA: NEGATIVE
Blood, UA: NEGATIVE
Glucose, UA: NEGATIVE
Ketones, UA: NEGATIVE
Leukocytes, UA: NEGATIVE
Nitrite, UA: NEGATIVE
Protein, UA: NEGATIVE
Spec Grav, UA: >=1.03 — AB (ref 1.010–1.025)
Urobilinogen, UA: 0.2 E.U./dL
pH, UA: 5 (ref 5.0–8.0)

## 2022-02-05 NOTE — Progress Notes (Signed)
[facility] Urodynamics Procedure  Referring Physician: Boykin Reaper Date of Procedure: 02/05/2022  Lindsey Nicholson is a 51 y.o. female who presents for urodynamic evaluation. Indication(s) for study: SUI and OAB    Laboratory Results: A catheterized urine specimen revealed:  POC urine: Negative  Voiding Diary: Not completed  Procedure Timeout:  The correct patient was verified and the correct procedure was verified. The patient was in the correct position and safety precautions were reviewed based on at the patient's history.  Urodynamic Procedure A 10F dual lumen urodynamics catheter was placed under sterile conditions into the patient's bladder. A 10F catheter was placed into the rectum in order to measure abdominal pressure. EMG patches were placed in the appropriate position.  All connections were confirmed and calibrations/adjusted made. Saline was instilled into the bladder through the dual lumen catheters.  Cough/valsalva pressures were measured periodically during filling.  Patient was allowed to void.  The bladder was then emptied of its residual.  UROFLOW: Revealed a Qmax of 20 mL/sec.  She voided 92 mL and had a residual of 2 mL.  It was a interrupted pattern and represented normal habits though interpretation limited due to low voided volume.  CMG: This was performed with sterile water in the sitting position at a fill rate of 10-30 mL/min.    First sensation of fullness was 23 mLs,  First urge was 53 mLs,  Strong urge was 90 mLs and  Capacity was 202.4 mLs  Stress incontinence was demonstrated Highest negative CLPP was 199 cmH20 at 153 ml. Highest negative VLPP was 216 cmH20at 153 ml. Highest negative Barrier CLPP was 175 cmH20 at 153 ml. Lowest positive Barrier VLPP was 189 cmH20 at 153 ml.  Detrusor function was normal, with no phasic contractions seen.    Compliance:  Normal. End fill detrusor pressure was 0 cmH20.  Calculated compliance  was 269mL/cmH20  UPP: MUCP with barrier reduction was 101 cm of water.    MICTURITION STUDY: Voiding was performed with reduction using scopettes in the sitting position. Patient strained with voiding and catheter fell out at the beginning of void. Initial increase in Pdet was noted but unable to comment on maximum pressure.    Qmax was approximately 15 mL/sec.  It was a interrupted pattern.  She voided 202 mL and had a residual of 0 mL.  It was a volitional void, detrusor contraction was present and abdominal straining was present. Patient had pulled out the urethral catheter and stated she felt she could not go with it in as it was "blocking" her ability to pee.   EMG: This was performed with patches.  She had voluntary contractions, recruitment with fill was present and urethral sphincter was not relaxed with void.  The details of the procedure with the study tracings have been scanned into EPIC.   Urodynamic Impression:  1. Sensation was increased; capacity was reduced 2. Stress Incontinence was demonstrated at normal pressures in the standing position; 3. Detrusor Overactivity was not demonstrated with leakage. 4. Emptying was dysfunctional with a normal PVR, a detrusor contraction present (but due to catheter explusion, full Pdet not obtained during void),  abdominal straining present, dyssynergic urethral sphincter activity on EMG.  Plan: - The patient will follow up  to discuss the findings and treatment options.

## 2022-02-13 ENCOUNTER — Telehealth: Payer: Self-pay | Admitting: Obstetrics and Gynecology

## 2022-02-13 NOTE — Telephone Encounter (Signed)
Called patient to discuss PTNS option and how the procedure works. Patient reports understanding and would like to schedule. Plan to schedule mid-morning every Monday for 12 weeks. Sent a My Chart message with link that can be reviewed at her leisure.

## 2022-02-17 ENCOUNTER — Encounter: Payer: Self-pay | Admitting: Obstetrics and Gynecology

## 2022-02-17 ENCOUNTER — Ambulatory Visit: Payer: Medicare Other | Admitting: Obstetrics and Gynecology

## 2022-02-17 VITALS — BP 108/80 | HR 95

## 2022-02-17 DIAGNOSIS — N3281 Overactive bladder: Secondary | ICD-10-CM

## 2022-02-17 NOTE — Progress Notes (Signed)
[facility]  PTNS VISIT  CC:  Overactive bladder  51 y.o. with refractory overactive bladder who presents for percutaneous tibial nerve stimulation. The patient presents for PTNS session # 1.   Procedure:  Initially attempted to do the right ankle and the needle was in good position with the milliamps on 5. Patient rolled her ankle in the chair with the needle in place and reported severe pain. Attempted to bring the milliamps down to 1 and patient said the sensation was still too much. Needle was removed at that time.   The patient was placed in the sitting position and the left lower extremity was prepped in the usual fashion. The PTNS needle was then inserted at a 60 degree angle, 5 cm cephalad and 2 cm posterior to the medial malleolus. The PTNS unit was then programmed and an optimal response was noted at 6 milliamps. The PTNS stimulation was then performed at this setting for 26 minutes. She felt her leg was falling asleep and had gone numb and wanted to stop the session. The needle was removed and hemostasis was noted.   The pt will return in 1 week for PTNS session # 2.She wishes to try the right leg at the next visit as she feels the sensation was stronger and she will attempt to not rolle the ankle.   All questions were answered.

## 2022-02-19 ENCOUNTER — Emergency Department (HOSPITAL_BASED_OUTPATIENT_CLINIC_OR_DEPARTMENT_OTHER)
Admission: EM | Admit: 2022-02-19 | Discharge: 2022-02-19 | Disposition: A | Discharge status: Home / Self Care | Payer: Medicare Other | Attending: Emergency Medicine | Admitting: Emergency Medicine

## 2022-02-19 ENCOUNTER — Emergency Department (HOSPITAL_BASED_OUTPATIENT_CLINIC_OR_DEPARTMENT_OTHER): Payer: Medicare Other

## 2022-02-19 ENCOUNTER — Other Ambulatory Visit: Payer: Self-pay

## 2022-02-19 ENCOUNTER — Encounter (HOSPITAL_BASED_OUTPATIENT_CLINIC_OR_DEPARTMENT_OTHER): Payer: Self-pay | Admitting: Emergency Medicine

## 2022-02-19 DIAGNOSIS — E876 Hypokalemia: Secondary | ICD-10-CM | POA: Diagnosis not present

## 2022-02-19 DIAGNOSIS — R0789 Other chest pain: Secondary | ICD-10-CM | POA: Insufficient documentation

## 2022-02-19 DIAGNOSIS — R079 Chest pain, unspecified: Secondary | ICD-10-CM | POA: Diagnosis present

## 2022-02-19 HISTORY — DX: Hypokalemia: E87.6

## 2022-02-19 LAB — BASIC METABOLIC PANEL
Anion gap: 9 (ref 5–15)
BUN: 15 mg/dL (ref 6–20)
CO2: 23 mmol/L (ref 22–32)
Calcium: 8.3 mg/dL — ABNORMAL LOW (ref 8.9–10.3)
Chloride: 105 mmol/L (ref 98–111)
Creatinine, Ser: 0.67 mg/dL (ref 0.44–1.00)
GFR, Estimated: >60 mL/min (ref >60)
Glucose, Bld: 126 mg/dL — ABNORMAL HIGH (ref 70–99)
Potassium: 2.8 mmol/L — ABNORMAL LOW (ref 3.5–5.1)
Sodium: 137 mmol/L (ref 135–145)

## 2022-02-19 LAB — CBC
HCT: 36.7 % (ref 36.0–46.0)
Hemoglobin: 12.5 g/dL (ref 12.0–15.0)
MCH: 32 pg (ref 26.0–34.0)
MCHC: 34.1 g/dL (ref 30.0–36.0)
MCV: 93.9 fL (ref 80.0–100.0)
Platelets: 167 10*3/uL (ref 150–400)
RBC: 3.91 MIL/uL (ref 3.87–5.11)
RDW: 12.6 % (ref 11.5–15.5)
WBC: 4.2 10*3/uL (ref 4.0–10.5)
nRBC: 0 % (ref 0.0–0.2)

## 2022-02-19 LAB — TROPONIN I (HIGH SENSITIVITY): Troponin I (High Sensitivity): <2 ng/L (ref <18)

## 2022-02-19 MED ORDER — POTASSIUM CHLORIDE CRYS ER 20 MEQ PO TBCR
40.0000 meq | EXTENDED_RELEASE_TABLET | Freq: Once | ORAL | Status: AC
  Administered 2022-02-19: 40 meq via ORAL
  Filled 2022-02-19: qty 2

## 2022-02-19 MED ORDER — POTASSIUM CHLORIDE CRYS ER 20 MEQ PO TBCR: 20.0000 meq | EXTENDED_RELEASE_TABLET | Freq: Every day | ORAL | 0 refills | Status: DC

## 2022-02-19 NOTE — ED Triage Notes (Signed)
Pt states chest tight and uncomfortable central and to left X several weeks is worse tonight.  was seen by cardiologist and is supposed to see a specialist.

## 2022-02-19 NOTE — ED Provider Notes (Signed)
[facility] EMERGENCY DEPARTMENT Provider Note   CSN: [id number] Arrival date & time: 02/19/22  0251     History  Chief Complaint  Patient presents with   Chest Pain    Lindsey Nicholson is a 51 y.o. female.  The history is provided by the patient and medical records.  Chest Pain Lindsey Nicholson is a 51 y.o. female who presents to the Emergency Department complaining of chest pain.  She Zentz to the emergency department for evaluation of chest pain and sensation that her heart is beating hard and irregular.  She has associated tightness in her chest.  She has been getting episodes over the last several weeks that come and go and she is scheduled to follow-up with cardiology next week.  Today's episode lasted about 45 minutes.  No fever, sob, cough, ap, leg swelling/pain.   Prediabetes Tobacco No dvt/pe.     Home Medications Prior to Admission medications   Medication Sig Start Date End Date Taking? Authorizing Provider  potassium chloride SA (KLOR-CON M) 20 MEQ tablet Take 1 tablet (20 mEq total) by mouth daily. 02/19/22  Yes Quintella Reichert, MD  cetirizine (ZYRTEC) 10 MG tablet Take 10 mg by mouth daily. 09/13/19   [provider]  DULoxetine (CYMBALTA) 60 MG capsule Take 1 capsule (60 mg total) by mouth daily. 07/13/21   Massengill, Ovid Curd, MD  hydrOXYzine (ATARAX) 25 MG tablet Take 1 tablet (25 mg total) by mouth 3 (three) times daily as needed for anxiety. 07/12/21   Massengill, Ovid Curd, MD  hydrOXYzine (ATARAX) 50 MG tablet Take 1 tablet (50 mg total) by mouth at bedtime. 07/12/21   Massengill, Ovid Curd, MD  lumateperone tosylate (CAPLYTA) 42 MG capsule Take 1 capsule (42 mg total) by mouth at bedtime. 07/12/21   Massengill, Ovid Curd, MD  Multiple Vitamin (MULTIVITAMIN WITH MINERALS) TABS tablet Take 1 tablet by mouth daily.    [provider]  Pediatric Multivitamins-Iron (FLINTSTONES PLUS IRON PO) Take 1 tablet by mouth daily.    [provider]   promethazine (PHENERGAN) 12.5 MG tablet Take 12.5 mg by mouth 2 (two) times daily as needed for nausea or vomiting.    [provider]      Allergies    Aripiprazole, Codeine, Hydrocodone, Oxycodone, Tramadol, Trazodone, Trazodone and nefazodone, and Nefazodone    Review of Systems   Review of Systems  Cardiovascular:  Positive for chest pain.  All other systems reviewed and are negative.   Physical Exam Updated Vital Signs BP 115/79 (BP Location: Left Arm)   Pulse 84   Temp 98.2 F (36.8 C) (Oral)   Resp 18   Ht 5\' 3"  (1.6 m)   Wt 103.4 kg   SpO2 97%   BMI 40.39 kg/m  Physical Exam Vitals and nursing note reviewed.  Constitutional:      Appearance: She is well-developed.  HENT:     Head: Normocephalic and atraumatic.  Cardiovascular:     Rate and Rhythm: Normal rate and regular rhythm.     Heart sounds: No murmur heard. Pulmonary:     Effort: Pulmonary effort is normal. No respiratory distress.     Breath sounds: Normal breath sounds.  Chest:     Chest wall: Tenderness present.  Abdominal:     Palpations: Abdomen is soft.     Tenderness: There is no abdominal tenderness. There is no guarding or rebound.  Musculoskeletal:        General: No swelling or tenderness.  Skin:  General: Skin is warm and dry.  Neurological:     Mental Status: She is alert and oriented to person, place, and time.  Psychiatric:        Behavior: Behavior normal.     ED Results / Procedures / Treatments   Labs (all labs ordered are listed, but only abnormal results are displayed) Labs Reviewed  BASIC METABOLIC PANEL - Abnormal; Notable for the following components:      Result Value   Potassium 2.8 (*)    Glucose, Bld 126 (*)    Calcium 8.3 (*)    All other components within normal limits  CBC  TROPONIN I (HIGH SENSITIVITY)  TROPONIN I (HIGH SENSITIVITY)    EKG EKG Interpretation  Date/Time:  Wednesday February 19 2022 03:04:13 EST Ventricular Rate:  83 PR  Interval:  160 QRS Duration: 88 QT Interval:  372 QTC Calculation: 437 R Axis:   -7 Text Interpretation: Normal sinus rhythm Nonspecific T wave abnormality Abnormal ECG No significant change since last tracing Confirmed by Tilden Fossa [phone]) on 02/19/2022 5:06:34 AM  Radiology DG Chest 2 View  Result Date: 02/19/2022 CLINICAL DATA:  Chest pain EXAM: CHEST - 2 VIEW COMPARISON:  07/08/2021 FINDINGS: Cardiac and mediastinal contours are within normal limits. No focal pulmonary opacity. No pleural effusion or pneumothorax. No acute osseous abnormality. IMPRESSION: No acute cardiopulmonary process. Electronically Signed   By: Wiliam Ke M.D.   On: 02/19/2022 03:26    Procedures Procedures    Medications Ordered in ED Medications  potassium chloride SA (KLOR-CON M) CR tablet 40 mEq (has no administration in time range)    ED Course/ Medical Decision Making/ A&P                           Medical Decision Making Amount and/or Complexity of Data Reviewed Labs: ordered. Radiology: ordered.  Risk Prescription drug management.   Patient here for evaluation of palpitations with chest discomfort, has been seen previously for similar symptoms with plan for follow-up for echo next week.  EKG with nonspecific changes, similar when compared to priors.  Troponin is negative.  She does have hypokalemia-plan to replace orally and sent home on prescription.  Current clinical picture is not consistent with ACS, PE, dissection, pneumonia.  Chest x-ray is negative for acute infiltrate-images personally reviewed and interpreted, agree with radiologist interpretation.  Discussed outpatient follow-up.        Final Clinical Impression(s) / ED Diagnoses Final diagnoses:  Atypical chest pain  Hypokalemia    Rx / DC Orders ED Discharge Orders          Ordered    potassium chloride SA (KLOR-CON M) 20 MEQ tablet  Daily        02/19/22 0509              Tilden Fossa,  MD 02/19/22 [phone]

## 2022-02-24 ENCOUNTER — Ambulatory Visit (INDEPENDENT_AMBULATORY_CARE_PROVIDER_SITE_OTHER): Payer: Medicare Other | Admitting: Obstetrics and Gynecology

## 2022-02-24 ENCOUNTER — Encounter: Payer: Self-pay | Admitting: Obstetrics and Gynecology

## 2022-02-24 VITALS — BP 111/86 | HR 76

## 2022-02-24 DIAGNOSIS — N3281 Overactive bladder: Secondary | ICD-10-CM

## 2022-02-24 NOTE — Progress Notes (Signed)
[facility]  PTNS VISIT  CC:  Overactive bladder  51 y.o. with refractory overactive bladder who presents for percutaneous tibial nerve stimulation. The patient presents for PTNS session # 2.   Procedure: The patient was placed in the sitting position and the right lower extremity was prepped in the usual fashion. The PTNS needle was then inserted at a 60 degree angle, 5 cm cephalad and 2 cm posterior to the medial malleolus. The PTNS unit was then programmed and an optimal response was noted at 4 milliamps. The PTNS stimulation was then performed at this setting for 30 minutes without incident and the patient tolerated the procedure well. The needle was removed and hemostasis was noted.   The pt will return in 1 week for PTNS session # 3. All questions were answered.

## 2022-02-27 NOTE — Progress Notes (Signed)
[facility]  PTNS VISIT  CC:  Overactive bladder  51 y.o. with refractory overactive bladder who presents for percutaneous tibial nerve stimulation. The patient presents for PTNS session # 3.   Procedure: The patient was placed in the sitting position and the right lower extremity was prepped in the usual fashion. The PTNS needle was then inserted at a 60 degree angle, 5 cm cephalad and 2 cm posterior to the medial malleolus. The PTNS unit was then programmed and an optimal response was noted at 4 milliamps. The PTNS stimulation was then performed at this setting for 30 minutes without incident and the patient tolerated the procedure well. The needle was removed and hemostasis was noted.   The pt will return in 1 week for PTNS session # 4. All questions were answered.

## 2022-03-03 ENCOUNTER — Encounter: Payer: Self-pay | Admitting: Obstetrics and Gynecology

## 2022-03-03 ENCOUNTER — Ambulatory Visit (INDEPENDENT_AMBULATORY_CARE_PROVIDER_SITE_OTHER): Payer: Medicare Other | Admitting: Obstetrics and Gynecology

## 2022-03-03 VITALS — BP 110/78 | HR 70

## 2022-03-03 DIAGNOSIS — N3281 Overactive bladder: Secondary | ICD-10-CM | POA: Diagnosis not present

## 2022-03-10 ENCOUNTER — Encounter: Payer: Self-pay | Admitting: Obstetrics and Gynecology

## 2022-03-10 ENCOUNTER — Ambulatory Visit (INDEPENDENT_AMBULATORY_CARE_PROVIDER_SITE_OTHER): Payer: Medicare Other | Admitting: Obstetrics and Gynecology

## 2022-03-10 VITALS — BP 115/81 | HR 109

## 2022-03-10 DIAGNOSIS — N3281 Overactive bladder: Secondary | ICD-10-CM | POA: Diagnosis not present

## 2022-03-10 DIAGNOSIS — N393 Stress incontinence (female) (male): Secondary | ICD-10-CM | POA: Diagnosis not present

## 2022-03-10 NOTE — Progress Notes (Addendum)
[facility] Return Visit  SUBJECTIVE  History of Present Illness: Lindsey Nicholson is a 51 y.o. female seen in follow-up for mixed incontinence. She has been undergoing PTNS for OAB symptoms. She has also been decreasing her water intake (now drinks around 80oz) which has helped. Still having large leakage with cough.   Urodynamic Impression:  1. Sensation was increased; capacity was reduced 2. Stress Incontinence was demonstrated at normal pressures in the standing position; 3. Detrusor Overactivity was not demonstrated with leakage. 4. Emptying was dysfunctional with a normal PVR, a detrusor contraction present (but due to catheter explusion, full Pdet not obtained during void),  abdominal straining present, dyssynergic urethral sphincter activity on EMG.    Past Medical History: Patient  has a past medical history of Anxiety, Barrett esophagus, Bipolar 1 disorder (HCC), Borderline personality disorder (HCC), Borderline personality disorder (HCC), Carpal tunnel syndrome, Cervical dysplasia, Gastroparesis, GERD (gastroesophageal reflux disease), History of vulvar dysplasia, Hypothyroidism, Migraines, Moderate major depression (HCC), Morbid obesity (HCC), NAFL (nonalcoholic fatty liver), Pre-diabetes, Premature ovarian failure, PTSD (post-traumatic stress disorder), and Wears glasses.   Past Surgical History: She  has a past surgical history that includes Hysteroscopy with D & C (10/17/2010); LEEP (01/09/2011); Laparoscopic tubal ligation (01/09/2011); Hysteroscopy with D & C (N/A, 06/21/2012); Vulvectomy (N/A, 06/21/2012); Carpal tunnel release (Bilateral, LEFT  2013//  RIGHT  JAN  2015); Cholecystectomy (2011); ORIF LEFT FOOT FX (2008); NEGATIVE SLEEP STUDY (2014); LEEP (N/A, 09/16/2013); Stomach surgery (OBERA GASTRIC BALLON JULY 2018); Knee arthroscopy with medial menisectomy (Left, 04/15/2017); Dilatation & curettage/hysteroscopy with myosure (N/A, 05/04/2018); and Laparoscopic  gastric band removal with laparoscopic gastric sleeve resection (N/A).   Medications: She has a current medication list which includes the following prescription(s): cetirizine, duloxetine, hydroxyzine, hydroxyzine, lumateperone tosylate, multivitamin with minerals, pediatric multivitamins-iron, potassium chloride sa, and promethazine.   Allergies: Patient is allergic to aripiprazole, codeine, hydrocodone, oxycodone, tramadol, trazodone, trazodone and nefazodone, and nefazodone.   Social History: Patient  reports that she has been smoking cigarettes. She has a 12.50 pack-year smoking history. She has been exposed to tobacco smoke. She has never used smokeless tobacco. She reports that she does not drink alcohol and does not use drugs.      OBJECTIVE     Physical Exam: Vitals:   03/10/22 1028  BP: 115/81  Pulse: (!) 109   Gen: No apparent distress, A&O x 3.  Detailed Urogynecologic Evaluation:  Deferred. Prior exam showed:  POP-Q   -3                                            Aa   -3                                           Ba   -5                                              C    3  Gh   4                                            Pb   6                                            tvl    -2                                            Ap   -2                                            Bp   -5                                              D          ASSESSMENT AND PLAN    [name] is a 51 y.o. with:  1. SUI (stress urinary incontinence, female)   2. Overactive bladder    - Will continue with PTNS for OAB symptoms, session #4 today. - We discussed options of sling vs urethral bulking and she wants to proceed with a sling. We reviewed the option of the single incision sling to help reduce voiding dysfunction.  - We discussed that she should avoid straining to urinate as this can worsen voiding dysfunction, especially  after sling placement. Has previously self-catheterized and prefers to do this if she has retention after surgery (supplies provided).   - Plan for surgery: Exam under anesthesia, single incision sling, cystoscopy  - We reviewed the patient's specific anatomic and functional findings, with the assistance of diagrams, and together finalized the above procedure. The planned surgical procedures were discussed along with the surgical risks outlined below, which were also provided on a detailed handout. Additional treatment options including expectant management, conservative management, medical management were discussed where appropriate.  We reviewed the benefits and risks of each treatment option.   General Surgical Risks: For all procedures, there are risks of bleeding, infection, damage to surrounding organs including but not limited to bowel, bladder, blood vessels, ureters and nerves, and need for further surgery if an injury were to occur. These risks are all low with minimally invasive surgery.   There are risks of numbness and weakness at any body site or buttock/rectal pain.  It is possible that baseline pain can be worsened by surgery, either with or without mesh. If surgery is vaginal, there is also a low risk of possible conversion to laparoscopy or open abdominal incision where indicated. Very rare risks include blood transfusion, blood clot, heart attack, pneumonia, or death.   There is also a risk of short-term postoperative urinary retention with need to use a catheter. About half of patients need to go home from surgery with a catheter, which is then later removed in the office. The risk of long-term need for a  catheter is very low. There is also a risk of worsening of overactive bladder.   Sling: The effectiveness of a midurethral vaginal mesh sling is approximately 85%, and thus, there will be times when you may leak urine after surgery, especially if your bladder is full or if you have  a strong cough. There is a balance between making the sling tight enough to treat your leakage but not too tight so that you have long-term difficulty emptying your bladder. A mesh sling will not directly treat overactive bladder/urge incontinence and may worsen it.  There is an FDA safety notification on vaginal mesh procedures for prolapse but NOT mesh slings. We have extensive experience and training with mesh placement and we have close postoperative follow up to identify any potential complications from mesh. It is important to realize that this mesh is a permanent implant that cannot be easily removed. There are rare risks of mesh exposure (2-4%), pain with intercourse (0-7%), and infection (<1%). The risk of mesh exposure if more likely in a woman with risks for poor healing (prior radiation, poorly controlled diabetes, or immunocompromised). The risk of new or worsened chronic pain after mesh implant is more common in women with baseline chronic pain and/or poorly controlled anxiety or depression. Approximately 2-4% of patients will experience longer-term post-operative voiding dysfunction that may require surgical revision of the sling. We also reviewed that postoperatively, her stream may not be as strong as before surgery.  - For preop Visit:  She is required to have a visit within 30 days of her surgery.    - Medical clearance: not required  - Anticoagulant use: n/a - Medicaid Hysterectomy form: n/a - Accepts blood transfusion: Yes - Expected length of stay: outpatient  Request sent for surgery scheduling.   Marguerita Beards, MD

## 2022-03-10 NOTE — Progress Notes (Signed)
[facility]  PTNS VISIT  CC:  Overactive bladder  51 y.o. with refractory overactive bladder who presents for percutaneous tibial nerve stimulation. The patient presents for PTNS session # 4.   Procedure: The patient was placed in the sitting position and the right lower extremity was prepped in the usual fashion. The PTNS needle was then inserted at a 60 degree angle, 5 cm cephalad and 2 cm posterior to the medial malleolus. The PTNS unit was then programmed and an optimal response was noted at 5 milliamps. The PTNS stimulation was then performed at this setting for 30 minutes without incident and the patient tolerated the procedure well. The needle was removed and hemostasis was noted.   The pt will return in 1 week for PTNS session # 5. All questions were answered.

## 2022-03-17 ENCOUNTER — Ambulatory Visit (INDEPENDENT_AMBULATORY_CARE_PROVIDER_SITE_OTHER): Payer: Medicare Other | Admitting: Obstetrics and Gynecology

## 2022-03-17 ENCOUNTER — Encounter: Payer: Self-pay | Admitting: Obstetrics and Gynecology

## 2022-03-17 VITALS — BP 113/82 | HR 92

## 2022-03-17 DIAGNOSIS — N3281 Overactive bladder: Secondary | ICD-10-CM

## 2022-03-17 DIAGNOSIS — N393 Stress incontinence (female) (male): Secondary | ICD-10-CM

## 2022-03-17 NOTE — Patient Instructions (Signed)
Can try pumpkin extract gummies or tablets to help with overactive bladder, suggest 5g daily for overactive bladder but can go up to 10g.  We will not try any formal medications due to your other therapies.  Will not continue PTNS  Will plan for sling in January.

## 2022-03-17 NOTE — Progress Notes (Signed)
[facility] Return Visit  SUBJECTIVE  History of Present Illness: Lindsey Nicholson is a 51 y.o. female seen in follow-up for OAB. Plan at last visit was continue PTNS and plan for surgical sling.     Past Medical History: Patient  has a past medical history of Anxiety, Barrett esophagus, Bipolar 1 disorder (HCC), Borderline personality disorder (HCC), Borderline personality disorder (HCC), Carpal tunnel syndrome, Cervical dysplasia, Gastroparesis, GERD (gastroesophageal reflux disease), History of vulvar dysplasia, Hypothyroidism, Migraines, Moderate major depression (HCC), Morbid obesity (HCC), NAFL (nonalcoholic fatty liver), Pre-diabetes, Premature ovarian failure, PTSD (post-traumatic stress disorder), and Wears glasses.   Past Surgical History: She  has a past surgical history that includes Hysteroscopy with D & C (10/17/2010); LEEP (01/09/2011); Laparoscopic tubal ligation (01/09/2011); Hysteroscopy with D & C (N/A, 06/21/2012); Vulvectomy (N/A, 06/21/2012); Carpal tunnel release (Bilateral, LEFT  2013//  RIGHT  JAN  2015); Cholecystectomy (2011); ORIF LEFT FOOT FX (2008); NEGATIVE SLEEP STUDY (2014); LEEP (N/A, 09/16/2013); Stomach surgery (OBERA GASTRIC BALLON JULY 2018); Knee arthroscopy with medial menisectomy (Left, 04/15/2017); Dilatation & curettage/hysteroscopy with myosure (N/A, 05/04/2018); and Laparoscopic gastric band removal with laparoscopic gastric sleeve resection (N/A).   Medications: She has a current medication list which includes the following prescription(s): cetirizine, duloxetine, hydroxyzine, hydroxyzine, lumateperone tosylate, lurasidone, multivitamin with minerals, pediatric multivitamins-iron, potassium chloride sa, and promethazine.   Allergies: Patient is allergic to aripiprazole, codeine, hydrocodone, oxycodone, tramadol, trazodone, trazodone and nefazodone, and nefazodone.   Social History: Patient  reports that she has been smoking cigarettes.  She has a 12.50 pack-year smoking history. She has been exposed to tobacco smoke. She has never used smokeless tobacco. She reports that she does not drink alcohol and does not use drugs.      OBJECTIVE     Physical Exam: Vitals:   03/17/22 0952  BP: 113/82  Pulse: 92   Gen: No apparent distress, A&O x 3.  Detailed Urogynecologic Evaluation:  Deferred.      ASSESSMENT AND PLAN    Lindsey Nicholson is a 51 y.o. with:  1. Overactive bladder   2. SUI (stress urinary incontinence, female)   Patient does not wish to continue with PTNS at this time. She appears overstimulated when attempting to start the PTNS and reports she did not sleep well the last night. She was recently started on Latuda which she reports may be a contributing factor. She is also concerned about recurrent palpitations and the need for hip injections due to her OA. We discussed that we are attempting to help her OAB but if this is too much then we do not have to continue with PTNS. She stated "this is just too much" and we will not continue with PTNS. We did discuss that she can try pumpkin extract gummy or capsules to help with her overactive bladder. She does not wish to try a prescribed medication at this time with her other active medication therapies.  Plan for surgical sling. She will follow up for her pre-op.   Patient to follow up for pre-op apt and have surgical sling.

## 2022-03-28 ENCOUNTER — Ambulatory Visit: Payer: Medicare Other | Admitting: Obstetrics and Gynecology

## 2022-04-01 ENCOUNTER — Ambulatory Visit: Payer: Medicare Other | Admitting: Obstetrics and Gynecology

## 2022-04-01 DIAGNOSIS — E119 Type 2 diabetes mellitus without complications: Secondary | ICD-10-CM | POA: Diagnosis not present

## 2022-04-01 DIAGNOSIS — Z6841 Body Mass Index (BMI) 40.0 and over, adult: Secondary | ICD-10-CM | POA: Diagnosis not present

## 2022-04-01 DIAGNOSIS — R059 Cough, unspecified: Secondary | ICD-10-CM | POA: Diagnosis not present

## 2022-04-01 DIAGNOSIS — R030 Elevated blood-pressure reading, without diagnosis of hypertension: Secondary | ICD-10-CM | POA: Diagnosis not present

## 2022-04-01 DIAGNOSIS — Z3200 Encounter for pregnancy test, result unknown: Secondary | ICD-10-CM | POA: Diagnosis not present

## 2022-04-01 DIAGNOSIS — J209 Acute bronchitis, unspecified: Secondary | ICD-10-CM | POA: Diagnosis not present

## 2022-04-03 DIAGNOSIS — J019 Acute sinusitis, unspecified: Secondary | ICD-10-CM | POA: Diagnosis not present

## 2022-04-07 ENCOUNTER — Ambulatory Visit: Payer: Medicare Other | Admitting: Obstetrics and Gynecology

## 2022-04-08 DIAGNOSIS — J101 Influenza due to other identified influenza virus with other respiratory manifestations: Secondary | ICD-10-CM | POA: Diagnosis not present

## 2022-04-08 DIAGNOSIS — R062 Wheezing: Secondary | ICD-10-CM | POA: Diagnosis not present

## 2022-04-08 DIAGNOSIS — J111 Influenza due to unidentified influenza virus with other respiratory manifestations: Secondary | ICD-10-CM | POA: Diagnosis not present

## 2022-04-08 DIAGNOSIS — E119 Type 2 diabetes mellitus without complications: Secondary | ICD-10-CM | POA: Diagnosis not present

## 2022-04-14 ENCOUNTER — Encounter: Payer: Self-pay | Admitting: Obstetrics and Gynecology

## 2022-04-14 ENCOUNTER — Ambulatory Visit (INDEPENDENT_AMBULATORY_CARE_PROVIDER_SITE_OTHER): Payer: Medicare Other | Admitting: Obstetrics and Gynecology

## 2022-04-14 ENCOUNTER — Encounter (HOSPITAL_BASED_OUTPATIENT_CLINIC_OR_DEPARTMENT_OTHER): Payer: Self-pay | Admitting: Obstetrics and Gynecology

## 2022-04-14 VITALS — BP 102/72 | HR 111

## 2022-04-14 DIAGNOSIS — N3281 Overactive bladder: Secondary | ICD-10-CM

## 2022-04-14 DIAGNOSIS — Z01818 Encounter for other preprocedural examination: Secondary | ICD-10-CM

## 2022-04-14 DIAGNOSIS — N393 Stress incontinence (female) (male): Secondary | ICD-10-CM | POA: Diagnosis not present

## 2022-04-14 MED ORDER — POLYETHYLENE GLYCOL 3350 17 GM/SCOOP PO POWD: 17.0000 g | Freq: Every day | ORAL | 0 refills | Status: AC

## 2022-04-14 MED ORDER — METHOCARBAMOL 750 MG PO TABS: 750.0000 mg | ORAL_TABLET | Freq: Three times a day (TID) | ORAL | 0 refills | Status: AC

## 2022-04-14 MED ORDER — ACETAMINOPHEN 500 MG PO TABS: 500.0000 mg | ORAL_TABLET | Freq: Four times a day (QID) | ORAL | 0 refills | Status: AC | PRN

## 2022-04-14 NOTE — Progress Notes (Signed)
[facility] Pre-Operative H&P  Subjective Chief Complaint: Lindsey Nicholson presents for a preoperative encounter.   History of Present Illness: Lindsey Nicholson is a 52 y.o. female who presents for preoperative visit.  She is scheduled to undergo TRANSVAGINAL TAPE (TVT) PROCEDURE----SINGLE INCISION SLING ([facility])  on 04/28/2022.  Her symptoms include SUI, and she was was found to have Stage I anterior, Stage I posterior, Stage I apical prolapse.   Urodynamics showed: 1. Sensation was increased; capacity was reduced 2. Stress Incontinence was demonstrated at normal pressures in the standing position; 3. Detrusor Overactivity was not demonstrated with leakage. 4. Emptying was dysfunctional with a normal PVR, a detrusor contraction present (but due to catheter explusion, full Pdet not obtained during void),  abdominal straining present, dyssynergic urethral sphincter activity on EMG.  Past Medical History:  Diagnosis Date   Anxiety    Barrett esophagus    Bipolar 1 disorder ([place])    Borderline personality disorder ([place])    Borderline personality disorder ([place])    Carpal tunnel syndrome    Cervical dysplasia    Gastroparesis    GERD (gastroesophageal reflux disease)    History of vulvar dysplasia    vin I  --- s/p  wide excision 06-21-2012    Hypothyroidism    Migraines    Moderate major depression ([place])    Morbid obesity ([place])    NAFL (nonalcoholic fatty liver)    Pre-diabetes    Premature ovarian failure    PTSD (post-traumatic stress disorder)    Wears glasses      Past Surgical History:  Procedure Laterality Date   CARPAL TUNNEL RELEASE Bilateral LEFT  2013//  RIGHT  JAN  2015   CHOLECYSTECTOMY  2011   DILATATION & CURETTAGE/HYSTEROSCOPY WITH MYOSURE N/A 05/04/2018   Procedure: DILATATION & CURETTAGE/HYSTEROSCOPY WITH MYOSURE  POLYPECTOMY AND FAILED NOVASURE ABLATION;  Surgeon: Molli Posey, MD;  Location: [facility];  Service: Gynecology;  Laterality: N/A;    HYSTEROSCOPY WITH D & C  10/17/2010   Procedure: DILATATION AND CURETTAGE (D&C) /HYSTEROSCOPY;  Surgeon: Catha Brow;  Location: [facility];  Service: Gynecology;  Laterality: N/A;   HYSTEROSCOPY WITH D & C N/A 06/21/2012   Procedure: DILATATION AND CURETTAGE /HYSTEROSCOPY;  Surgeon: Margarette Asal, MD;  Location: [facility];  Service: Gynecology;  Laterality: N/A;   KNEE ARTHROSCOPY WITH MEDIAL MENISECTOMY Left 04/15/2017   Procedure: Left knee arthroscopic partial medial, lateral and partial menisectomies with lateral release;  Surgeon: Nicholes Stairs, MD;  Location: [facility];  Service: Orthopedics;  Laterality: Left;   LAPAROSCOPIC GASTRIC BAND REMOVAL WITH LAPAROSCOPIC GASTRIC SLEEVE RESECTION N/A    LAPAROSCOPIC TUBAL LIGATION  01/09/2011   Procedure: LAPAROSCOPIC TUBAL LIGATION;  Surgeon: Margarette Asal;  Location: [facility];  Service: Gynecology;  Laterality: Bilateral;  with Filshie clips   LEEP  01/09/2011   Procedure: LOOP ELECTROSURGICAL EXCISION PROCEDURE (LEEP);  Surgeon: Margarette Asal;  Location: [facility];  Service: Gynecology;  Laterality: N/A;   LEEP N/A 09/16/2013   Procedure: LOOP ELECTROSURGICAL EXCISION PROCEDURE (LEEP);  Surgeon: Margarette Asal, MD;  Location: [facility];  Service: Gynecology;  Laterality: N/A;   NEGATIVE SLEEP STUDY  2014   per pt   ORIF LEFT FOOT FX  2008   HARDWARE  REMOVED 6 [place] GASTRIC [place]ALL[place] JULY 2018   VULVECTOMY N/A 06/21/2012   Procedure: WIDE EXCISION VULVECTOMY;  Surgeon: Margarette Asal, MD;  Location: [facility];  Service: Gynecology;  Laterality: N/A;  excision of vulvar lesions,  need colposcope    is allergic to aripiprazole, codeine, hydrocodone, oxycodone, tramadol, trazodone, trazodone and nefazodone, and nefazodone.   Family History  Problem Relation Age of Onset   Hypertension Mother    Cancer Mother    Mental illness Mother    Alcohol abuse Mother    Drug abuse Mother     Heart disease Mother    Diabetes Father    Bipolar disorder Sister    Alcohol abuse Maternal Grandfather    Alcohol abuse Maternal Aunt    Drug abuse Maternal Aunt    Depression Maternal Aunt    Schizophrenia Maternal Aunt    Alcohol abuse Maternal Uncle     Social History   Tobacco Use   Smoking status: Every Day    Packs/day: 0.50    Years: 25.00    Total pack years: 12.50    Types: Cigarettes    Passive exposure: Current   Smokeless tobacco: Never  Vaping Use   Vaping Use: Former  Substance Use Topics   Alcohol use: No   Drug use: No     Review of Systems was negative for a full 10 system review except as noted in the History of Present Illness.   Current Outpatient Medications:    acetaminophen (TYLENOL) 500 MG tablet, Take 1 tablet (500 mg total) by mouth every 6 (six) hours as needed (pain)., Disp: 30 tablet, Rfl: 0   cetirizine (ZYRTEC) 10 MG tablet, Take 10 mg by mouth daily., Disp: , Rfl:    DULoxetine (CYMBALTA) 60 MG capsule, Take 1 capsule (60 mg total) by mouth daily., Disp: , Rfl:    hydrOXYzine (ATARAX) 25 MG tablet, Take 1 tablet (25 mg total) by mouth 3 (three) times daily as needed for anxiety., Disp: 30 tablet, Rfl: 0   hydrOXYzine (ATARAX) 50 MG tablet, Take 1 tablet (50 mg total) by mouth at bedtime., Disp: 30 tablet, Rfl: 0   lumateperone tosylate (CAPLYTA) 42 MG capsule, Take 1 capsule (42 mg total) by mouth at bedtime., Disp: 30 capsule, Rfl:    methocarbamol (ROBAXIN-750) 750 MG tablet, Take 1 tablet (750 mg total) by mouth 3 (three) times daily., Disp: 21 tablet, Rfl: 0   Multiple Vitamin (MULTIVITAMIN WITH MINERALS) TABS tablet, Take 1 tablet by mouth daily., Disp: , Rfl:    Pediatric Multivitamins-Iron (FLINTSTONES PLUS IRON PO), Take 1 tablet by mouth daily., Disp: , Rfl:    polyethylene glycol powder (GLYCOLAX/MIRALAX) 17 GM/SCOOP powder, Take 17 g by mouth daily. Drink 17g (1 scoop) dissolved in water per day., Disp: 255 g, Rfl: 0   potassium  chloride SA (KLOR-CON M) 20 MEQ tablet, Take 1 tablet (20 mEq total) by mouth daily., Disp: 5 tablet, Rfl: 0   promethazine (PHENERGAN) 12.5 MG tablet, Take 12.5 mg by mouth 2 (two) times daily as needed for nausea or vomiting., Disp: , Rfl:    tirzepatide (MOUNJARO) 7.5 MG/0.5ML Pen, Inject 7.5 mg into the skin once a week. Every Friday, Disp: , Rfl:    Objective Vitals:   04/14/22 1015  BP: 102/72  Pulse: (!) 111    Gen: NAD CV: S1 S2 RRR Lungs: Clear to auscultation bilaterally Abd: soft, nontender   Previous Pelvic Exam showed: Speculum exam reveals normal vaginal mucosa without atrophy. Cervix normal appearance. Uterus normal single, nontender. Adnexa no mass, fullness, tenderness.   Pelvic floor strength II/V Pelvic floor musculature: Right levator tender, Right obturator tender, Left  levator tender, Left obturator tender  POP-Q  -2                                            Aa   -2                                           Ba  -5                                              C   3                                            Gh  4                                            Pb  7                                            tvl   -1.5                                            Ap  -1.5                                            Bp  -6                                              D     Assessment/ Plan  Assessment: The patient is a 52 y.o. year old scheduled to undergo TRANSVAGINAL TAPE (TVT) PROCEDURE----SINGLE INCISION SLING ([facility]).   Plan: General Surgical Consent: The patient has previously been counseled on alternative treatments, and the decision by the patient and provider was to proceed with the procedure listed above.  For all procedures, there are risks of bleeding, infection, damage to surrounding organs including but not limited to bowel, bladder, blood vessels, ureters and nerves, and need for further surgery if an injury were to occur. These risks  are all low with minimally invasive surgery.   There are risks of numbness and weakness at any body site or buttock/rectal pain.  It is possible that baseline pain can be worsened by surgery, either with or without mesh. If surgery is vaginal, there is also a low risk of possible conversion to laparoscopy or open abdominal incision where indicated. Very rare risks include blood transfusion, blood clot, heart attack, pneumonia, or death.  There is also a risk of short-term postoperative urinary retention with need to use a catheter. About half of patients need to go home from surgery with a catheter, which is then later removed in the office. The risk of long-term need for a catheter is very low. There is also a risk of worsening of overactive bladder.   Sling: The effectiveness of a midurethral vaginal mesh sling is approximately 85%, and thus, there will be times when you may leak urine after surgery, especially if your bladder is full or if you have a strong cough. There is a balance between making the sling tight enough to treat your leakage but not too tight so that you have long-term difficulty emptying your bladder. A mesh sling will not directly treat overactive bladder/urge incontinence and may worsen it.  There is an FDA safety notification on vaginal mesh procedures for prolapse but NOT mesh slings. We have extensive experience and training with mesh placement and we have close postoperative follow up to identify any potential complications from mesh. It is important to realize that this mesh is a permanent implant that cannot be easily removed. There are rare risks of mesh exposure (2-4%), pain with intercourse (0-7%), and infection (<1%). The risk of mesh exposure if more likely in a woman with risks for poor healing (prior radiation, poorly controlled diabetes, or immunocompromised). The risk of new or worsened chronic pain after mesh implant is more common in women with baseline chronic pain  and/or poorly controlled anxiety or depression. Approximately 2-4% of patients will experience longer-term post-operative voiding dysfunction that may require surgical revision of the sling. We also reviewed that postoperatively, her stream may not be as strong as before surgery.    We discussed consent for blood products. Risks for blood transfusion include allergic reactions, other reactions that can affect different body organs and managed accordingly, transmission of infectious diseases such as HIV or Hepatitis. However, the blood is screened. Patient consents for blood products.  Pre-operative instructions:  She was instructed to not take Aspirin/NSAIDs x 7days prior to surgery, which she does not as she has had gastric bypass.  Antibiotic prophylaxis was ordered as indicated.  Catheter use: Patient will go home with foley if needed after post-operative voiding trial.  Post-operative instructions:  She was provided with specific post-operative instructions, including precautions and signs/symptoms for which we would recommend contacting us, in addition to daytime and after-hours contact phone numbers. This was provided on a handout.   Post-operative medications: Prescriptions for methocarbamol, tylenol, and miralax were sent to her pharmacy. Discussed tylenol on a schedule.   Laboratory testing:   None required at this time, we discussed if anesthesia requires it then they will let her know.   Preoperative clearance:  She does not require surgical clearance.    Post-operative follow-up:  A post-operative appointment will be made for 6 weeks from the date of surgery. If she needs a post-operative nurse visit for a voiding trial, that will be set up after she leaves the hospital.    Patient will call the clinic or use MyChart should anything change or any new issues arise.   Berton Mount, NP

## 2022-04-14 NOTE — Patient Instructions (Signed)
You have been given pre-op and post of information today. Medications send to the pharmacy so you can pick them up prior to surgery. Lidocaine jelly given that you can use 2-3 days following surgery.

## 2022-04-14 NOTE — H&P (Signed)
[facility]  H&P  Subjective Chief Complaint: Lindsey Nicholson presents for a preoperative encounter.   History of Present Illness: Lindsey Nicholson is a 52 y.o. female who presents for preoperative visit.  She is scheduled to undergo TRANSVAGINAL TAPE (TVT) PROCEDURE----SINGLE INCISION SLING ([facility])  on 04/28/2022.  Her symptoms include SUI, and she was was found to have Stage I anterior, Stage I posterior, Stage I apical prolapse.   Urodynamics showed: 1. Sensation was increased; capacity was reduced 2. Stress Incontinence was demonstrated at normal pressures in the standing position; 3. Detrusor Overactivity was not demonstrated with leakage. 4. Emptying was dysfunctional with a normal PVR, a detrusor contraction present (but due to catheter explusion, full Pdet not obtained during void),  abdominal straining present, dyssynergic urethral sphincter activity on EMG.  Past Medical History:  Diagnosis Date   Anxiety    Barrett esophagus    Bipolar 1 disorder ([place])    Borderline personality disorder ([place])    Borderline personality disorder ([place])    Carpal tunnel syndrome    Cervical dysplasia    Gastroparesis    GERD (gastroesophageal reflux disease)    History of vulvar dysplasia    vin I  --- s/p  wide excision 06-21-2012    Hypothyroidism    Migraines    Moderate major depression ([place])    Morbid obesity ([place])    NAFL (nonalcoholic fatty liver)    Pre-diabetes    Premature ovarian failure    PTSD (post-traumatic stress disorder)    Wears glasses      Past Surgical History:  Procedure Laterality Date   CARPAL TUNNEL RELEASE Bilateral LEFT  2013//  RIGHT  JAN  2015   CHOLECYSTECTOMY  2011   DILATATION & CURETTAGE/HYSTEROSCOPY WITH MYOSURE N/A 05/04/2018   Procedure: DILATATION & CURETTAGE/HYSTEROSCOPY WITH MYOSURE  POLYPECTOMY AND FAILED NOVASURE ABLATION;  Surgeon: Molli Posey, MD;  Location: [facility];  Service: Gynecology;  Laterality: N/A;   HYSTEROSCOPY  WITH D & C  10/17/2010   Procedure: DILATATION AND CURETTAGE (D&C) /HYSTEROSCOPY;  Surgeon: Catha Brow;  Location: [facility];  Service: Gynecology;  Laterality: N/A;   HYSTEROSCOPY WITH D & C N/A 06/21/2012   Procedure: DILATATION AND CURETTAGE /HYSTEROSCOPY;  Surgeon: Margarette Asal, MD;  Location: [facility];  Service: Gynecology;  Laterality: N/A;   KNEE ARTHROSCOPY WITH MEDIAL MENISECTOMY Left 04/15/2017   Procedure: Left knee arthroscopic partial medial, lateral and partial menisectomies with lateral release;  Surgeon: Nicholes Stairs, MD;  Location: [facility];  Service: Orthopedics;  Laterality: Left;   LAPAROSCOPIC GASTRIC BAND REMOVAL WITH LAPAROSCOPIC GASTRIC SLEEVE RESECTION N/A    LAPAROSCOPIC TUBAL LIGATION  01/09/2011   Procedure: LAPAROSCOPIC TUBAL LIGATION;  Surgeon: Margarette Asal;  Location: [facility];  Service: Gynecology;  Laterality: Bilateral;  with Filshie clips   LEEP  01/09/2011   Procedure: LOOP ELECTROSURGICAL EXCISION PROCEDURE (LEEP);  Surgeon: Margarette Asal;  Location: [facility];  Service: Gynecology;  Laterality: N/A;   LEEP N/A 09/16/2013   Procedure: LOOP ELECTROSURGICAL EXCISION PROCEDURE (LEEP);  Surgeon: Margarette Asal, MD;  Location: [facility];  Service: Gynecology;  Laterality: N/A;   NEGATIVE SLEEP STUDY  2014   per pt   ORIF LEFT FOOT FX  2008   HARDWARE  REMOVED 6 [place] GASTRIC [place] JULY 2018   VULVECTOMY N/A 06/21/2012   Procedure: WIDE EXCISION VULVECTOMY;  Surgeon: Margarette Asal, MD;  Location: [facility];  Service: Gynecology;  Laterality: N/A;  excision of vulvar lesions,  need colposcope    is allergic to aripiprazole, codeine, hydrocodone, oxycodone, tramadol, trazodone, trazodone and nefazodone, and nefazodone.   Family History  Problem Relation Age of Onset   Hypertension Mother    Cancer Mother    Mental illness Mother    Alcohol abuse Mother    Drug abuse Mother    Heart disease  Mother    Diabetes Father    Bipolar disorder Sister    Alcohol abuse Maternal Grandfather    Alcohol abuse Maternal Aunt    Drug abuse Maternal Aunt    Depression Maternal Aunt    Schizophrenia Maternal Aunt    Alcohol abuse Maternal Uncle     Social History   Tobacco Use   Smoking status: Every Day    Packs/day: 0.50    Years: 25.00    Total pack years: 12.50    Types: Cigarettes    Passive exposure: Current   Smokeless tobacco: Never  Vaping Use   Vaping Use: Former  Substance Use Topics   Alcohol use: No   Drug use: No     Review of Systems was negative for a full 10 system review except as noted in the History of Present Illness.  No current facility-administered medications for this encounter.  Current Outpatient Medications:    acetaminophen (TYLENOL) 500 MG tablet, Take 1 tablet (500 mg total) by mouth every 6 (six) hours as needed (pain)., Disp: 30 tablet, Rfl: 0   cetirizine (ZYRTEC) 10 MG tablet, Take 10 mg by mouth daily., Disp: , Rfl:    DULoxetine (CYMBALTA) 60 MG capsule, Take 1 capsule (60 mg total) by mouth daily., Disp: , Rfl:    hydrOXYzine (ATARAX) 25 MG tablet, Take 1 tablet (25 mg total) by mouth 3 (three) times daily as needed for anxiety., Disp: 30 tablet, Rfl: 0   hydrOXYzine (ATARAX) 50 MG tablet, Take 1 tablet (50 mg total) by mouth at bedtime., Disp: 30 tablet, Rfl: 0   lumateperone tosylate (CAPLYTA) 42 MG capsule, Take 1 capsule (42 mg total) by mouth at bedtime., Disp: 30 capsule, Rfl:    methocarbamol (ROBAXIN-750) 750 MG tablet, Take 1 tablet (750 mg total) by mouth 3 (three) times daily., Disp: 21 tablet, Rfl: 0   Multiple Vitamin (MULTIVITAMIN WITH MINERALS) TABS tablet, Take 1 tablet by mouth daily., Disp: , Rfl:    Pediatric Multivitamins-Iron (FLINTSTONES PLUS IRON PO), Take 1 tablet by mouth daily., Disp: , Rfl:    polyethylene glycol powder (GLYCOLAX/MIRALAX) 17 GM/SCOOP powder, Take 17 g by mouth daily. Drink 17g (1 scoop) dissolved  in water per day., Disp: 255 g, Rfl: 0   potassium chloride SA (KLOR-CON M) 20 MEQ tablet, Take 1 tablet (20 mEq total) by mouth daily., Disp: 5 tablet, Rfl: 0   promethazine (PHENERGAN) 12.5 MG tablet, Take 12.5 mg by mouth 2 (two) times daily as needed for nausea or vomiting., Disp: , Rfl:    tirzepatide (MOUNJARO) 7.5 MG/0.5ML Pen, Inject 7.5 mg into the skin once a week. Every Friday, Disp: , Rfl:    Objective There were no vitals filed for this visit.   Gen: NAD CV: S1 S2 RRR Lungs: Clear to auscultation bilaterally Abd: soft, nontender   Previous Pelvic Exam showed: Speculum exam reveals normal vaginal mucosa without atrophy. Cervix normal appearance. Uterus normal single, nontender. Adnexa no mass, fullness, tenderness.   Pelvic floor strength II/V Pelvic floor musculature: Right levator tender, Right obturator  tender, Left levator tender, Left obturator tender   Assessment/ Plan  The patient is a 52 y.o. year old scheduled to undergo TRANSVAGINAL TAPE (TVT) PROCEDURE----SINGLE INCISION SLING ([facility]).

## 2022-04-21 ENCOUNTER — Ambulatory Visit: Payer: Medicare Other | Admitting: Obstetrics and Gynecology

## 2022-04-23 ENCOUNTER — Encounter (HOSPITAL_BASED_OUTPATIENT_CLINIC_OR_DEPARTMENT_OTHER): Payer: Self-pay | Admitting: Obstetrics and Gynecology

## 2022-04-23 NOTE — Progress Notes (Addendum)
Spoke w/ via phone for pre-op interview--- pt  Lab needs dos---- istat,  urine preg              Lab results------ current EKG in epic/ chart COVID test -----patient states asymptomatic no test needed Arrive at ------- 1145 on 04-23-2022 NPO after MN NO Solid Food.  Clear liquids from MN until--- 1045 Med rec completed Medications to take morning of surgery ----- asked pt to take protonix, since she does want to take any other meds morning of surgery Diabetic medication ----- last mounjaro injection 04-18-2022 Patient instructed no nail polish to be worn day of surgery Patient instructed to bring photo id and insurance card day of surgery Patient aware to have Driver (ride ) / caregiver    for 24 hours after surgery -- husband, jason Patient Special Instructions -----  pt stated was not given instructions from Dr Wannetta Sender office about not doing mounjaro injection week prior to surgery.  Pt verbalized understanding to not do her Friday dose this week 04-25-2022 prior to surgery and why. Pre-Op special Istructions -----  pt stated has multiple piercing's , she will take out tongue piercing but all the other either can't come off or they are "industrial" want come off.  Pt aware to take off in piercing's , metal, jewelry on body but she stated they can tape like has been done before.  Told pt she will speak with anesthesia dos.  Patient verbalized understanding of instructions that were given at this phone interview. Patient denies shortness of breath, chest pain, fever, cough at this phone interview.

## 2022-04-28 ENCOUNTER — Other Ambulatory Visit: Payer: Self-pay

## 2022-04-28 ENCOUNTER — Encounter (HOSPITAL_BASED_OUTPATIENT_CLINIC_OR_DEPARTMENT_OTHER)
Admission: RE | Disposition: A | Discharge status: Home / Self Care | Payer: Self-pay | Source: Home / Self Care | Attending: Obstetrics and Gynecology

## 2022-04-28 ENCOUNTER — Encounter (HOSPITAL_BASED_OUTPATIENT_CLINIC_OR_DEPARTMENT_OTHER): Payer: Self-pay | Admitting: Obstetrics and Gynecology

## 2022-04-28 ENCOUNTER — Ambulatory Visit (HOSPITAL_BASED_OUTPATIENT_CLINIC_OR_DEPARTMENT_OTHER): Payer: Medicare Other | Admitting: Anesthesiology

## 2022-04-28 ENCOUNTER — Ambulatory Visit (HOSPITAL_BASED_OUTPATIENT_CLINIC_OR_DEPARTMENT_OTHER)
Admission: RE | Admit: 2022-04-28 | Discharge: 2022-04-28 | Disposition: A | Discharge status: Home / Self Care | Payer: Medicare Other | Attending: Obstetrics and Gynecology | Admitting: Obstetrics and Gynecology

## 2022-04-28 ENCOUNTER — Ambulatory Visit: Payer: Medicare Other | Admitting: Obstetrics and Gynecology

## 2022-04-28 DIAGNOSIS — F419 Anxiety disorder, unspecified: Secondary | ICD-10-CM | POA: Insufficient documentation

## 2022-04-28 DIAGNOSIS — M1990 Unspecified osteoarthritis, unspecified site: Secondary | ICD-10-CM

## 2022-04-28 DIAGNOSIS — F319 Bipolar disorder, unspecified: Secondary | ICD-10-CM | POA: Diagnosis not present

## 2022-04-28 DIAGNOSIS — E039 Hypothyroidism, unspecified: Secondary | ICD-10-CM | POA: Insufficient documentation

## 2022-04-28 DIAGNOSIS — K219 Gastro-esophageal reflux disease without esophagitis: Secondary | ICD-10-CM | POA: Insufficient documentation

## 2022-04-28 DIAGNOSIS — F603 Borderline personality disorder: Secondary | ICD-10-CM | POA: Diagnosis not present

## 2022-04-28 DIAGNOSIS — F418 Other specified anxiety disorders: Secondary | ICD-10-CM

## 2022-04-28 DIAGNOSIS — N393 Stress incontinence (female) (male): Secondary | ICD-10-CM

## 2022-04-28 DIAGNOSIS — Z7985 Long-term (current) use of injectable non-insulin antidiabetic drugs: Secondary | ICD-10-CM | POA: Insufficient documentation

## 2022-04-28 DIAGNOSIS — E119 Type 2 diabetes mellitus without complications: Secondary | ICD-10-CM | POA: Diagnosis not present

## 2022-04-28 DIAGNOSIS — G4730 Sleep apnea, unspecified: Secondary | ICD-10-CM | POA: Diagnosis not present

## 2022-04-28 DIAGNOSIS — Z01818 Encounter for other preprocedural examination: Secondary | ICD-10-CM

## 2022-04-28 DIAGNOSIS — Z79899 Other long term (current) drug therapy: Secondary | ICD-10-CM | POA: Diagnosis not present

## 2022-04-28 DIAGNOSIS — F17210 Nicotine dependence, cigarettes, uncomplicated: Secondary | ICD-10-CM | POA: Insufficient documentation

## 2022-04-28 DIAGNOSIS — Z6841 Body Mass Index (BMI) 40.0 and over, adult: Secondary | ICD-10-CM | POA: Insufficient documentation

## 2022-04-28 DIAGNOSIS — K760 Fatty (change of) liver, not elsewhere classified: Secondary | ICD-10-CM | POA: Diagnosis not present

## 2022-04-28 HISTORY — DX: Obstructive sleep apnea (adult) (pediatric): G47.33

## 2022-04-28 HISTORY — DX: Spondylosis without myelopathy or radiculopathy, lumbar region: M47.816

## 2022-04-28 HISTORY — DX: Personal history of cervical dysplasia: Z87.410

## 2022-04-28 HISTORY — DX: Dermatitis, unspecified: L30.9

## 2022-04-28 HISTORY — DX: Bipolar II disorder: F31.81

## 2022-04-28 HISTORY — DX: Generalized anxiety disorder: F41.1

## 2022-04-28 HISTORY — DX: Other bursitis of hip, right hip: M70.71

## 2022-04-28 HISTORY — PX: BLADDER SUSPENSION: SHX72

## 2022-04-28 HISTORY — DX: Stress incontinence (female) (male): N39.3

## 2022-04-28 HISTORY — DX: Other intervertebral disc degeneration, lumbar region: M51.36

## 2022-04-28 HISTORY — PX: CYSTOSCOPY: SHX5120

## 2022-04-28 HISTORY — DX: Vitamin D deficiency, unspecified: E55.9

## 2022-04-28 HISTORY — DX: Postsurgical malabsorption, not elsewhere classified: K91.2

## 2022-04-28 HISTORY — DX: Major depressive disorder, single episode, unspecified: F32.9

## 2022-04-28 HISTORY — DX: Hypothyroidism, unspecified: E03.9

## 2022-04-28 HISTORY — DX: Type 2 diabetes mellitus without complications: E11.9

## 2022-04-28 LAB — POCT I-STAT, CHEM 8
BUN: 14 mg/dL (ref 6–20)
Calcium, Ion: 1.13 mmol/L — ABNORMAL LOW (ref 1.15–1.40)
Chloride: 100 mmol/L (ref 98–111)
Creatinine, Ser: 0.9 mg/dL (ref 0.44–1.00)
Glucose, Bld: 80 mg/dL (ref 70–99)
HCT: 39 % (ref 36.0–46.0)
Hemoglobin: 13.3 g/dL (ref 12.0–15.0)
Potassium: 4.1 mmol/L (ref 3.5–5.1)
Sodium: 142 mmol/L (ref 135–145)
TCO2: 27 mmol/L (ref 22–32)

## 2022-04-28 LAB — GLUCOSE, CAPILLARY: Glucose-Capillary: 99 mg/dL (ref 70–99)

## 2022-04-28 LAB — POCT PREGNANCY, URINE: Preg Test, Ur: NEGATIVE

## 2022-04-28 SURGERY — URETHROPEXY, USING TRANSVAGINAL TAPE
Anesthesia: General | Site: Vagina

## 2022-04-28 MED ORDER — SODIUM CHLORIDE 0.9 % IR SOLN
Status: DC | PRN
  Administered 2022-04-28: 200 mL via INTRAVESICAL

## 2022-04-28 MED ORDER — PROPOFOL 10 MG/ML IV BOLUS
INTRAVENOUS | Status: DC | PRN
  Administered 2022-04-28: 150 mg via INTRAVENOUS

## 2022-04-28 MED ORDER — ROCURONIUM BROMIDE 10 MG/ML (PF) SYRINGE
PREFILLED_SYRINGE | INTRAVENOUS | Status: DC | PRN
  Administered 2022-04-28: 100 mg via INTRAVENOUS

## 2022-04-28 MED ORDER — MIDAZOLAM HCL 5 MG/5ML IJ SOLN
INTRAMUSCULAR | Status: DC | PRN
  Administered 2022-04-28: 2 mg via INTRAVENOUS

## 2022-04-28 MED ORDER — ONDANSETRON HCL 4 MG/2ML IJ SOLN
INTRAMUSCULAR | Status: DC | PRN
  Administered 2022-04-28: 4 mg via INTRAVENOUS

## 2022-04-28 MED ORDER — LIDOCAINE-EPINEPHRINE 1 %-1:100000 IJ SOLN
INTRAMUSCULAR | Status: DC | PRN
  Administered 2022-04-28: 10 mL

## 2022-04-28 MED ORDER — PROMETHAZINE HCL 25 MG/ML IJ SOLN: 6.2500 mg | INTRAMUSCULAR | Status: DC | PRN

## 2022-04-28 MED ORDER — 0.9 % SODIUM CHLORIDE (POUR BTL) OPTIME
TOPICAL | Status: DC | PRN
  Administered 2022-04-28: 500 mL

## 2022-04-28 MED ORDER — CEFAZOLIN SODIUM-DEXTROSE 2-4 GM/100ML-% IV SOLN
2.0000 g | INTRAVENOUS | Status: AC
  Administered 2022-04-28: 2 g via INTRAVENOUS

## 2022-04-28 MED ORDER — PHENYLEPHRINE 80 MCG/ML (10ML) SYRINGE FOR IV PUSH (FOR BLOOD PRESSURE SUPPORT)
PREFILLED_SYRINGE | INTRAVENOUS | Status: DC | PRN
  Administered 2022-04-28: 40 ug via INTRAVENOUS
  Administered 2022-04-28 (×2): 80 ug via INTRAVENOUS

## 2022-04-28 MED ORDER — PHENYLEPHRINE 80 MCG/ML (10ML) SYRINGE FOR IV PUSH (FOR BLOOD PRESSURE SUPPORT)
PREFILLED_SYRINGE | INTRAVENOUS | Status: AC
  Filled 2022-04-28: qty 10

## 2022-04-28 MED ORDER — DEXAMETHASONE SODIUM PHOSPHATE 10 MG/ML IJ SOLN
INTRAMUSCULAR | Status: DC | PRN
  Administered 2022-04-28: 5 mg via INTRAVENOUS

## 2022-04-28 MED ORDER — FENTANYL CITRATE (PF) 100 MCG/2ML IJ SOLN
INTRAMUSCULAR | Status: AC
  Filled 2022-04-28: qty 2

## 2022-04-28 MED ORDER — DEXMEDETOMIDINE HCL IN NACL 80 MCG/20ML IV SOLN
INTRAVENOUS | Status: DC | PRN
  Administered 2022-04-28: 12 ug via BUCCAL

## 2022-04-28 MED ORDER — AMISULPRIDE (ANTIEMETIC) 5 MG/2ML IV SOLN: 10.0000 mg | Freq: Once | INTRAVENOUS | Status: DC | PRN

## 2022-04-28 MED ORDER — HYDROMORPHONE HCL 1 MG/ML IJ SOLN: 0.2500 mg | INTRAMUSCULAR | Status: DC | PRN

## 2022-04-28 MED ORDER — CEFAZOLIN SODIUM-DEXTROSE 2-4 GM/100ML-% IV SOLN
INTRAVENOUS | Status: AC
  Filled 2022-04-28: qty 100

## 2022-04-28 MED ORDER — ONDANSETRON HCL 4 MG/2ML IJ SOLN
INTRAMUSCULAR | Status: AC
  Filled 2022-04-28: qty 2

## 2022-04-28 MED ORDER — MIDAZOLAM HCL 2 MG/2ML IJ SOLN
INTRAMUSCULAR | Status: AC
  Filled 2022-04-28: qty 2

## 2022-04-28 MED ORDER — LACTATED RINGERS IV SOLN: INTRAVENOUS | Status: DC

## 2022-04-28 MED ORDER — PROPOFOL 10 MG/ML IV BOLUS
INTRAVENOUS | Status: AC
  Filled 2022-04-28: qty 20

## 2022-04-28 MED ORDER — LIDOCAINE 2% (20 MG/ML) 5 ML SYRINGE
INTRAMUSCULAR | Status: DC | PRN
  Administered 2022-04-28: 100 mg via INTRAVENOUS

## 2022-04-28 MED ORDER — FENTANYL CITRATE (PF) 100 MCG/2ML IJ SOLN
INTRAMUSCULAR | Status: DC | PRN
  Administered 2022-04-28: 100 ug via INTRAVENOUS
  Administered 2022-04-28: 50 ug via INTRAVENOUS

## 2022-04-28 MED ORDER — POVIDONE-IODINE 10 % EX SWAB: 2.0000 | Freq: Once | CUTANEOUS | Status: DC

## 2022-04-28 MED ORDER — SUGAMMADEX SODIUM 200 MG/2ML IV SOLN
INTRAVENOUS | Status: DC | PRN
  Administered 2022-04-28: 100 mg via INTRAVENOUS
  Administered 2022-04-28: 200 mg via INTRAVENOUS

## 2022-04-28 MED ORDER — DEXMEDETOMIDINE HCL IN NACL 80 MCG/20ML IV SOLN
INTRAVENOUS | Status: AC
  Filled 2022-04-28: qty 20

## 2022-04-28 MED ORDER — MEPERIDINE HCL 25 MG/ML IJ SOLN: 6.2500 mg | INTRAMUSCULAR | Status: DC | PRN

## 2022-04-28 SURGICAL SUPPLY — 35 items
ADH SKN CLS APL DERMABOND .7 (GAUZE/BANDAGES/DRESSINGS)
AGENT HMST KT MTR STRL THRMB (HEMOSTASIS)
BLADE CLIPPER SENSICLIP SURGIC (BLADE) ×2 IMPLANT
BLADE SURG 15 STRL LF DISP TIS (BLADE) ×2 IMPLANT
BLADE SURG 15 STRL SS (BLADE) ×2
DERMABOND ADVANCED .7 DNX12 (GAUZE/BANDAGES/DRESSINGS) ×2 IMPLANT
ELECT REM PT RETURN 9FT ADLT (ELECTROSURGICAL) ×2
ELECTRODE REM PT RTRN 9FT ADLT (ELECTROSURGICAL) IMPLANT
GAUZE 4X4 16PLY ~~LOC~~+RFID DBL (SPONGE) IMPLANT
GLOVE BIOGEL PI IND STRL 6.5 (GLOVE) ×2 IMPLANT
GLOVE ECLIPSE 6.0 STRL STRAW (GLOVE) ×2 IMPLANT
GOWN STRL REUS W/TWL LRG LVL3 (GOWN DISPOSABLE) ×2 IMPLANT
HIBICLENS CHG 4% 4OZ BTL (MISCELLANEOUS) ×2 IMPLANT
HOLDER FOLEY CATH W/STRAP (MISCELLANEOUS) ×2 IMPLANT
KIT TURNOVER CYSTO (KITS) ×2 IMPLANT
MANIFOLD NEPTUNE II (INSTRUMENTS) ×2 IMPLANT
NEEDLE HYPO 22GX1.5 SAFETY (NEEDLE) ×2 IMPLANT
NS IRRIG 1000ML POUR BTL (IV SOLUTION) ×2 IMPLANT
NS IRRIG 500ML POUR BTL (IV SOLUTION) IMPLANT
PACK CYSTO (CUSTOM PROCEDURE TRAY) ×2 IMPLANT
PACK VAGINAL WOMENS (CUSTOM PROCEDURE TRAY) ×2 IMPLANT
RETRACTOR LONE STAR DISPOSABLE (INSTRUMENTS) ×2 IMPLANT
RETRACTOR STAY HOOK 5MM (MISCELLANEOUS) ×2 IMPLANT
SET IRRIG Y TYPE TUR BLADDER L (SET/KITS/TRAYS/PACK) ×2 IMPLANT
SLING ALTIS SYSTEM (Sling) IMPLANT
SPIKE FLUID TRANSFER (MISCELLANEOUS) ×2 IMPLANT
SUCTION FRAZIER HANDLE 10FR (MISCELLANEOUS) ×2
SUCTION TUBE FRAZIER 10FR DISP (MISCELLANEOUS) ×2 IMPLANT
SURGIFLO W/THROMBIN 8M KIT (HEMOSTASIS) IMPLANT
SUT ABS MONO DBL WITH NDL 48IN (SUTURE) IMPLANT
SUT VIC AB 2-0 SH 27 (SUTURE) ×2
SUT VIC AB 2-0 SH 27XBRD (SUTURE) ×2 IMPLANT
SYR BULB EAR ULCER 3OZ GRN STR (SYRINGE) ×2 IMPLANT
TOWEL OR 17X26 10 PK STRL BLUE (TOWEL DISPOSABLE) ×2 IMPLANT
TRAY FOLEY W/BAG SLVR 14FR (SET/KITS/TRAYS/PACK) ×2 IMPLANT

## 2022-04-28 NOTE — Progress Notes (Addendum)
Patient called in this morning 04/28/22. She stated that she was not planning on taking any medications except for Protonix before surgery today. She said that she forgot and took everything. I reviewed what she had taken. The medications were okay to take. However, she did take a MVI and biotin. I called and spoke to [facility] in pre-op and let her know.  Patient stated that she took the following medications this morning:  Cymbalta Synthroid Pantoprazole Zyrtec Amantadine MVI Biotin

## 2022-04-28 NOTE — Transfer of Care (Signed)
Immediate Anesthesia Transfer of Care Note  Patient: Lindsey Nicholson  Procedure(s) Performed: TRANSVAGINAL TAPE (TVT) PROCEDURE----SINGLE INCISION SLING ([facility]) (Vagina ) CYSTOSCOPY (Bladder)  Patient Location: PACU  Anesthesia Type:General  Level of Consciousness: awake, alert , oriented, and patient cooperative  Airway & Oxygen Therapy: Patient Spontanous Breathing and Patient connected to face mask oxygen  Post-op Assessment: Report given to RN and Post -op Vital signs reviewed and stable  Post vital signs: Reviewed and stable  Last Vitals:  Vitals Value Taken Time  BP 98/66 04/28/22 1449  Temp    Pulse 87 04/28/22 1453  Resp 12 04/28/22 1453  SpO2 100 % 04/28/22 1453  Vitals shown include unvalidated device data.  Last Pain:  Vitals:   04/28/22 1159  TempSrc: Oral  PainSc: 0-No pain      Patients Stated Pain Goal: 3 (83/15/17 6160)  Complications: No notable events documented.

## 2022-04-28 NOTE — Discharge Instructions (Signed)
POST OPERATIVE INSTRUCTIONS  General Instructions Recovery (not bed rest) will last approximately 2 weeks Walking is encouraged, but refrain from strenuous exercise/ housework/ heavy lifting. No lifting >10lbs  Nothing in the vagina- NO intercourse, tampons or douching Bathing:  Do not submerge in water (NO swimming, bath, hot tub, etc) until after your postop visit. You can shower starting the day after surgery.  No driving until you are not taking narcotic pain medicine and until your pain is well enough controlled that you can slam on the breaks or make sudden movements if needed.   Taking your medications Please take your acetaminophen and ibuprofen on a schedule for the first 48 hours. Take 600mg  ibuprofen, then take 500mg  acetaminophen 3 hours later, then continue to alternate ibuprofen and acetaminophen. That way you are taking each type of medication every 6 hours. Take the prescribed narcotic (oxycodone, tramadol, etc) as needed, with a maximum being every 4 hours.  Take a stool softener daily to keep your stools soft and preventing you from straining. If you have diarrhea, you decrease your stool softener. This is explained more below. We have prescribed you Miralax.  Reasons to Call the Nurse (see last page for phone numbers) Heavy Bleeding (changing your pad every 1-2 hours) Persistent nausea/vomiting Fever (100.4 degrees or more) Incision problems (pus or other fluid coming out, redness, warmth, increased pain)  Things to Expect After Surgery Mild to Moderate pain is normal during the first day or two after surgery. If prescribed, take Ibuprofen or Tylenol first and use the stronger medicine for "break-through" pain. You can overlap these medicines because they work differently.   Constipation   To Prevent Constipation:  Eat a well-balanced diet including protein, grains, fresh fruit and vegetables.  Drink plenty of fluids. Walk regularly.  Depending on specific instructions  from your physician: take Miralax daily and additionally you can add a stool softener (colace/ docusate) and fiber supplement. Continue as long as you're on pain medications.   To Treat Constipation:  If you do not have a bowel movement in 2 days after surgery, you can take 2 Tbs of Milk of Magnesia 1-2 times a day until you have a bowel movement. If diarrhea occurs, decrease the amount or stop the laxative. If no results with Milk of Magnesia, you can drink a bottle of magnesium citrate which you can purchase over the counter.  Fatigue:  This is a normal response to surgery and will improve with time.  Plan frequent rest periods throughout the day.  Gas Pain:  This is very common but can also be very painful! Drink warm liquids such as herbal teas, bouillon or soup. Walking will help you pass more gas.  Mylicon or Gas-X can be taken over the counter.  Leaking Urine:  Varying amounts of leakage may occur after surgery.  This should improve with time. Your bladder needs at least 3 months to recover from surgery. If you leak after surgery, be sure to mention this to your doctor at your post-op visit. If you were taking medications for overactive bladder prior to surgery, be sure to restart the medications immediately after surgery.  Incisions: If you have incisions on your abdomen, the skin glue will dissolve on its own over time. It is ok to gently rinse with soap and water over these incisions but do not scrub.  Catheter Approximately 50% of patients are unable to urinate after surgery and need to go home with a catheter. This allows your bladder to  rest so it can return to full function. If you go home with a catheter, the office will call to set up a voiding trial a few days after surgery. For most patients, by this visit, they are able to urinate on their own. Long term catheter use is rare.  ? ?Return to Work ? ?As work demands and recovery times vary widely, it is hard to predict when you will want  to return to work. If you have a desk job with no strenuous physical activity, and if you would like to return sooner than generally recommended, discuss this with your provider or call our office.  ? ?Post op concerns ? ?For non-emergent issues, please call the Urogynecology Nurse. Please leave a message and someone will contact you within one business day.  You can also send a message through MyChart.  ? ?AFTER HOURS (After 5:00 PM and on weekends):  For urgent matters that cannot wait until the next business day. Call our office [phone] and connect to the doctor on call.  ?Please reserve this for important issues. ? ? ?**FOR ANY TRUE EMERGENCY ISSUES CALL 911 OR GO TO THE NEAREST EMERGENCY ROOM.** Please inform our office or the doctor on call of any emergency.   ? ? ?APPOINTMENTS: Call [phone] ? ?

## 2022-04-28 NOTE — Anesthesia Procedure Notes (Signed)
Procedure Name: Intubation Date/Time: 04/28/2022 2:03 PM  Performed by: Rogers Blocker, CRNAPre-anesthesia Checklist: Patient identified, Emergency Drugs available, Suction available and Patient being monitored Patient Re-evaluated:Patient Re-evaluated prior to induction Oxygen Delivery Method: Circle System Utilized Preoxygenation: Pre-oxygenation with 100% oxygen Induction Type: IV induction Ventilation: Mask ventilation without difficulty Laryngoscope Size: Mac and 3 Grade View: Grade I Tube type: Oral Tube size: 7.0 mm Number of attempts: 1 Airway Equipment and Method: Stylet and Bite block Placement Confirmation: ETT inserted through vocal cords under direct vision, positive ETCO2 and breath sounds checked- equal and bilateral Secured at: 22 cm Tube secured with: Tape Dental Injury: Teeth and Oropharynx as per pre-operative assessment

## 2022-04-28 NOTE — Progress Notes (Signed)
Instilled 300 ml NS into bladder per order. Cath removed and voided 200 ml. Bladder scan revealed o ml in bladder. Dr. Wannetta Sender aware. Ok to go home.

## 2022-04-28 NOTE — Progress Notes (Signed)
Patient answered yes to suicide question of "Have you ever done anything, started to do anything, or are you prepared to do anything." Suicide attempt in March or April of 2023. Patient was admitted to [facility]. Patient is on medication, has a psychiatrist, and has a therapist that she sees regularly. Patient denies needing any resources at this moment.

## 2022-04-28 NOTE — Interval H&P Note (Signed)
History and Physical Interval Note:  04/28/2022 1:45 PM  Lindsey Nicholson  has presented today for surgery, with the diagnosis of stress urinary incontinence.  The various methods of treatment have been discussed with the patient and family. After consideration of risks, benefits and other options for treatment, the patient has consented to  Procedure(s): TRANSVAGINAL TAPE (TVT) PROCEDURE----SINGLE INCISION SLING (Altis) (N/A) CYSTOSCOPY (N/A) as a surgical intervention.  The patient's history has been reviewed, patient examined, no change in status, stable for surgery.  I have reviewed the patient's chart and labs.  Questions were answered to the patient's satisfaction.     Jaquita Folds

## 2022-04-28 NOTE — Op Note (Signed)
Operative Note  Preoperative Diagnosis: stress urinary incontinence  Postoperative Diagnosis: same  Procedures performed:  Single incision sling (Altis), cystoscopy  Implants:  Implant Name Type Inv. Item Serial No. Manufacturer Lot No. LRB No. Used Action  SLING ALTIS SYSTEM - [id number] [id number] N/A 1 Implanted    Attending Surgeon: Sherlene Shams, MD  Anesthesia: General LMA  Findings: On cystoscopy, normal bladder and urethra without injury or lesion. Brisk bilateral ureteral efflux noted.    Specimens: none  Estimated blood loss: 30 mL  IV fluids: 100 mL  Urine output: 491 mL  Complications: none  Procedure in Detail: After informed consent was obtained, the patient was taken to the operating room where anesthesia was induced and found to be adequate. She was placed in dorsal lithotomy position, taking care to avoid any nerve  injury, and prepped and draped in the usual sterile fashion. A foley catheter was placed. A lonestar self-retraining retractor was placed with 4 stay hooks. The mid urethral area was located on the anterior vaginal wall.  Two Allis clamps were placed at the level of the midurethra. 1% lidocaine with epinephrine was injected into the vaginal mucosa. A vertical incision was made between the two clamps using a 15-blade scalpel.  Using sharp dissection, Metzenbaum scissors were used to make a periurethral tunnel from the vaginal incision towards the pubic rami bilaterally for the future sling tracts. The bladder was ensured to be empty. The trocar and attached sling were introduced into the right side of the periurethral vaginal incision, just inferior to the pubic rami. The trocar was guided through the endopelvic fascia with a cephalad drift then toward the obturator membrane in the 10 o'clock position. The trocar was given a quarter turn and the anchor was felt to engage into the obturator membrane. The trocar was  removed and this was repeated on the left side in a similar fashion.   A 70-degree cystoscope was introduced, and 360-degree inspection revealed no trauma or trocars in the bladder, with brisk bilateral ureteral efflux.  The bladder was drained and the cystoscope was removed.  The Foley catheter was reinserted.  The sling was tensioned with the prolene suture, enough to touch the urethral tissue but without tension. The prolene suture was trimmed. No sling perforation was noted in the vaginal sulcus. The vaginal mucosal edges were reapproximated using 2-0 Vicryl.  The vagina was copiously irrigated.  Hemostasis was again noted.  The patient tolerated the procedure well and was awoken and taken to the recovery room in stable condition. Needle and sponge counts were correct x2.   Jaquita Folds, MD

## 2022-04-28 NOTE — Anesthesia Preprocedure Evaluation (Addendum)
Anesthesia Evaluation  Patient identified by MRN, date of birth, ID band Patient awake    Reviewed: Allergy & Precautions, H&P , NPO status , Patient's Chart, lab work & pertinent test results  Airway Mallampati: III  TM Distance: >3 FB Neck ROM: Full    Dental no notable dental hx. (+) Teeth Intact, Dental Advisory Given, Chipped,    Pulmonary sleep apnea , Current Smoker and Patient abstained from smoking.   Pulmonary exam normal breath sounds clear to auscultation       Cardiovascular negative cardio ROS Normal cardiovascular exam Rhythm:Regular Rate:Normal     Neuro/Psych  Headaches PSYCHIATRIC DISORDERS Anxiety Depression Bipolar Disorder      GI/Hepatic Neg liver ROS,GERD  Medicated,,  Endo/Other  diabetes, Type 2Hypothyroidism  Morbid obesity  Renal/GU negative Renal ROS  negative genitourinary   Musculoskeletal  (+) Arthritis , Osteoarthritis,    Abdominal  (+) + obese  Peds  Hematology negative hematology ROS (+)   Anesthesia Other Findings   Reproductive/Obstetrics negative OB ROS                             Anesthesia Physical Anesthesia Plan  ASA: III  Anesthesia Plan: General   Post-op Pain Management: Dilaudid IV   Induction: Intravenous  PONV Risk Score and Plan: 2 and Ondansetron, Midazolam and Treatment may vary due to age or medical condition  Airway Management Planned: Oral ETT  Additional Equipment:   Intra-op Plan:   Post-operative Plan: Extubation in OR  Informed Consent: I have reviewed the patients History and Physical, chart, labs and discussed the procedure including the risks, benefits and alternatives for the proposed anesthesia with the patient or authorized representative who has indicated his/her understanding and acceptance.     Dental advisory given  Plan Discussed with: CRNA  Anesthesia Plan Comments:         Anesthesia Quick  Evaluation

## 2022-04-28 NOTE — Anesthesia Postprocedure Evaluation (Signed)
Anesthesia Post Note  Patient: Lindsey Nicholson  Procedure(s) Performed: TRANSVAGINAL TAPE (TVT) PROCEDURE----SINGLE INCISION SLING (Altis) (Vagina ) CYSTOSCOPY (Bladder)     Patient location during evaluation: PACU Anesthesia Type: General Level of consciousness: awake and alert Pain management: pain level controlled Vital Signs Assessment: post-procedure vital signs reviewed and stable Respiratory status: spontaneous breathing, nonlabored ventilation and respiratory function stable Cardiovascular status: blood pressure returned to baseline and stable Postop Assessment: no apparent nausea or vomiting Anesthetic complications: no   No notable events documented.  Last Vitals:  Vitals:   04/28/22 1521 04/28/22 1530  BP:  (!) 88/59  Pulse: 86 84  Resp: 18 14  Temp:    SpO2: 93% 93%    Last Pain:  Vitals:   04/28/22 1449  TempSrc:   PainSc: 0-No pain                 Caleyah Jr A.

## 2022-04-29 ENCOUNTER — Telehealth: Payer: Self-pay | Admitting: Obstetrics and Gynecology

## 2022-04-29 ENCOUNTER — Encounter (HOSPITAL_BASED_OUTPATIENT_CLINIC_OR_DEPARTMENT_OTHER): Payer: Self-pay | Admitting: Obstetrics and Gynecology

## 2022-04-29 NOTE — Telephone Encounter (Signed)
Lindsey Nicholson underwent single incision sling, cystoscopy on 04/28/22.   She passed her voiding trial.  337ml was backfilled into the bladder Voided 250ml  PVR by bladder scan was 36ml.   She was discharged without a catheter. Please call her for a routine post op check. Thanks!  Jaquita Folds, MD

## 2022-04-29 NOTE — Telephone Encounter (Signed)
Pain 4/10. Taking Robaxin and Tylenol for pain. Is resting and not moving around much due to pain.  Spotting blood.  Has had some minor pain in her throat.  Taking Miralax for her bowels and took a dulcolax stool softener to assist in a bowel movement.  Is having significantly less pain today than yesterday and is able to urinate.  Encouraged to call or send MyChart message if she has any questions or concerns.

## 2022-05-05 ENCOUNTER — Ambulatory Visit: Payer: Medicare Other | Admitting: Obstetrics and Gynecology

## 2022-05-08 ENCOUNTER — Telehealth: Payer: Self-pay | Admitting: Obstetrics and Gynecology

## 2022-05-08 DIAGNOSIS — N952 Postmenopausal atrophic vaginitis: Secondary | ICD-10-CM

## 2022-05-08 DIAGNOSIS — Z9889 Other specified postprocedural states: Secondary | ICD-10-CM

## 2022-05-08 MED ORDER — ESTRADIOL 0.1 MG/GM VA CREA: 0.5000 g | TOPICAL_CREAM | VAGINAL | 11 refills | Status: AC

## 2022-05-08 NOTE — Telephone Encounter (Signed)
Patient called stating that she was having a tugging sensation in the vagina. Patient reports she still gets sore in the vagina and has had some mucous like discharge. We discussed that this is more than likely the sutures dissolving and tissues healing. We had previously discussed her vaginal atrophy and she would benefit from vaginal estrogen cream. She reports she is open to trying that to promote tissue healing.  Reports she has had no leaking. Is still having some pain with sitting for long periods of time.

## 2022-05-12 ENCOUNTER — Ambulatory Visit: Payer: Medicare Other | Admitting: Obstetrics and Gynecology

## 2022-05-19 ENCOUNTER — Encounter: Payer: Self-pay | Admitting: *Deleted

## 2022-06-03 DIAGNOSIS — M7062 Trochanteric bursitis, left hip: Secondary | ICD-10-CM | POA: Diagnosis not present

## 2022-06-03 DIAGNOSIS — M7061 Trochanteric bursitis, right hip: Secondary | ICD-10-CM | POA: Diagnosis not present

## 2022-06-03 DIAGNOSIS — M47816 Spondylosis without myelopathy or radiculopathy, lumbar region: Secondary | ICD-10-CM | POA: Diagnosis not present

## 2022-06-03 DIAGNOSIS — M5136 Other intervertebral disc degeneration, lumbar region: Secondary | ICD-10-CM | POA: Diagnosis not present

## 2022-06-03 DIAGNOSIS — M48061 Spinal stenosis, lumbar region without neurogenic claudication: Secondary | ICD-10-CM | POA: Diagnosis not present

## 2022-06-06 DIAGNOSIS — E782 Mixed hyperlipidemia: Secondary | ICD-10-CM | POA: Diagnosis not present

## 2022-06-06 DIAGNOSIS — E119 Type 2 diabetes mellitus without complications: Secondary | ICD-10-CM | POA: Diagnosis not present

## 2022-06-06 DIAGNOSIS — Z7985 Long-term (current) use of injectable non-insulin antidiabetic drugs: Secondary | ICD-10-CM | POA: Diagnosis not present

## 2022-06-06 DIAGNOSIS — E039 Hypothyroidism, unspecified: Secondary | ICD-10-CM | POA: Diagnosis not present

## 2022-06-09 ENCOUNTER — Encounter: Payer: Medicare Other | Admitting: Obstetrics and Gynecology

## 2022-06-09 NOTE — Progress Notes (Deleted)
[facility]  Date of Visit: 06/09/2022  History of Present Illness: [name] is a 52 y.o. female scheduled today for a post-operative visit.   Surgery: s/p Single incision sling (Altis), cystoscopy on 04/28/22  She passed her postoperative void trial.   Postoperative course has been uncomplicated.   Today she reports ***  UTI in the last 6 weeks? {YES/NO:21197} Pain? {YES/NO:21197} She {HAS HAS [id number] returned to her normal activity (except for postop restrictions) Vaginal bulge? {YES/NO:21197} Stress incontinence: {YES/NO:21197} Urgency/frequency: {YES/NO:21197} Urge incontinence: {YES/NO:21197} Voiding dysfunction: {YES/NO:21197} Bowel issues: {YES/NO:21197}  Subjective Success: Do you usually have a bulge or something falling out that you can see or feel in the vaginal area? {YES/NO:21197} Retreatment Success: Any retreatment with surgery or pessary for any compartment? {YES/NO:21197}  Pathology results: ***  Medications: She has a current medication list which includes the following prescription(s): acetaminophen, amantadine, biotin, cetirizine, duloxetine, ergocalciferol, estradiol, hydroxyzine, hydroxyzine, levothyroxine, lumateperone tosylate, methocarbamol, multiple vitamins-minerals, oxymetazoline, pantoprazole, polyethylene glycol 3350, polyethylene glycol powder, promethazine, quetiapine, and mounjaro.   Allergies: Patient is allergic to abilify [aripiprazole], codeine, hydrocodone, oxycodone, tramadol, trazodone and nefazodone, latuda [lurasidone], and peach [prunus persica].   Physical Exam: There were no vitals taken for this visit.  Abdomen: {Exam; abdomen:14935::"soft, non-tender, without masses or organomegaly"} ***Incisions: {Exam; incision:13523}.  Pelvic Examination: Vagina: Incisions {Exam; incision:13523}. Sutures are {DESC; PRESENT/NOT PRESENT:21021351} at incision line and there {ACTION; IS/IS [id number] granulation tissue. No  tenderness along the anterior or posterior vagina. No apical tenderness. No pelvic masses. ***No visible or palpable mesh.  POP-Q:     No data to display         ---------------------------------------------------------  Assessment and Plan: No diagnosis found.  - ***Pathology results were reviewed with the patient today and she verbalized understanding that the results were ***benign.  - ***The patient was given a copy of her operative note ***and pathology report*** for her records. - Can resume regular activity including exercise and intercourse,  if desired.  - Discussed avoidance of heavy lifting and straining long term to reduce the risk of recurrance.   All questions answered.   No follow-ups on file.

## 2022-06-10 DIAGNOSIS — K2270 Barrett's esophagus without dysplasia: Secondary | ICD-10-CM | POA: Diagnosis not present

## 2022-06-12 DIAGNOSIS — Z9884 Bariatric surgery status: Secondary | ICD-10-CM | POA: Diagnosis not present

## 2022-06-12 DIAGNOSIS — K912 Postsurgical malabsorption, not elsewhere classified: Secondary | ICD-10-CM | POA: Diagnosis not present

## 2022-06-17 ENCOUNTER — Encounter: Payer: Medicare Other | Admitting: Obstetrics and Gynecology

## 2022-06-17 DIAGNOSIS — M5441 Lumbago with sciatica, right side: Secondary | ICD-10-CM | POA: Diagnosis not present

## 2022-06-17 DIAGNOSIS — E119 Type 2 diabetes mellitus without complications: Secondary | ICD-10-CM | POA: Diagnosis not present

## 2022-06-17 DIAGNOSIS — Z6841 Body Mass Index (BMI) 40.0 and over, adult: Secondary | ICD-10-CM | POA: Diagnosis not present

## 2022-06-19 DIAGNOSIS — K2270 Barrett's esophagus without dysplasia: Secondary | ICD-10-CM | POA: Diagnosis not present

## 2022-06-24 ENCOUNTER — Ambulatory Visit (INDEPENDENT_AMBULATORY_CARE_PROVIDER_SITE_OTHER): Payer: Medicare Other | Admitting: Obstetrics and Gynecology

## 2022-06-24 ENCOUNTER — Encounter: Payer: Self-pay | Admitting: Obstetrics and Gynecology

## 2022-06-24 VITALS — BP 104/72 | HR 96

## 2022-06-24 DIAGNOSIS — Z9889 Other specified postprocedural states: Secondary | ICD-10-CM

## 2022-06-24 NOTE — Progress Notes (Signed)
Lindsey Nicholson  Date of Visit: 06/24/2022  History of Present Illness: Ms. Drain is a 52 y.o. female scheduled today for a post-operative visit.   Surgery: s/p single incision sling (Altis), cystoscopy on 04/28/22  She passed her postoperative void trial.   Postoperative course has been uncomplicated.   Today she reports she has had no leakage but at least once a day feels that she does not empty well. She will void then have to go back and empties a good amount more.   UTI in the last 6 weeks? No  Pain? No  She has returned to her normal activity (except for postop restrictions) Vaginal bulge? Unsure- feels something poking out of the vagina occasionally.  Stress incontinence: No  Urgency/frequency: No  Urge incontinence: No  Voiding dysfunction:  unsure- occasional Bowel issues: No    Medications: She has a current medication list which includes the following prescription(s): amantadine, biotin, cetirizine, duloxetine, ergocalciferol, estradiol, hydroxyzine, hydroxyzine, levothyroxine, lumateperone tosylate, methocarbamol, multiple vitamins-minerals, oxymetazoline, pantoprazole, polyethylene glycol 3350, polyethylene glycol powder, promethazine, quetiapine, mounjaro, and acetaminophen.   Allergies: Patient is allergic to abilify [aripiprazole], codeine, hydrocodone, oxycodone, tramadol, trazodone and nefazodone, latuda [lurasidone], and peach [prunus persica].   Physical Exam: There were no vitals taken for this visit.   Pelvic Examination: Vagina: Incisions healing well. No sutures present. No tenderness along the anterior or posterior vagina. No apical tenderness. No pelvic masses. No visible or palpable mesh. Small piece of hymenal tissue protruding but otherwise nothing protruding from the vagina notable today.   POP-Q: Deferred, no prolapse noted.    PVR bladder scan- 74ml ---------------------------------------------------------  Assessment and Plan:  1.  Post-operative state     - Healing well.  - Overall good result from the sling. However has occasional issues with emptying. She knows how to self catheterize so she was given catheters today. Next time she feels that she is not emptying well, she can catheterize and measure amount. We discussed that PVR <150-224ml is within normal limits and is generally not concerning. She will send a message through MyChart with a few catheterized volumes.  - Can resume regular activity including exercise and intercourse,  swimming, etc.  Return as needed  Jaquita Folds, MD

## 2022-06-25 DIAGNOSIS — Z720 Tobacco use: Secondary | ICD-10-CM | POA: Diagnosis not present

## 2022-06-25 DIAGNOSIS — E669 Obesity, unspecified: Secondary | ICD-10-CM | POA: Diagnosis not present

## 2022-06-25 DIAGNOSIS — Z9884 Bariatric surgery status: Secondary | ICD-10-CM | POA: Diagnosis not present

## 2022-06-25 DIAGNOSIS — Z6839 Body mass index (BMI) 39.0-39.9, adult: Secondary | ICD-10-CM | POA: Diagnosis not present

## 2022-06-26 DIAGNOSIS — Z6841 Body Mass Index (BMI) 40.0 and over, adult: Secondary | ICD-10-CM | POA: Diagnosis not present

## 2022-06-26 DIAGNOSIS — R059 Cough, unspecified: Secondary | ICD-10-CM | POA: Diagnosis not present

## 2022-06-26 DIAGNOSIS — D72819 Decreased white blood cell count, unspecified: Secondary | ICD-10-CM | POA: Diagnosis not present

## 2022-06-29 DIAGNOSIS — K2270 Barrett's esophagus without dysplasia: Secondary | ICD-10-CM | POA: Diagnosis not present

## 2022-12-11 ENCOUNTER — Encounter (HOSPITAL_BASED_OUTPATIENT_CLINIC_OR_DEPARTMENT_OTHER): Payer: Self-pay | Admitting: Pediatrics

## 2022-12-11 ENCOUNTER — Emergency Department (HOSPITAL_BASED_OUTPATIENT_CLINIC_OR_DEPARTMENT_OTHER)
Admission: EM | Admit: 2022-12-11 | Discharge: 2022-12-11 | Discharge status: Against Medical Advice | Payer: Medicare Other | Attending: Emergency Medicine | Admitting: Emergency Medicine

## 2022-12-11 ENCOUNTER — Other Ambulatory Visit: Payer: Self-pay

## 2022-12-11 DIAGNOSIS — Z5321 Procedure and treatment not carried out due to patient leaving prior to being seen by health care provider: Secondary | ICD-10-CM | POA: Insufficient documentation

## 2022-12-11 DIAGNOSIS — R319 Hematuria, unspecified: Secondary | ICD-10-CM | POA: Diagnosis present

## 2022-12-11 DIAGNOSIS — R102 Pelvic and perineal pain: Secondary | ICD-10-CM | POA: Diagnosis not present

## 2022-12-11 LAB — COMPREHENSIVE METABOLIC PANEL
ALT: 37 U/L (ref 0–44)
AST: 40 U/L (ref 15–41)
Albumin: 4.1 g/dL (ref 3.5–5.0)
Alkaline Phosphatase: 114 U/L (ref 38–126)
Anion gap: 13 (ref 5–15)
BUN: 17 mg/dL (ref 6–20)
CO2: 25 mmol/L (ref 22–32)
Calcium: 10.1 mg/dL (ref 8.9–10.3)
Chloride: 100 mmol/L (ref 98–111)
Creatinine, Ser: 1.3 mg/dL — ABNORMAL HIGH (ref 0.44–1.00)
GFR, Estimated: 50 mL/min — ABNORMAL LOW (ref >60)
Glucose, Bld: 79 mg/dL (ref 70–99)
Potassium: 3.7 mmol/L (ref 3.5–5.1)
Sodium: 138 mmol/L (ref 135–145)
Total Bilirubin: 0.4 mg/dL (ref 0.3–1.2)
Total Protein: 8.3 g/dL — ABNORMAL HIGH (ref 6.5–8.1)

## 2022-12-11 LAB — URINALYSIS, ROUTINE W REFLEX MICROSCOPIC
Glucose, UA: NEGATIVE mg/dL
Ketones, ur: NEGATIVE mg/dL
Leukocytes,Ua: NEGATIVE
Nitrite: NEGATIVE
Protein, ur: 100 mg/dL — AB
Specific Gravity, Urine: >=1.03 (ref 1.005–1.030)
pH: 5.5 (ref 5.0–8.0)

## 2022-12-11 LAB — URINALYSIS, MICROSCOPIC (REFLEX): RBC / HPF: >50 RBC/hpf (ref 0–5)

## 2022-12-11 LAB — PREGNANCY, URINE: Preg Test, Ur: NEGATIVE

## 2022-12-11 MED ORDER — KETOROLAC TROMETHAMINE 60 MG/2ML IM SOLN: 30.0000 mg | Freq: Once | INTRAMUSCULAR | Status: DC

## 2022-12-11 NOTE — ED Triage Notes (Signed)
Reports started with blood in urine around 3am; and started having pain in the vaginal area, and "feels like waves of fire". States she had urine test earlier today with no signs of bacteria and a non contrast CT earlier today.

## 2022-12-11 NOTE — ED Notes (Signed)
Patient did not pull back enough blood for CMP Only CBC sent to lab Difficult stick

## 2023-09-23 ENCOUNTER — Encounter: Admitting: Urology

## 2023-10-16 ENCOUNTER — Other Ambulatory Visit: Payer: Self-pay | Admitting: Obstetrics and Gynecology

## 2023-10-16 DIAGNOSIS — N952 Postmenopausal atrophic vaginitis: Secondary | ICD-10-CM

## 2023-10-16 DIAGNOSIS — Z9889 Other specified postprocedural states: Secondary | ICD-10-CM

## 2023-11-25 IMAGING — CT CT ANGIO CHEST
2 of 8 series · 19 of 36 positions shown · IV contrast (Omnipaque)
Comparison: Chest x-ray 04/07/2021 CT 12/30/2019

CLINICAL DATA: Positive D-dimer with right-sided chest pain

EXAM:
CT ANGIOGRAPHY CHEST WITH CONTRAST
TECHNIQUE: Multidetector CT imaging of the chest was performed using the
standard protocol during bolus administration of intravenous
contrast. Multiplanar CT image reconstructions and MIPs were
obtained to evaluate the vascular anatomy.
CONTRAST:  100mL OMNIPAQUE IOHEXOL 350 MG/ML SOLN

[Series 6: pe coronal mpr · coronal · 0.67mm/px · 1 of 180 slices shown]
[im 90/180  mediastinal]
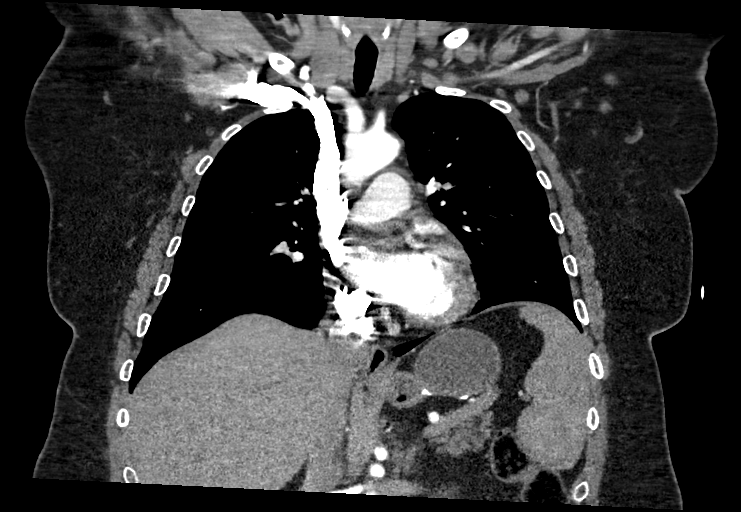

[Series 10: pe thins · axial · 0.98mm/px · z∈[-321,-33]mm · 18 of 322 slices shown]
[im 17/322  lung]
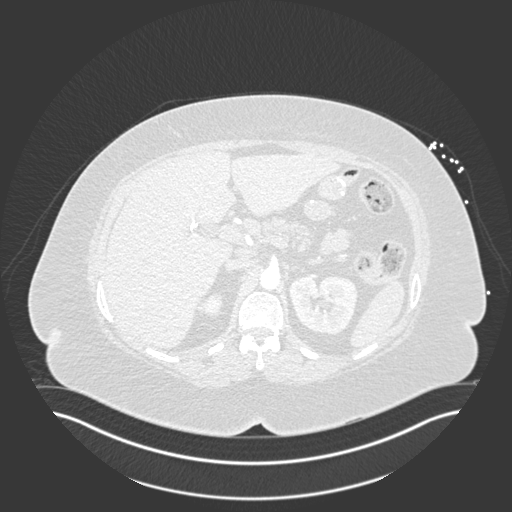
[im 34/322  mediastinal]
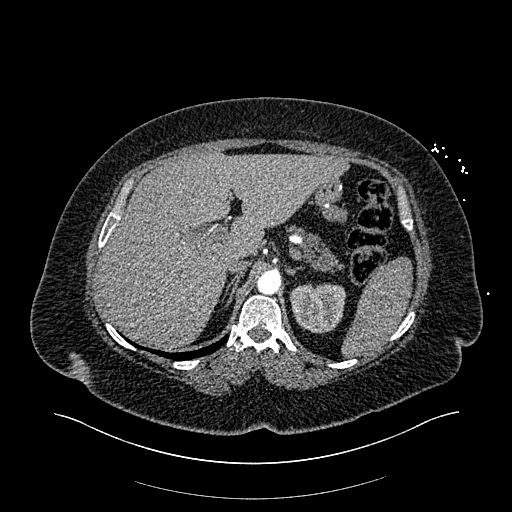
[im 51/322  lung]
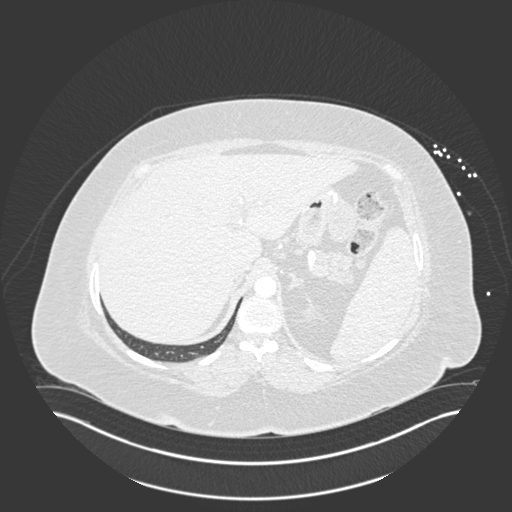
[im 68/322  mediastinal]
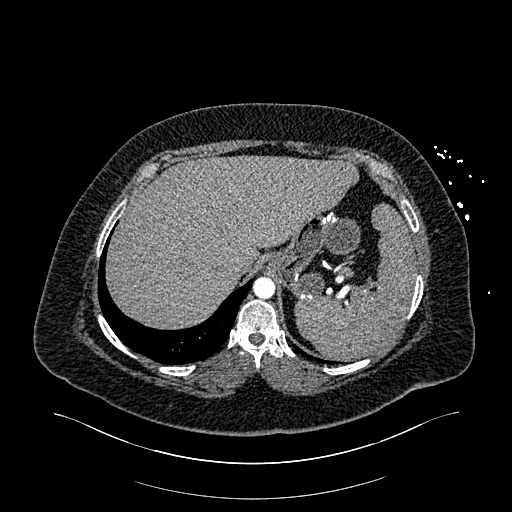
[im 85/322  lung]
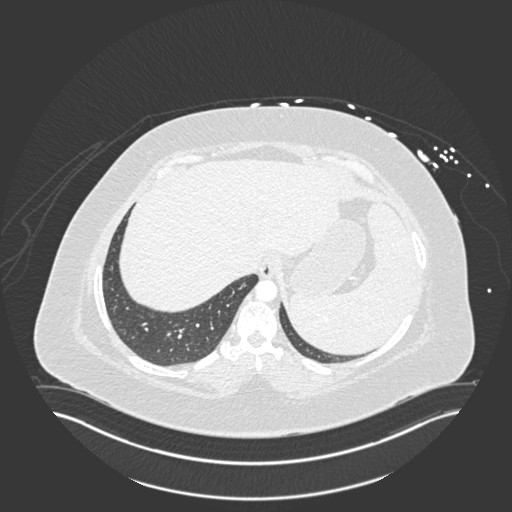
[im 102/322  mediastinal]
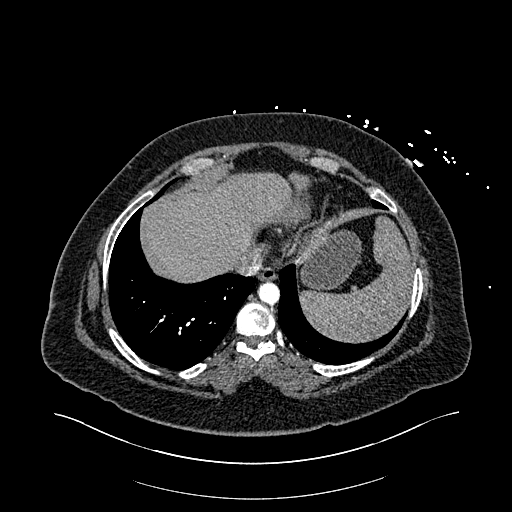
[im 119/322  lung]
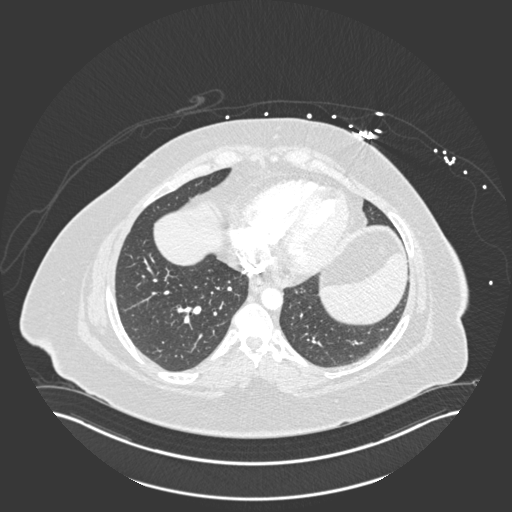
[im 136/322  mediastinal]
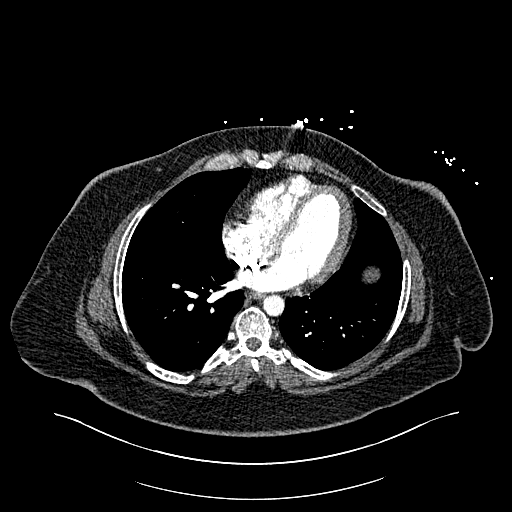
[im 153/322  lung]
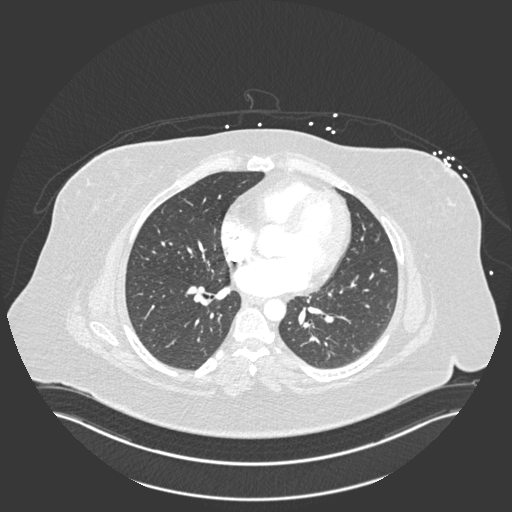
[im 169/322  mediastinal]
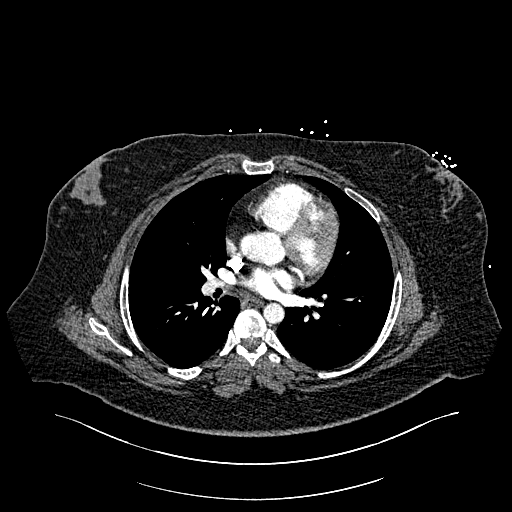
[im 186/322  lung]
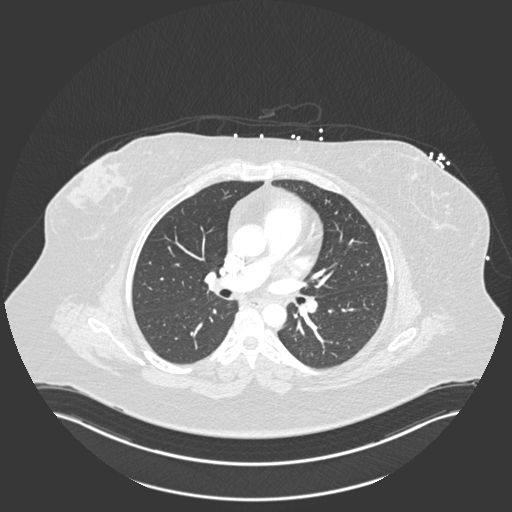
[im 203/322  mediastinal]
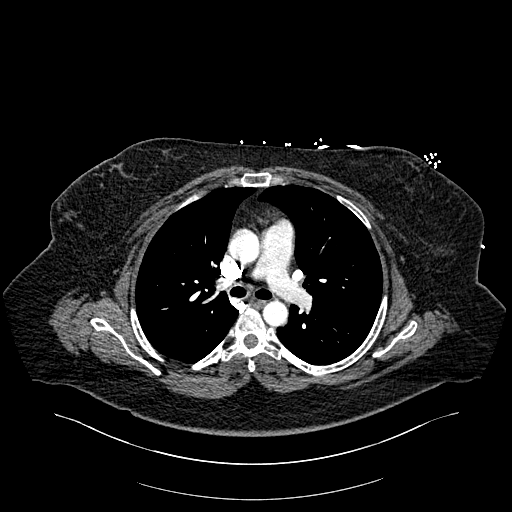
[im 220/322  lung]
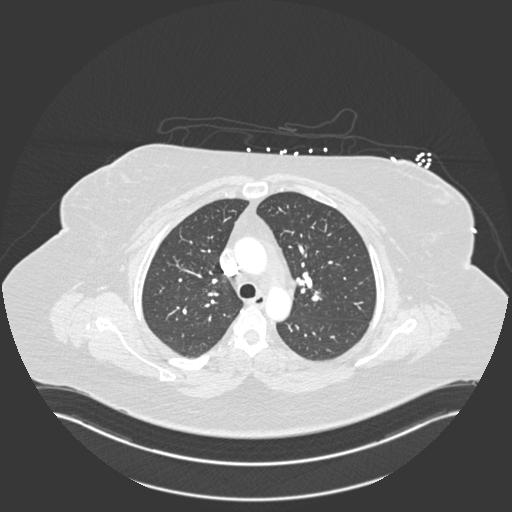
[im 237/322  mediastinal]
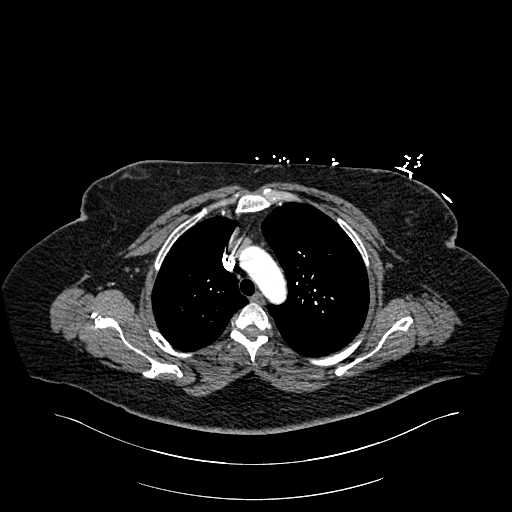
[im 254/322  lung]
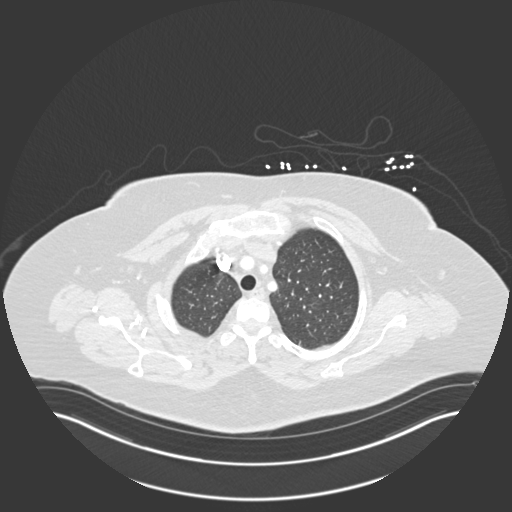
[im 271/322  mediastinal]
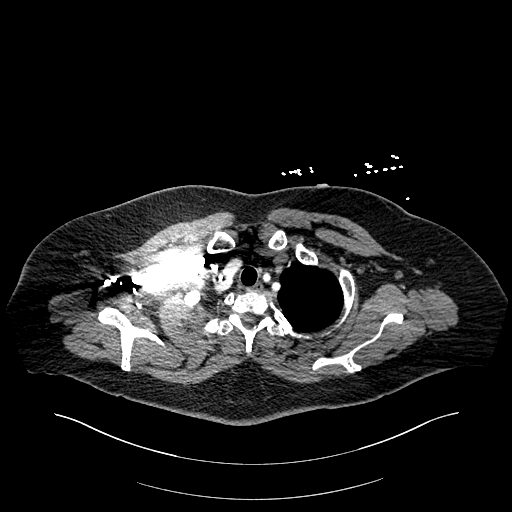
[im 288/322  lung]
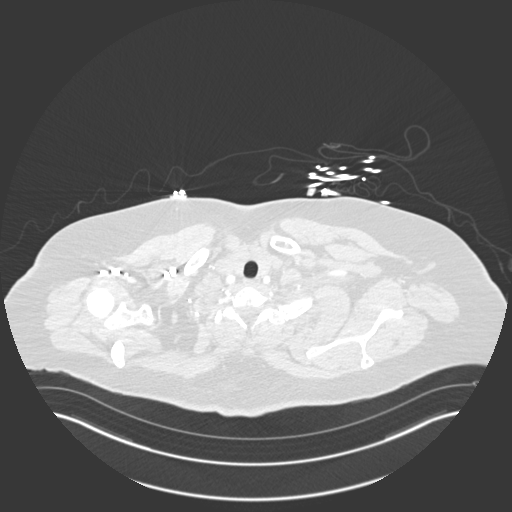
[im 305/322  mediastinal]
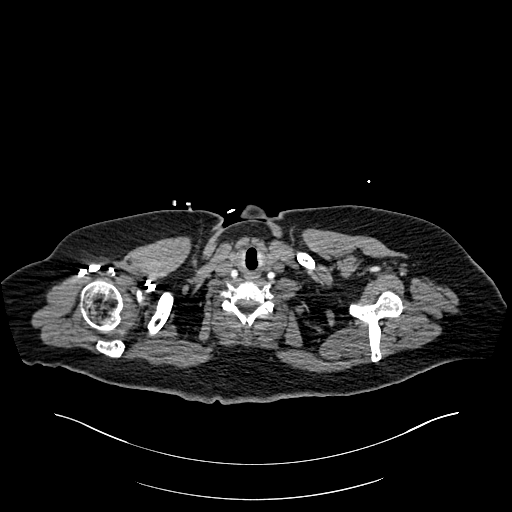

[19 of 36 positions shown; findings below may reference images not displayed]

FINDINGS: Cardiovascular: Slightly suboptimal opacification of the pulmonary
arterial system. No acute embolus is seen. Nonaneurysmal aorta. No
dissection. Normal cardiac size. No pericardial effusion.

Mediastinum/Nodes: No enlarged mediastinal, hilar, or axillary lymph
nodes. Thyroid gland, trachea, and esophagus demonstrate no
significant findings.

Lungs/Pleura: Lungs are clear. No pleural effusion or pneumothorax.

Upper Abdomen: Slightly enlarged spleen measuring 16 cm AP.
Postsurgical changes of the stomach suggesting prior gastric bypass.
Status post cholecystectomy. Prominent common bile duct without
change.

Musculoskeletal: No chest wall abnormality. No acute or significant
osseous findings.

Review of the MIP images confirms the above findings.
IMPRESSION: 1. Negative for acute pulmonary embolus.
2. The spleen appears slightly enlarged.

## 2023-11-25 IMAGING — DX DG CHEST 1V PORT
1 series · 1 of 1 positions shown · non-contrast
Comparison: 05/03/2018

CLINICAL DATA: Right-sided chest pain

EXAM:
PORTABLE CHEST 1 VIEW

[chest ap]
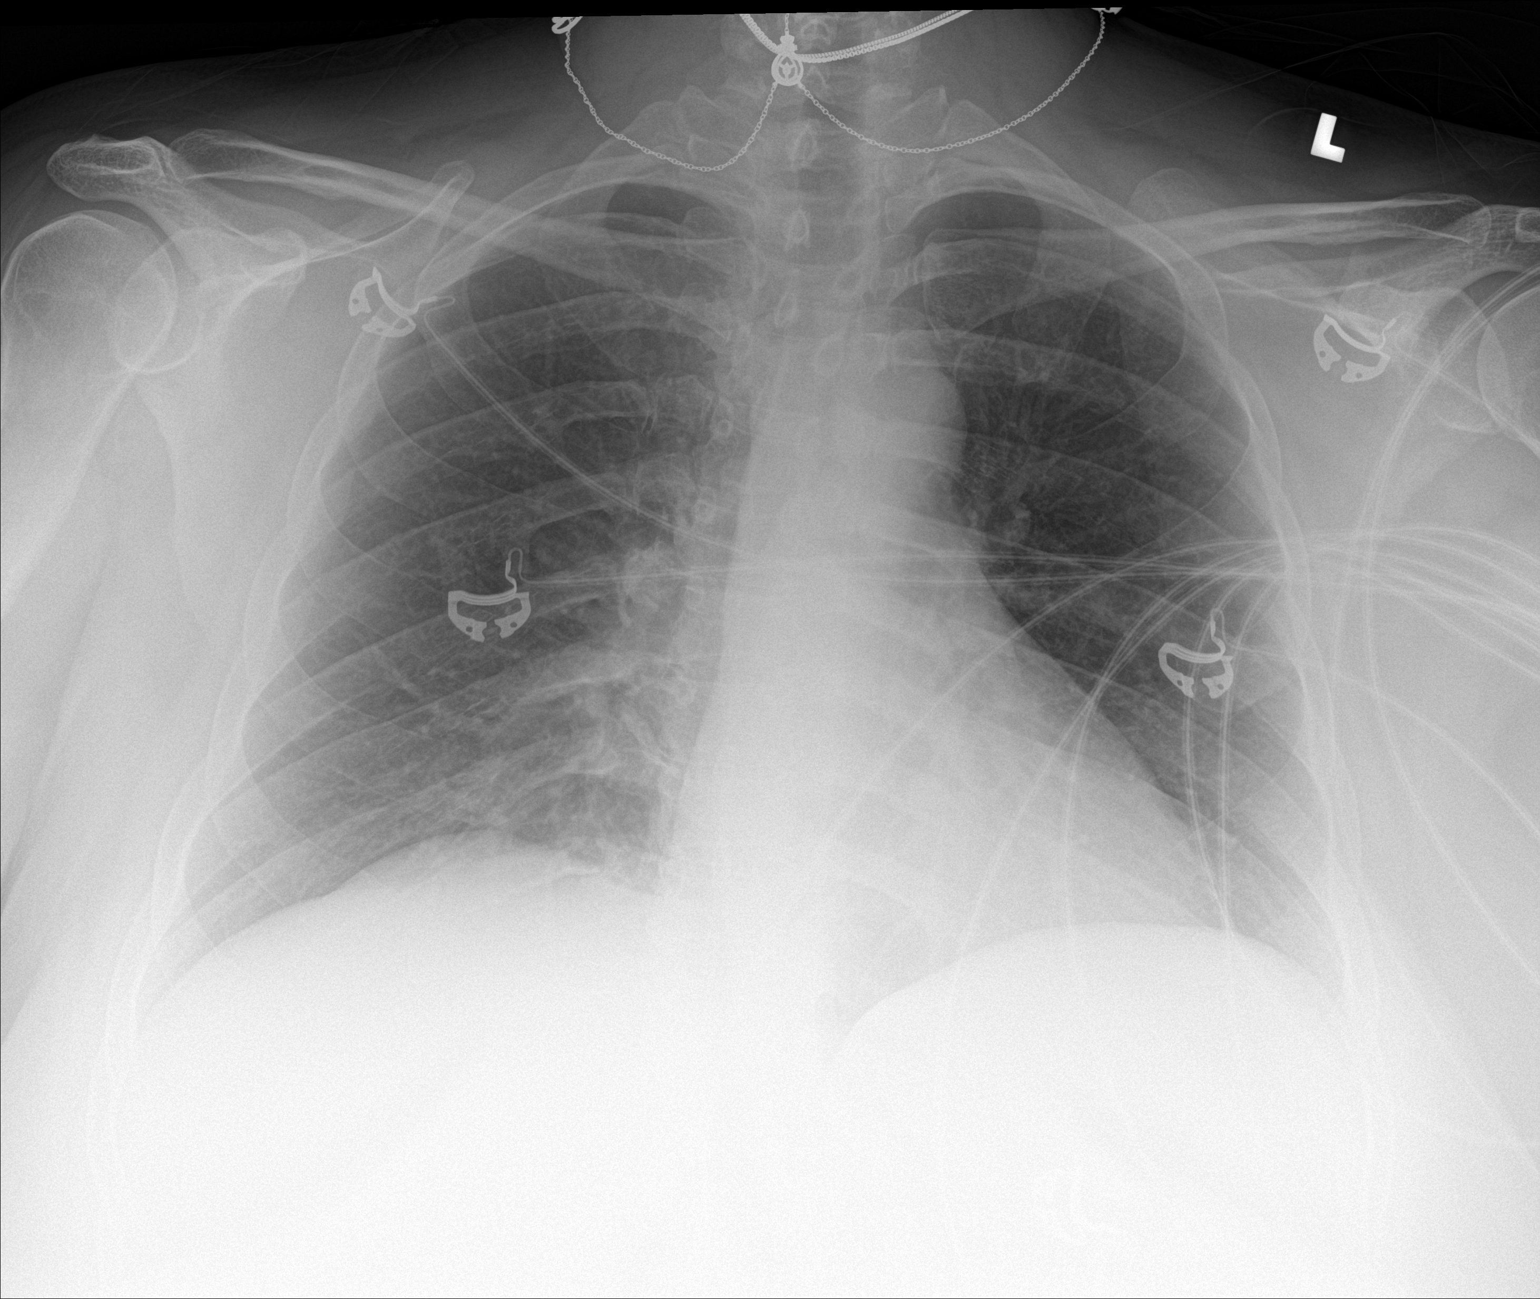

[1 of 1 positions shown; findings below may reference images not displayed]

FINDINGS: The heart size and mediastinal contours are within normal limits.
Both lungs are clear. The visualized skeletal structures are
unremarkable.
IMPRESSION: No active disease.

## 2024-04-11 ENCOUNTER — Encounter: Payer: Self-pay | Admitting: *Deleted
